# Patient Record
Sex: Female | Born: 1990 | Race: White | Hispanic: No | Marital: Single | Smoking: Current every day smoker
Health system: Southern US, Community
[De-identification: ages and names within clinical notes are randomized; demographics above are authoritative.]

## PROBLEM LIST (undated history)

## (undated) DIAGNOSIS — F32A Depression, unspecified: Secondary | ICD-10-CM

## (undated) DIAGNOSIS — F3281 Premenstrual dysphoric disorder: Secondary | ICD-10-CM

## (undated) DIAGNOSIS — F419 Anxiety disorder, unspecified: Secondary | ICD-10-CM

## (undated) DIAGNOSIS — N390 Urinary tract infection, site not specified: Secondary | ICD-10-CM

## (undated) DIAGNOSIS — F988 Other specified behavioral and emotional disorders with onset usually occurring in childhood and adolescence: Secondary | ICD-10-CM

## (undated) DIAGNOSIS — F319 Bipolar disorder, unspecified: Secondary | ICD-10-CM

## (undated) DIAGNOSIS — F909 Attention-deficit hyperactivity disorder, unspecified type: Secondary | ICD-10-CM

## (undated) DIAGNOSIS — F329 Major depressive disorder, single episode, unspecified: Secondary | ICD-10-CM

## (undated) HISTORY — DX: Major depressive disorder, single episode, unspecified: F32.9

## (undated) HISTORY — DX: Attention-deficit hyperactivity disorder, unspecified type: F90.9

## (undated) HISTORY — DX: Urinary tract infection, site not specified: N39.0

## (undated) HISTORY — DX: Bipolar disorder, unspecified: F31.9

## (undated) HISTORY — DX: Depression, unspecified: F32.A

## (undated) HISTORY — DX: Premenstrual dysphoric disorder: F32.81

## (undated) HISTORY — PX: TONSILLECTOMY: SUR1361

## (undated) HISTORY — DX: Other specified behavioral and emotional disorders with onset usually occurring in childhood and adolescence: F98.8

## (undated) HISTORY — DX: Anxiety disorder, unspecified: F41.9

---

## 2010-04-29 ENCOUNTER — Encounter: Payer: Self-pay | Admitting: Maternal & Fetal Medicine

## 2010-09-22 ENCOUNTER — Observation Stay: Payer: Self-pay

## 2010-10-18 ENCOUNTER — Inpatient Hospital Stay: Payer: Self-pay | Admitting: Obstetrics and Gynecology

## 2011-02-08 ENCOUNTER — Emergency Department: Payer: Self-pay | Admitting: *Deleted

## 2014-05-12 ENCOUNTER — Emergency Department (HOSPITAL_COMMUNITY): Payer: BLUE CROSS/BLUE SHIELD

## 2014-05-12 ENCOUNTER — Observation Stay (HOSPITAL_COMMUNITY): Payer: BLUE CROSS/BLUE SHIELD

## 2014-05-12 ENCOUNTER — Encounter (HOSPITAL_COMMUNITY): Payer: Self-pay | Admitting: Emergency Medicine

## 2014-05-12 ENCOUNTER — Observation Stay (HOSPITAL_COMMUNITY)
Admission: EM | Admit: 2014-05-12 | Discharge: 2014-05-14 | Disposition: A | Discharge status: Home / Self Care | Payer: BLUE CROSS/BLUE SHIELD | Attending: General Surgery | Admitting: General Surgery

## 2014-05-12 DIAGNOSIS — T07XXXA Unspecified multiple injuries, initial encounter: Secondary | ICD-10-CM

## 2014-05-12 DIAGNOSIS — M549 Dorsalgia, unspecified: Secondary | ICD-10-CM | POA: Insufficient documentation

## 2014-05-12 DIAGNOSIS — S62301A Unspecified fracture of second metacarpal bone, left hand, initial encounter for closed fracture: Secondary | ICD-10-CM | POA: Diagnosis not present

## 2014-05-12 DIAGNOSIS — D62 Acute posthemorrhagic anemia: Secondary | ICD-10-CM | POA: Diagnosis not present

## 2014-05-12 DIAGNOSIS — S92101A Unspecified fracture of right talus, initial encounter for closed fracture: Secondary | ICD-10-CM | POA: Diagnosis present

## 2014-05-12 DIAGNOSIS — S0081XA Abrasion of other part of head, initial encounter: Secondary | ICD-10-CM | POA: Diagnosis not present

## 2014-05-12 DIAGNOSIS — S22039A Unspecified fracture of third thoracic vertebra, initial encounter for closed fracture: Secondary | ICD-10-CM

## 2014-05-12 DIAGNOSIS — R52 Pain, unspecified: Secondary | ICD-10-CM

## 2014-05-12 DIAGNOSIS — S2231XA Fracture of one rib, right side, initial encounter for closed fracture: Secondary | ICD-10-CM | POA: Insufficient documentation

## 2014-05-12 DIAGNOSIS — T148XXA Other injury of unspecified body region, initial encounter: Secondary | ICD-10-CM

## 2014-05-12 DIAGNOSIS — S32029A Unspecified fracture of second lumbar vertebra, initial encounter for closed fracture: Principal | ICD-10-CM | POA: Insufficient documentation

## 2014-05-12 DIAGNOSIS — S32009A Unspecified fracture of unspecified lumbar vertebra, initial encounter for closed fracture: Secondary | ICD-10-CM | POA: Diagnosis present

## 2014-05-12 DIAGNOSIS — S92001A Unspecified fracture of right calcaneus, initial encounter for closed fracture: Secondary | ICD-10-CM | POA: Diagnosis not present

## 2014-05-12 DIAGNOSIS — S32039A Unspecified fracture of third lumbar vertebra, initial encounter for closed fracture: Secondary | ICD-10-CM

## 2014-05-12 DIAGNOSIS — S2239XA Fracture of one rib, unspecified side, initial encounter for closed fracture: Secondary | ICD-10-CM

## 2014-05-12 LAB — CBC WITH DIFFERENTIAL/PLATELET
Basophils Absolute: 0 10*3/uL (ref 0.0–0.1)
Basophils Relative: 0 % (ref 0–1)
Eosinophils Absolute: 0.1 10*3/uL (ref 0.0–0.7)
Eosinophils Relative: 1 % (ref 0–5)
HCT: 40.1 % (ref 36.0–46.0)
Hemoglobin: 13.4 g/dL (ref 12.0–15.0)
LYMPHS ABS: 4 10*3/uL (ref 0.7–4.0)
Lymphocytes Relative: 27 % (ref 12–46)
MCH: 24.5 pg — AB (ref 26.0–34.0)
MCHC: 33.4 g/dL (ref 30.0–36.0)
MCV: 73.3 fL — AB (ref 78.0–100.0)
MONO ABS: 0.6 10*3/uL (ref 0.1–1.0)
MONOS PCT: 4 % (ref 3–12)
NEUTROS ABS: 9.9 10*3/uL — AB (ref 1.7–7.7)
Neutrophils Relative %: 68 % (ref 43–77)
Platelets: 159 10*3/uL (ref 150–400)
RBC: 5.47 MIL/uL — ABNORMAL HIGH (ref 3.87–5.11)
RDW: 15.8 % — ABNORMAL HIGH (ref 11.5–15.5)
WBC: 14.6 10*3/uL — AB (ref 4.0–10.5)

## 2014-05-12 LAB — I-STAT BETA HCG BLOOD, ED (MC, WL, AP ONLY): I-stat hCG, quantitative: <5 m[IU]/mL (ref <5)

## 2014-05-12 LAB — COMPREHENSIVE METABOLIC PANEL
ALK PHOS: 77 U/L (ref 39–117)
ALT: 49 U/L — ABNORMAL HIGH (ref 0–35)
ANION GAP: 7 (ref 5–15)
AST: 78 U/L — ABNORMAL HIGH (ref 0–37)
Albumin: 3.8 g/dL (ref 3.5–5.2)
BILIRUBIN TOTAL: 0.5 mg/dL (ref 0.3–1.2)
BUN: 10 mg/dL (ref 6–23)
CHLORIDE: 107 mmol/L (ref 96–112)
CO2: 25 mmol/L (ref 19–32)
Calcium: 8.8 mg/dL (ref 8.4–10.5)
Creatinine, Ser: 0.78 mg/dL (ref 0.50–1.10)
GFR calc Af Amer: >90 mL/min (ref >90)
Glucose, Bld: 130 mg/dL — ABNORMAL HIGH (ref 70–99)
POTASSIUM: 3.5 mmol/L (ref 3.5–5.1)
Sodium: 139 mmol/L (ref 135–145)
Total Protein: 6.6 g/dL (ref 6.0–8.3)

## 2014-05-12 LAB — ETHANOL: Alcohol, Ethyl (B): <5 mg/dL (ref 0–9)

## 2014-05-12 LAB — PROTIME-INR
INR: 0.99 (ref 0.00–1.49)
PROTHROMBIN TIME: 13.2 s (ref 11.6–15.2)

## 2014-05-12 LAB — SAMPLE TO BLOOD BANK

## 2014-05-12 MED ORDER — TETANUS-DIPHTH-ACELL PERTUSSIS 5-2.5-18.5 LF-MCG/0.5 IM SUSP
0.5000 mL | Freq: Once | INTRAMUSCULAR | Status: AC
  Administered 2014-05-12: 0.5 mL via INTRAMUSCULAR

## 2014-05-12 MED ORDER — AMPHETAMINE-DEXTROAMPHET ER 10 MG PO CP24
20.0000 mg | ORAL_CAPSULE | Freq: Every day | ORAL | Status: DC
  Administered 2014-05-13 – 2014-05-14 (×2): 20 mg via ORAL
  Filled 2014-05-12 (×2): qty 2

## 2014-05-12 MED ORDER — IOHEXOL 300 MG/ML  SOLN
80.0000 mL | Freq: Once | INTRAMUSCULAR | Status: AC | PRN
  Administered 2014-05-12: 80 mL via INTRAVENOUS

## 2014-05-12 MED ORDER — MORPHINE SULFATE 4 MG/ML IJ SOLN
4.0000 mg | Freq: Once | INTRAMUSCULAR | Status: AC
  Administered 2014-05-12: 4 mg via INTRAVENOUS
  Filled 2014-05-12: qty 1

## 2014-05-12 MED ORDER — ENOXAPARIN SODIUM 40 MG/0.4ML ~~LOC~~ SOLN
40.0000 mg | SUBCUTANEOUS | Status: DC
  Administered 2014-05-12 – 2014-05-14 (×3): 40 mg via SUBCUTANEOUS
  Filled 2014-05-12 (×4): qty 0.4

## 2014-05-12 MED ORDER — ONDANSETRON HCL 4 MG/2ML IJ SOLN: 4.0000 mg | Freq: Four times a day (QID) | INTRAMUSCULAR | Status: DC | PRN

## 2014-05-12 MED ORDER — ONDANSETRON HCL 4 MG PO TABS: 4.0000 mg | ORAL_TABLET | Freq: Four times a day (QID) | ORAL | Status: DC | PRN

## 2014-05-12 MED ORDER — PANTOPRAZOLE SODIUM 40 MG IV SOLR
40.0000 mg | Freq: Every day | INTRAVENOUS | Status: DC
  Administered 2014-05-12: 40 mg via INTRAVENOUS
  Filled 2014-05-12: qty 40

## 2014-05-12 MED ORDER — KETOROLAC TROMETHAMINE 15 MG/ML IJ SOLN
15.0000 mg | Freq: Four times a day (QID) | INTRAMUSCULAR | Status: AC
  Administered 2014-05-12 – 2014-05-14 (×8): 15 mg via INTRAVENOUS
  Filled 2014-05-12 (×8): qty 1

## 2014-05-12 MED ORDER — ACETAMINOPHEN 325 MG PO TABS: 650.0000 mg | ORAL_TABLET | ORAL | Status: DC | PRN

## 2014-05-12 MED ORDER — KETOROLAC TROMETHAMINE 30 MG/ML IJ SOLN
30.0000 mg | Freq: Once | INTRAMUSCULAR | Status: AC
  Administered 2014-05-12: 30 mg via INTRAVENOUS
  Filled 2014-05-12: qty 1

## 2014-05-12 MED ORDER — BISACODYL 10 MG RE SUPP: 10.0000 mg | Freq: Every day | RECTAL | Status: DC | PRN

## 2014-05-12 MED ORDER — OXYCODONE HCL 5 MG PO TABS
10.0000 mg | ORAL_TABLET | ORAL | Status: DC | PRN
  Administered 2014-05-12 – 2014-05-14 (×10): 10 mg via ORAL
  Filled 2014-05-12 (×10): qty 2

## 2014-05-12 MED ORDER — MORPHINE SULFATE 2 MG/ML IJ SOLN
INTRAMUSCULAR | Status: AC
  Filled 2014-05-12: qty 2

## 2014-05-12 MED ORDER — FENTANYL CITRATE 0.05 MG/ML IJ SOLN
INTRAMUSCULAR | Status: AC
  Filled 2014-05-12: qty 2

## 2014-05-12 MED ORDER — SODIUM CHLORIDE 0.9 % IV SOLN
INTRAVENOUS | Status: DC
  Administered 2014-05-12 – 2014-05-13 (×2): via INTRAVENOUS

## 2014-05-12 MED ORDER — PANTOPRAZOLE SODIUM 40 MG PO TBEC
40.0000 mg | DELAYED_RELEASE_TABLET | Freq: Every day | ORAL | Status: DC
  Administered 2014-05-13 – 2014-05-14 (×2): 40 mg via ORAL
  Filled 2014-05-12 (×2): qty 1

## 2014-05-12 MED ORDER — TETANUS-DIPHTH-ACELL PERTUSSIS 5-2.5-18.5 LF-MCG/0.5 IM SUSP
INTRAMUSCULAR | Status: AC
  Administered 2014-05-12: 0.5 mL via INTRAMUSCULAR
  Filled 2014-05-12: qty 0.5

## 2014-05-12 MED ORDER — FENTANYL CITRATE 0.05 MG/ML IJ SOLN
50.0000 ug | Freq: Once | INTRAMUSCULAR | Status: AC
  Administered 2014-05-12: 50 ug via INTRAVENOUS

## 2014-05-12 MED ORDER — HYDROMORPHONE HCL 1 MG/ML IJ SOLN
1.0000 mg | INTRAMUSCULAR | Status: DC | PRN
  Administered 2014-05-12: 2 mg via INTRAVENOUS
  Filled 2014-05-12: qty 2

## 2014-05-12 MED ORDER — OXYCODONE HCL 5 MG PO TABS: 5.0000 mg | ORAL_TABLET | ORAL | Status: DC | PRN

## 2014-05-12 MED ORDER — DOCUSATE SODIUM 100 MG PO CAPS
100.0000 mg | ORAL_CAPSULE | Freq: Two times a day (BID) | ORAL | Status: DC
  Administered 2014-05-12 – 2014-05-14 (×4): 100 mg via ORAL
  Filled 2014-05-12 (×4): qty 1

## 2014-05-12 NOTE — ED Notes (Signed)
CT attempted to pick up patient, beta HCG still processing.  To call CT when results are in.

## 2014-05-12 NOTE — Progress Notes (Signed)
Talus fracture nonop.  Will order splint.  NWB RLE. Left 2nd metacarpal fx - will attempt nonop treatment.  Splint for now. Platform weight bearing.  F/u in office in 1 week for repeat xrays.  Mayra ReelN. Michael Xu, MD [facility] [phone] 5:56 PM

## 2014-05-12 NOTE — Progress Notes (Signed)
Pt reviewed by Dr Lindie SpruceWyatt at 2000 and a TLSO Brace was ordered, Ortho Tech paged and notified who promised to order in the morning for the pt to be fitted, will also come and splint pt's right ankle and left wrist, pt reassured, will however continue to monitor,call light within pt's reach, family at bedside. Obasogie-Asidi, Micole Delehanty Efe
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
## 2014-05-12 NOTE — ED Notes (Signed)
Portable xray at bedside.

## 2014-05-12 NOTE — Progress Notes (Signed)
Orthopedic Tech Progress Note Patient Details:  [name] GingerCheryl [name] Feb 15, 1991 [id number]  Ortho Devices Type of Ortho Device: Ace wrap, Volar splint, Lenora BoysWatson Jones splint Ortho Device/Splint Location: LUE dorsal volar splint, RLE watson jones Ortho Device/Splint Interventions: Ordered, Application   Jennye MoccasinHughes, Corry Ihnen Craig 05/12/2014, 10:12 PM
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
## 2014-05-12 NOTE — ED Notes (Signed)
Patient transported to X-ray 
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
## 2014-05-13 ENCOUNTER — Encounter (HOSPITAL_COMMUNITY): Payer: Self-pay | Admitting: *Deleted

## 2014-05-13 DIAGNOSIS — S32029A Unspecified fracture of second lumbar vertebra, initial encounter for closed fracture: Secondary | ICD-10-CM | POA: Diagnosis not present

## 2014-05-13 LAB — CBC
HCT: 33.5 % — ABNORMAL LOW (ref 36.0–46.0)
Hemoglobin: 11 g/dL — ABNORMAL LOW (ref 12.0–15.0)
MCH: 24.1 pg — AB (ref 26.0–34.0)
MCHC: 32.8 g/dL (ref 30.0–36.0)
MCV: 73.5 fL — AB (ref 78.0–100.0)
Platelets: 121 10*3/uL — ABNORMAL LOW (ref 150–400)
RBC: 4.56 MIL/uL (ref 3.87–5.11)
RDW: 15.9 % — ABNORMAL HIGH (ref 11.5–15.5)
WBC: 7.1 10*3/uL (ref 4.0–10.5)

## 2014-05-13 LAB — BASIC METABOLIC PANEL
Anion gap: 6 (ref 5–15)
BUN: <5 mg/dL — ABNORMAL LOW (ref 6–23)
CHLORIDE: 106 mmol/L (ref 96–112)
CO2: 24 mmol/L (ref 19–32)
Calcium: 7.7 mg/dL — ABNORMAL LOW (ref 8.4–10.5)
Creatinine, Ser: 0.58 mg/dL (ref 0.50–1.10)
GFR calc Af Amer: >90 mL/min (ref >90)
GFR calc non Af Amer: >90 mL/min (ref >90)
Glucose, Bld: 95 mg/dL (ref 70–99)
Potassium: 3.8 mmol/L (ref 3.5–5.1)
Sodium: 136 mmol/L (ref 135–145)

## 2014-05-13 MED ORDER — METHOCARBAMOL 750 MG PO TABS
750.0000 mg | ORAL_TABLET | Freq: Three times a day (TID) | ORAL | Status: DC | PRN
  Administered 2014-05-13 – 2014-05-14 (×2): 750 mg via ORAL
  Filled 2014-05-13 (×2): qty 1

## 2014-05-13 MED ORDER — INFLUENZA VAC SPLIT QUAD 0.5 ML IM SUSY
0.5000 mL | PREFILLED_SYRINGE | INTRAMUSCULAR | Status: DC
  Filled 2014-05-13: qty 0.5

## 2014-05-13 MED ORDER — HYDROMORPHONE HCL 1 MG/ML IJ SOLN: 1.0000 mg | INTRAMUSCULAR | Status: DC | PRN

## 2014-05-13 MED ORDER — BACITRACIN-NEOMYCIN-POLYMYXIN OINTMENT TUBE
1.0000 "application " | TOPICAL_OINTMENT | Freq: Two times a day (BID) | CUTANEOUS | Status: DC
  Administered 2014-05-13 (×2): 1 via TOPICAL
  Filled 2014-05-13: qty 15
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
## 2014-05-13 NOTE — Progress Notes (Signed)
UR completed 
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
## 2014-05-14 DIAGNOSIS — T07XXXA Unspecified multiple injuries, initial encounter: Secondary | ICD-10-CM

## 2014-05-14 DIAGNOSIS — S0081XA Abrasion of other part of head, initial encounter: Secondary | ICD-10-CM | POA: Diagnosis present

## 2014-05-14 DIAGNOSIS — S32029A Unspecified fracture of second lumbar vertebra, initial encounter for closed fracture: Secondary | ICD-10-CM | POA: Diagnosis not present

## 2014-05-14 DIAGNOSIS — S92101A Unspecified fracture of right talus, initial encounter for closed fracture: Secondary | ICD-10-CM | POA: Diagnosis present

## 2014-05-14 DIAGNOSIS — S92001A Unspecified fracture of right calcaneus, initial encounter for closed fracture: Secondary | ICD-10-CM | POA: Diagnosis present

## 2014-05-14 DIAGNOSIS — S62301A Unspecified fracture of second metacarpal bone, left hand, initial encounter for closed fracture: Secondary | ICD-10-CM | POA: Diagnosis present

## 2014-05-14 LAB — CBC
HEMATOCRIT: 32.8 % — AB (ref 36.0–46.0)
Hemoglobin: 10.8 g/dL — ABNORMAL LOW (ref 12.0–15.0)
MCH: 24.1 pg — AB (ref 26.0–34.0)
MCHC: 32.9 g/dL (ref 30.0–36.0)
MCV: 73.2 fL — AB (ref 78.0–100.0)
Platelets: 111 10*3/uL — ABNORMAL LOW (ref 150–400)
RBC: 4.48 MIL/uL (ref 3.87–5.11)
RDW: 15.7 % — AB (ref 11.5–15.5)
WBC: 6.9 10*3/uL (ref 4.0–10.5)

## 2014-05-14 MED ORDER — DOCUSATE SODIUM 100 MG PO CAPS: 100.0000 mg | ORAL_CAPSULE | Freq: Two times a day (BID) | ORAL | Status: DC | PRN

## 2014-05-14 MED ORDER — ACETAMINOPHEN 325 MG PO TABS: 650.0000 mg | ORAL_TABLET | ORAL | Status: DC | PRN

## 2014-05-14 MED ORDER — OXYCODONE HCL 5 MG PO TABS: 5.0000 mg | ORAL_TABLET | Freq: Four times a day (QID) | ORAL | Status: DC | PRN

## 2014-05-14 MED ORDER — METHOCARBAMOL 750 MG PO TABS: 750.0000 mg | ORAL_TABLET | Freq: Three times a day (TID) | ORAL | Status: DC | PRN
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
## 2014-05-14 NOTE — Progress Notes (Signed)
Patient ready for discharge home today; equipment delivered to room; discharge instructions given and reviewed with patient; Rx's and notes for work and childcare given to patient; patient discharged accompanied by her friend.
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
## 2014-05-14 NOTE — Discharge Instructions (Signed)
Continue left hand and right lower leg splints.   Follow up with Ortho (1week) and Neurosurgery (3 weeks) as recommended. Wear your turtle shell brace, don and doff in bed prior to ambulating Take a stool softener as needed.  Thoracolumbosacral Orthosis Brace A thoracolumbosacral orthosis (TLSO) brace can be worn for different purposes. A TLSO brace can be worn to support the back. Your back may need more support if you had a broken bone in your back (fractured vertebrae), back surgery, or you cannot move (paralysis) your torso muscles. A TLSO brace can also be worn by a child to help prevent curving back problems from getting worse as the child grows. TLSO braces are used to limit bending and twisting. The braces are made from a hard plastic. They are custom fit for each wearer. The TLSO brace covers both the front and back of the torso. On the back, the brace reaches from just above the tailbone to just below the shoulder blades. On the front, the brace covers the ribs and often reaches past the waist and over the hips. The brace can be worn under clothing.  HOME CARE INSTRUCTIONS  Ask your caregiver if you can remove your brace when showering or sleeping.  Follow your caregiver's instructions for putting on your brace.  Follow your caregiver's instructions about how much weight you can lift.  Always wear a T-shirt under the brace.  Make sure the brace is always well-aligned and fits you closely.  Check your skin daily for sores and redness caused by rubbing or pressure.  If you feel unsteady, use a cane or walker for support until you feel more steady.  Sit in high, firm chairs. It may be difficult to stand up from low, soft chairs.  Keep all follow-up appointments as directed by your caregiver. SEEK IMMEDIATE MEDICAL CARE IF:  You have a fever.  You have shortness of breath.  You have chest pain.  You have numbness, tingling, or pain. Developed in conjunction with the  [facility]. Document Released: 01/23/2011 Document Revised: 04/28/2011 Document Reviewed: 01/23/2011 [facility] Patient Information 2015 [place] [place]. This information is not intended to replace advice given to you by your health care provider. Make sure you discuss any questions you have with your health care provider.
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
## 2014-08-08 ENCOUNTER — Ambulatory Visit: Payer: Self-pay | Admitting: Psychiatry

## 2014-09-07 ENCOUNTER — Encounter: Payer: Self-pay | Admitting: Psychiatry

## 2014-09-07 ENCOUNTER — Ambulatory Visit (INDEPENDENT_AMBULATORY_CARE_PROVIDER_SITE_OTHER): Payer: BLUE CROSS/BLUE SHIELD | Admitting: Psychiatry

## 2014-09-07 ENCOUNTER — Ambulatory Visit (INDEPENDENT_AMBULATORY_CARE_PROVIDER_SITE_OTHER): Payer: BLUE CROSS/BLUE SHIELD | Admitting: Licensed Clinical Social Worker

## 2014-09-07 VITALS — BP 110/72 | HR 78 | Temp 97.8°F | Ht 62.0 in | Wt 160.8 lb

## 2014-09-07 DIAGNOSIS — F3181 Bipolar II disorder: Secondary | ICD-10-CM

## 2014-09-07 DIAGNOSIS — F39 Unspecified mood [affective] disorder: Secondary | ICD-10-CM | POA: Insufficient documentation

## 2014-09-07 MED ORDER — ARIPIPRAZOLE 15 MG PO TABS: 15.0000 mg | ORAL_TABLET | Freq: Every day | ORAL | Status: DC
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
## 2014-09-08 DIAGNOSIS — F3181 Bipolar II disorder: Secondary | ICD-10-CM | POA: Insufficient documentation

## 2014-09-14 ENCOUNTER — Ambulatory Visit (INDEPENDENT_AMBULATORY_CARE_PROVIDER_SITE_OTHER): Payer: BLUE CROSS/BLUE SHIELD | Admitting: Licensed Clinical Social Worker

## 2014-09-14 ENCOUNTER — Other Ambulatory Visit
Admission: RE | Admit: 2014-09-14 | Discharge: 2014-09-14 | Disposition: A | Discharge status: Home / Self Care | Payer: BLUE CROSS/BLUE SHIELD | Source: Ambulatory Visit | Attending: Psychiatry | Admitting: Psychiatry

## 2014-09-14 DIAGNOSIS — F319 Bipolar disorder, unspecified: Secondary | ICD-10-CM | POA: Diagnosis not present

## 2014-09-14 DIAGNOSIS — F3181 Bipolar II disorder: Secondary | ICD-10-CM | POA: Diagnosis not present

## 2014-09-14 LAB — LIPID PANEL
CHOL/HDL RATIO: 3 ratio
Cholesterol: 153 mg/dL (ref 0–200)
HDL: 51 mg/dL (ref >40)
LDL CALC: 88 mg/dL (ref 0–99)
TRIGLYCERIDES: 69 mg/dL (ref <150)
VLDL: 14 mg/dL (ref 0–40)

## 2014-09-14 LAB — GLUCOSE, RANDOM: Glucose, Bld: 92 mg/dL (ref 65–99)

## 2014-09-14 NOTE — Progress Notes (Signed)
THERAPIST PROGRESS NOTE  Session Time: 10:55 a.m. -   Participation Level: Active  Behavioral Response: somewhat provactive clothing; skin esposureAlertEuthymic  Type of Therapy: Individual Therapy  Treatment Goals addressed: Coping  Interventions: CBT, Motivational Interviewing, Solution Focused, Strength-based, Supportive and Reframing  Summary: [name] [name] is a 24 y.o. female who presents with diagnosis as per Dr. Jimmye Norman of Bi-Polar II Disorder.  Client reports feeling more tired along with "I am near binge eating" and increased appetite, having back aches and stated "I don't have a lot of energy."  She started Abilify three days ago.  "I feel an evenness with my temperament. I'm finding my-self reaction less to things." "I haven't had any freak outs." Is hoping that she can restart Adderall soon due to difficulty with focusing.  She discussed how she has been an emotional eater in the past.  Denied feeling depressed yet continues to have interrupted sleep and may get 6 hours of sleep at night.  Overall, she was more slowed in her movements and speech somewhat less pressured yet she remains hyper-verbal during session and thoughts are still somewhat tangential.  Denies feeling either depressed or manic.  Reported that it is her reaction to things in her parents' home that have resulted in negative experiences which led her to begin talking to therapist  During session she was more positive about living situation with her parents compared to other sessions.  Client discussed relationships with Thurmond Butts, boyfriend who is the only father figure that son, Dory Larsen, knows since the biological father does not support the son. Malachy Mood reviewed with LCSW those areas of her life that are causing internal stress as it relates to how she is getting her needs met through self described 2 and a half boyfriends.  She reported that she is only sexually active with the one whom is the father figure for  her son and whom she has been in a relationship with for around four years now.  Jena reports that the other two are more less there for her to "complain to" and for emotional support and attention.  She discussed her own motives in having multiple relationships and is motivated to change this pattern of behaviors and Zelena discussed increasing insight into how this is having a negative impact on her life.  "I have three goals: To get an attorney to get sole custody of my son, to rip my teeth out or get a reliable vehicle."  Client asking LCSW for input stating she doesn't know which goal to take care of first.  Maily was receptive to LCSW's support and feedback today.  Suicidal/Homicidal: Negativewithout intent/plan  Therapist Response:   Supportive therapy with insight was provided and validated client's desires to make changes in her life and promote healthier inter-personal relationships.  Reassured her that therapy could focus on identifying toxic characteristics both in herself and in others in her life along with the role of certain negative thinking patterns with emphasis on re-framing what is believed. Assisted client to begin to increase insight and identification into patterns of certain behaviors and the resulting consequences.  Reinforced importance of self-care and urged to have her teeth/dental problems taken care of first to prevent further health problems.  Plan: Return again in two weeks.  Client will complete readings/hand-outs for discussion at next session.  LCSW will assess additional therapy goals/needs and coordinate client's care with Dr. Jimmye Norman.  Diagnosis: Bi-Polar 2 Disorder   Rule Out Borderline Personality Disorder    Otila Kluver  Nani Skillern 09/14/2014

## 2014-09-15 LAB — PROLACTIN: Prolactin: 4.1 ng/mL — ABNORMAL LOW (ref 4.8–23.3)

## 2014-10-12 ENCOUNTER — Ambulatory Visit (INDEPENDENT_AMBULATORY_CARE_PROVIDER_SITE_OTHER): Payer: BLUE CROSS/BLUE SHIELD | Admitting: Licensed Clinical Social Worker

## 2014-10-12 ENCOUNTER — Ambulatory Visit (INDEPENDENT_AMBULATORY_CARE_PROVIDER_SITE_OTHER): Payer: BLUE CROSS/BLUE SHIELD | Admitting: Psychiatry

## 2014-10-12 ENCOUNTER — Ambulatory Visit: Payer: Self-pay | Admitting: Family Medicine

## 2014-10-12 ENCOUNTER — Encounter: Payer: Self-pay | Admitting: Psychiatry

## 2014-10-12 VITALS — BP 122/78 | HR 100 | Temp 98.5°F | Ht 62.0 in | Wt 167.2 lb

## 2014-10-12 DIAGNOSIS — F900 Attention-deficit hyperactivity disorder, predominantly inattentive type: Secondary | ICD-10-CM

## 2014-10-12 DIAGNOSIS — M545 Low back pain, unspecified: Secondary | ICD-10-CM | POA: Insufficient documentation

## 2014-10-12 DIAGNOSIS — IMO0001 Reserved for inherently not codable concepts without codable children: Secondary | ICD-10-CM | POA: Insufficient documentation

## 2014-10-12 DIAGNOSIS — N760 Acute vaginitis: Secondary | ICD-10-CM | POA: Insufficient documentation

## 2014-10-12 DIAGNOSIS — J309 Allergic rhinitis, unspecified: Secondary | ICD-10-CM | POA: Insufficient documentation

## 2014-10-12 DIAGNOSIS — I889 Nonspecific lymphadenitis, unspecified: Secondary | ICD-10-CM | POA: Insufficient documentation

## 2014-10-12 DIAGNOSIS — B354 Tinea corporis: Secondary | ICD-10-CM | POA: Insufficient documentation

## 2014-10-12 DIAGNOSIS — J019 Acute sinusitis, unspecified: Secondary | ICD-10-CM | POA: Insufficient documentation

## 2014-10-12 DIAGNOSIS — J020 Streptococcal pharyngitis: Secondary | ICD-10-CM | POA: Insufficient documentation

## 2014-10-12 DIAGNOSIS — N946 Dysmenorrhea, unspecified: Secondary | ICD-10-CM | POA: Insufficient documentation

## 2014-10-12 DIAGNOSIS — K529 Noninfective gastroenteritis and colitis, unspecified: Secondary | ICD-10-CM | POA: Insufficient documentation

## 2014-10-12 DIAGNOSIS — L739 Follicular disorder, unspecified: Secondary | ICD-10-CM | POA: Insufficient documentation

## 2014-10-12 DIAGNOSIS — F329 Major depressive disorder, single episode, unspecified: Secondary | ICD-10-CM | POA: Insufficient documentation

## 2014-10-12 DIAGNOSIS — F4322 Adjustment disorder with anxiety: Secondary | ICD-10-CM | POA: Insufficient documentation

## 2014-10-12 DIAGNOSIS — J111 Influenza due to unidentified influenza virus with other respiratory manifestations: Secondary | ICD-10-CM | POA: Insufficient documentation

## 2014-10-12 DIAGNOSIS — F3181 Bipolar II disorder: Secondary | ICD-10-CM

## 2014-10-12 DIAGNOSIS — F32A Depression, unspecified: Secondary | ICD-10-CM | POA: Insufficient documentation

## 2014-10-12 DIAGNOSIS — R350 Frequency of micturition: Secondary | ICD-10-CM | POA: Insufficient documentation

## 2014-10-12 DIAGNOSIS — M19171 Post-traumatic osteoarthritis, right ankle and foot: Secondary | ICD-10-CM | POA: Insufficient documentation

## 2014-10-12 DIAGNOSIS — G4700 Insomnia, unspecified: Secondary | ICD-10-CM | POA: Insufficient documentation

## 2014-10-12 DIAGNOSIS — S62609A Fracture of unspecified phalanx of unspecified finger, initial encounter for closed fracture: Secondary | ICD-10-CM | POA: Insufficient documentation

## 2014-10-12 DIAGNOSIS — L709 Acne, unspecified: Secondary | ICD-10-CM | POA: Insufficient documentation

## 2014-10-12 DIAGNOSIS — N390 Urinary tract infection, site not specified: Secondary | ICD-10-CM | POA: Insufficient documentation

## 2014-10-12 DIAGNOSIS — B349 Viral infection, unspecified: Secondary | ICD-10-CM | POA: Insufficient documentation

## 2014-10-12 MED ORDER — AMPHETAMINE-DEXTROAMPHET ER 10 MG PO CP24: 10.0000 mg | ORAL_CAPSULE | ORAL | Status: DC

## 2014-10-12 MED ORDER — ARIPIPRAZOLE 15 MG PO TABS: 15.0000 mg | ORAL_TABLET | Freq: Every day | ORAL | Status: DC

## 2014-10-12 NOTE — Progress Notes (Signed)
BH MD/PA/NP OP Progress Note  10/12/2014 11:03 AM [name] [name]  MRN:  [id number]  Subjective:  Patient returns a follow-up or bipolar 2 and ADHD. She states she feels "even." She states since being on the Abilify at her visit one month ago she feels like her mood is been even she's not up she's not down. She states the only problem she continues to have is not completing things and not being able to remain focus. She states her sleep is good. States her mood is good. She states today her focus will be parent for her son's birthday party which they're having this weekend even though his birthdate is actually on [birth year]. We discussed her metabolic labs which were all within normal limits. She denies any side effects on the medications. She is interested in restarting her stimulant and feels like this has been helpful to her in the past. We discussed taking note to any symptoms that it could be making her more irritable and agitated related to bipolar and she was aware of this. Chief Complaint:  Chief Complaint    Follow-up; Medication Refill     Visit Diagnosis:  No diagnosis found.  Past Medical History:  Past Medical History  Diagnosis Date  . ADHD (attention deficit hyperactivity disorder)   . Depression   . Anxiety   . Bipolar disorder     Past Surgical History  Procedure Laterality Date  . Tonsillectomy     Family History:  Family History  Problem Relation Age of Onset  . Hypertension Mother   . Diabetes Mother   . Anxiety disorder Mother   . Asthma Mother   . Diabetes Father   . Hypertension Father   . Alcohol abuse Father   . Depression Father   . Arthritis Father   . Arthritis Sister   . Anxiety disorder Brother   . Hypertension Brother   . Arthritis Sister   . Arthritis Sister   . Asthma Sister    Social History:  Social History   Social History  . Marital Status: Single    Spouse Name: N/A  . Number of Children: N/A  . Years of Education: N/A   Social  History Main Topics  . Smoking status: Current Every Day Smoker -- 0.50 packs/day    Types: Cigarettes    Start date: 09/06/2009  . Smokeless tobacco: Never Used  . Alcohol Use: 2.4 oz/week    0 Standard drinks or equivalent, 4 Glasses of wine per week  . Drug Use: No  . Sexual Activity: Yes    Birth Control/ Protection: Implant   Other Topics Concern  . None   Social History Narrative   Additional History:   Assessment:   Musculoskeletal: Strength & Muscle Tone: within normal limits Gait & Station: normal Patient leans: N/A  Psychiatric Specialty Exam: HPI  Review of Systems  Psychiatric/Behavioral: Negative for depression, suicidal ideas, hallucinations, memory loss and substance abuse. The patient is not nervous/anxious and does not have insomnia.     Blood pressure 122/78, pulse 100, temperature 98.5 F (36.9 C), temperature source Tympanic, height 5\' 2"  (1.575 m), weight 167 lb 3.2 oz (75.841 kg), SpO2 97 %.Body mass index is 30.57 kg/(m^2).  General Appearance: Neat and Well Groomed  Eye Contact:  Good  Speech:  Normal Rate  Volume:  Normal  Mood:  "Even"  Affect:  bright, euthymic  Thought Process:  Linear and Logical  Orientation:  Full (Time, Place, and Person)  Thought Content:  Negative  Suicidal Thoughts:  No  Homicidal Thoughts:  No  Memory:  Immediate;   Good Recent;   Good Remote;   Good  Judgement:  Good  Insight:  Good  Psychomotor Activity:  Negative  Concentration:  Good  Recall:  Good  Fund of Knowledge: Good  Language: Good  Akathisia:  Negative  Handed:  Right  AIMS (if indicated): Done today, normal  Assets:  Communication Skills Desire for Improvement Physical Health Social Support  ADL's:  Intact  Cognition: WNL  Sleep:  good   Is the patient at risk to self?  No. Has the patient been a risk to self in the past 6 months?  No. Has the patient been a risk to self within the distant past?  No. Is the patient a risk to others?   No. Has the patient been a risk to others in the past 6 months?  No. Has the patient been a risk to others within the distant past?  No.  Current Medications: Current Outpatient Prescriptions  Medication Sig Dispense Refill  . ARIPiprazole (ABILIFY) 15 MG tablet Take 1 tablet (15 mg total) by mouth daily. 30 tablet 2  . amphetamine-dextroamphetamine (ADDERALL XR) 10 MG 24 hr capsule Take 1 capsule (10 mg total) by mouth every morning. 30 capsule 0   No current facility-administered medications for this visit.    Medical Decision Making:  Established Problem, Stable/Improving (1) and Review of Medication Regimen & Side Effects (2)  Treatment Plan Summary:Medication management and Plan Patient reports good mood stability on Abilify 15 mg. We will continue the patient at this dose. Given that we have been able to assess her tolerability and effectiveness on this medication we will restart her stimulant Adderall XR 10 mg in the morning. Patient will follow up in 1 month. She's been encouraged call any questions or concerns prior to her next appointment. She will continue her therapy which will be important for certain personality traits.   Wallace Going 10/12/2014, 11:03 AM

## 2014-10-12 NOTE — Progress Notes (Signed)
THERAPIST PROGRESS NOTE  Session Time: 11:20 a.m. - 12:20 p.m.  Participation Level: Active  Behavioral Response: CasualAlertAnxious and Euthymic  Type of Therapy: Individual Therapy  Treatment Goals addressed: Coping  Interventions: CBT, Solution Focused, Strength-based, Supportive and Reframing  Summary: [name] [name] is a 24 y.o. female who presents with improvement in mood swings and depression per her report.  In terms of medications, client indicated "It's evening me out." Saw Dr. Mayford Knife prior to this visit. "I"m good, I'm starting to figure out where I'm at. I'm focused on one of the goals that I want which is to get a new job." [name] [name] talked about her fear of change and new situations. Feeling confused about what she is qualified to do occupationally. Feels stressed by family and support people stating "Their pushing me isn't helping me."  Good insight that she her pace of doing things is not likely the same pace as others with a comment "I just have to take my time."  Her foot is not healing well due to being on her feet upwards of 7-8 hours a day in her current job.  "That's why I want a new job where I can use my brain."  She reported an improvement in focus and management of Bi-Polar symptoms. Described family and boyfriend as "pushing" her to change jobs and she is feeling bad about herself.  "I like to be organized and prepared. I've learned to work these fears into something positive and productive."  She has Thursdays and Sundays off.  Some increase in sleeping when she is not at work.  "I need that rest for my foot so I can go to work."   Additional issue focused on in session is that she is losing interests in the relationships that she has and believes this is due to her moods stabilizing.  "It's tiring."  She has been in one relationship for nearly four years and this man is committed to being a father to her almost four year old son despite not being his biological  father while the other relationship she is in is described as meeting more of her emotional and physical needs.  "I don't know what I want. I wish I could take the two of them and lump them together to make one person."  [name] [name] remains motivated to participate in OPT and to engage in the change process.  She is receptive to input and written materials from LCSW and today's recommendations including making an appointment with Vocational Rehabilitation.  Goal:  "Start something. To get further in this job hunt."  Address relationship issues since feelings have changed regarding   Suicidal/Homicidal: Negativewithout intent/plan  Therapist Response: Discussed boundary setting to avoid over-extending herself physically and emotionally in relationships. Supportive therapy with insight was provided and validated client's desires to make changes in her life and promote healthier inter-personal relationships.  Reinforced importance of self-care and commended on goal setting.  LCSW suggested client make an appointment to speak with someone both [facility] and Vocational Rehabilitation in effort to do some job assessments to assist client to determine various occupations that she could receive training to do or with skills that are transferable.  In terms of relationships, LCSW suggested that taking a personal inventory of her own values and preferences in a relationships and use of a pros and cons list may provide more insight into which, if either, relationship is worth maintaining.  Also gently pointed out that neither partner is being given a fair  opportunity at this time since there is competition that the other is not aware of.  Plan: Return again in two weeks.  Client will complete readings/hand-outs for discussion at next session.  LCSW will assess additional therapy goals/needs and coordinate client's care with Dr. Mayford Knife.  Diagnosis: Bi-Polar 2 Disorder   ADHD   Rule Out Borderline Personality  Disorder   [name] Jan, LCSW 10/12/2014

## 2014-10-13 DIAGNOSIS — F900 Attention-deficit hyperactivity disorder, predominantly inattentive type: Secondary | ICD-10-CM | POA: Insufficient documentation

## 2014-10-20 ENCOUNTER — Encounter: Payer: Self-pay | Admitting: Family Medicine

## 2014-10-20 ENCOUNTER — Ambulatory Visit (INDEPENDENT_AMBULATORY_CARE_PROVIDER_SITE_OTHER): Payer: BLUE CROSS/BLUE SHIELD | Admitting: Family Medicine

## 2014-10-20 VITALS — BP 106/62 | HR 84 | Temp 97.9°F | Resp 16 | Wt 167.0 lb

## 2014-10-20 DIAGNOSIS — J069 Acute upper respiratory infection, unspecified: Secondary | ICD-10-CM | POA: Diagnosis not present

## 2014-10-20 NOTE — Patient Instructions (Addendum)
Discussed use of Mucinex D and Delsym. May continue Nyquil at bedtime.

## 2014-10-20 NOTE — Progress Notes (Signed)
Subjective:     Patient ID: [name] [name], female   DOB: 1990-10-31, 24 y.o.   MRN: [id number]  HPI  Chief Complaint  Patient presents with  . Cough    Patient comes in office today with concerns of cough and congestion for the past 4-5days. Patient describes cough as produtive of mucous, she has tried taking otc: Ibuprofen, Night Quil, Benadryl and a expectorant  Reports she smokes 1/2 ppd but does not smoke when she is ill.   Review of Systems  Constitutional: Positive for fever (low grade). Negative for chills.       Objective:   Physical Exam  Constitutional: She appears well-developed and well-nourished. No distress.  Ears: T.M's intact without inflammation Sinuses: non-tender Throat: no tonsillar enlargement or exudate Neck: no cervical adenopathy Lungs: clear     Assessment:    1. Upper respiratory infection    Plan:    Discussed use of Mucinex D and Delysm in addition to Nyquil.

## 2014-11-02 ENCOUNTER — Encounter: Payer: Self-pay | Admitting: Psychiatry

## 2014-11-02 ENCOUNTER — Ambulatory Visit: Payer: BLUE CROSS/BLUE SHIELD | Admitting: Licensed Clinical Social Worker

## 2014-11-02 ENCOUNTER — Ambulatory Visit (INDEPENDENT_AMBULATORY_CARE_PROVIDER_SITE_OTHER): Payer: BLUE CROSS/BLUE SHIELD | Admitting: Psychiatry

## 2014-11-02 VITALS — BP 118/60 | HR 96 | Temp 98.3°F | Ht 62.0 in | Wt 167.2 lb

## 2014-11-02 DIAGNOSIS — F3181 Bipolar II disorder: Secondary | ICD-10-CM | POA: Diagnosis not present

## 2014-11-02 DIAGNOSIS — F900 Attention-deficit hyperactivity disorder, predominantly inattentive type: Secondary | ICD-10-CM | POA: Diagnosis not present

## 2014-11-02 MED ORDER — AMPHETAMINE-DEXTROAMPHET ER 10 MG PO CP24: 10.0000 mg | ORAL_CAPSULE | ORAL | Status: DC

## 2014-11-02 NOTE — Progress Notes (Signed)
BH MD/PA/NP OP Progress Note  11/02/2014 3:29 PM [name] [name]  MRN:  [id number]  Subjective:  Patient returns a follow-up or bipolar 2 and ADHD. Patient indicates that her biggest stressor now his physical conditions. She states she had a tooth pulled last week and she's had upper respiratory tract infection for the past 2 weeks. She states she has been given some pain medications for her tooth extraction but that she stopped taking it after 3 days.  In regards to her mood she states it is better with the Abilify. She indicates that she's not waking up angry. She does state that she continues to feel like she is searching for happiness. She states that one of the things that his grounding her is her child. States she does not do crazy things because she does not want have a negative impact on her child and we agreed this is a good thing. She asked about whether I have a "happy pill." I indicated that we do have antidepressants and that perhaps we could discuss this at the next visit as patient was 15 minutes late for this appointment. She did indicate that she is not profoundly depressed but that she is feeling somewhat stressed and unsatisfied from day-to-day life. Review of therapists notes indicate that there may be relationship issues that have been ongoing.  Visit Diagnosis:  No diagnosis found.  Past Medical History:  Past Medical History  Diagnosis Date  . ADHD (attention deficit hyperactivity disorder)   . Depression   . Anxiety   . Bipolar disorder     Past Surgical History  Procedure Laterality Date  . Tonsillectomy     Family History:  Family History  Problem Relation Age of Onset  . Hypertension Mother   . Diabetes Mother   . Anxiety disorder Mother   . Asthma Mother   . Diabetes Father   . Hypertension Father   . Alcohol abuse Father   . Depression Father   . Arthritis Father   . Arthritis Sister   . Anxiety disorder Brother   . Hypertension Brother   . Arthritis  Sister   . Arthritis Sister   . Asthma Sister    Social History:  Social History   Social History  . Marital Status: Single    Spouse Name: N/A  . Number of Children: N/A  . Years of Education: N/A   Social History Main Topics  . Smoking status: Current Every Day Smoker -- 0.50 packs/day    Types: Cigarettes    Start date: 09/06/2009  . Smokeless tobacco: Never Used  . Alcohol Use: 2.4 oz/week    0 Standard drinks or equivalent, 4 Glasses of wine per week  . Drug Use: No  . Sexual Activity: Yes    Birth Control/ Protection: Implant   Other Topics Concern  . Not on file   Social History Narrative   Additional History:   Assessment:   Musculoskeletal: Strength & Muscle Tone: within normal limits Gait & Station: normal Patient leans: N/A  Psychiatric Specialty Exam: HPI  Review of Systems  Psychiatric/Behavioral: Negative for depression, suicidal ideas, hallucinations, memory loss and substance abuse. The patient is not nervous/anxious and does not have insomnia.     There were no vitals taken for this visit.There is no weight on file to calculate BMI.  General Appearance: Neat and Well Groomed  Eye Contact:  Good  Speech:  Normal Rate  Volume:  Normal  Mood:  "Even"  Affect:  bright,  euthymic  Thought Process:  Linear and Logical  Orientation:  Full (Time, Place, and Person)  Thought Content:  Negative  Suicidal Thoughts:  No  Homicidal Thoughts:  No  Memory:  Immediate;   Good Recent;   Good Remote;   Good  Judgement:  Good  Insight:  Good  Psychomotor Activity:  Negative  Concentration:  Good  Recall:  Good  Fund of Knowledge: Good  Language: Good  Akathisia:  Negative  Handed:  Right  AIMS (if indicated): Done today, normal  Assets:  Communication Skills Desire for Improvement Physical Health Social Support  ADL's:  Intact  Cognition: WNL  Sleep:  good   Is the patient at risk to self?  No. Has the patient been a risk to self in the past 6  months?  No. Has the patient been a risk to self within the distant past?  No. Is the patient a risk to others?  No. Has the patient been a risk to others in the past 6 months?  No. Has the patient been a risk to others within the distant past?  No.  Current Medications: Current Outpatient Prescriptions  Medication Sig Dispense Refill  . amphetamine-dextroamphetamine (ADDERALL XR) 10 MG 24 hr capsule Take 1 capsule (10 mg total) by mouth every morning. To be filled on November 08, 2014 30 capsule 0  . ARIPiprazole (ABILIFY) 15 MG tablet Take 1 tablet (15 mg total) by mouth daily. 30 tablet 2  . meloxicam (MOBIC) 15 MG tablet Take 15 mg by mouth daily.     No current facility-administered medications for this visit.    Medical Decision Making:  Established Problem, Stable/Improving (1) and Review of Medication Regimen & Side Effects (2)  Treatment Plan Summary:Medication management and Plan Patient reports good mood stability on Abilify 15 mg daily.  Adderall XR 10 mg in the morning.   Bipolar disorder type II-continue Abilify 15 mg daily.  ADHD-continue Adderall 10 mg in the morning.  Feels stable on his current medication she will follow-up in 2 months.  Wallace Going 11/02/2014, 3:29 PM

## 2014-11-09 ENCOUNTER — Ambulatory Visit: Payer: Self-pay | Admitting: Family Medicine

## 2014-11-14 ENCOUNTER — Telehealth: Payer: Self-pay

## 2014-11-14 MED ORDER — AMPHETAMINE-DEXTROAMPHET ER 10 MG PO CP24: 10.0000 mg | ORAL_CAPSULE | ORAL | Status: DC

## 2014-11-14 NOTE — Telephone Encounter (Signed)
called pt rx will be at front desk for pick up. rx is for adderall xr id# [id number] order # [id number]

## 2014-11-14 NOTE — Telephone Encounter (Signed)
pt called states that she was seen on the 11-02-14 and that she knows dr. Mayford Knife gave her a rx for adderall and it was on the rx not to refill until 11-08-14 but patient states that she has lost rx and can not find it. is it anyway he can reprint rx.  Pt next appt is 01-04-15.  (i did call the pharmacy and they stated that the last time it was filled was on 10-12-14)

## 2014-11-16 ENCOUNTER — Ambulatory Visit: Payer: Self-pay | Admitting: Family Medicine

## 2014-11-16 ENCOUNTER — Ambulatory Visit: Payer: BLUE CROSS/BLUE SHIELD | Admitting: Licensed Clinical Social Worker

## 2014-11-23 ENCOUNTER — Encounter: Payer: Self-pay | Admitting: Family Medicine

## 2014-11-23 ENCOUNTER — Ambulatory Visit (INDEPENDENT_AMBULATORY_CARE_PROVIDER_SITE_OTHER): Payer: BLUE CROSS/BLUE SHIELD | Admitting: Family Medicine

## 2014-11-23 VITALS — BP 98/58 | HR 80 | Temp 98.4°F | Resp 16 | Ht 62.0 in | Wt 174.0 lb

## 2014-11-23 DIAGNOSIS — F3181 Bipolar II disorder: Secondary | ICD-10-CM

## 2014-11-23 DIAGNOSIS — S92001S Unspecified fracture of right calcaneus, sequela: Secondary | ICD-10-CM
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
## 2014-11-23 NOTE — Patient Instructions (Signed)
Generic Ankle Exercises EXERCISES RANGE OF MOTION (ROM) AND STRETCHING EXERCISES These exercises may help you when beginning to rehabilitate your injury. Your symptoms may resolve with or without further involvement from your physician, physical therapist or athletic trainer. While completing these exercises, remember:   Restoring tissue flexibility helps normal motion to return to the joints. This allows healthier, less painful movement and activity.  An effective stretch should be held for at least 30 seconds.  A stretch should never be painful. You should only feel a gentle lengthening or release in the stretched tissue. RANGE OF MOTION - Dorsi/Plantar Flexion  While sitting with your right / left knee straight, draw the top of your foot upwards by flexing your ankle. Then reverse the motion, pointing your toes downward.  Hold each position for __________ seconds.  After completing your first set of exercises, repeat this exercise with your knee bent. Repeat __________ times. Complete this exercise __________ times per day.  RANGE OF MOTION - Ankle Alphabet  Imagine your right / left big toe is a pen.  Keeping your hip and knee still, write out the entire alphabet with your "pen." Make the letters as large as you can without increasing any discomfort. Repeat __________ times. Complete this exercise __________ times per day.  RANGE OF MOTION - Ankle Dorsiflexion, Active Assisted   Remove shoes and sit on a chair that is preferably not on a carpeted surface.  Place right / left foot under knee. Extend your opposite leg for support.  Keeping your heel down, slide your right / left foot back toward the chair until you feel a stretch at your ankle or calf. If you do not feel a stretch, slide your bottom forward to the edge of the chair while still keeping your heel down.  Hold this stretch for __________ seconds. Repeat __________ times. Complete this stretch __________ times per day.    STRENGTHENING EXERCISES  These exercises may help you when beginning to rehabilitate your injury. They may resolve your symptoms with or without further involvement from your physician, physical therapist or athletic trainer. While completing these exercises, remember:   Muscles can gain both the endurance and the strength needed for everyday activities through controlled exercises.  Complete these exercises as instructed by your physician, physical therapist or athletic trainer. Progress the resistance and repetitions only as guided.  You may experience muscle soreness or fatigue, but the pain or discomfort you are trying to eliminate should never worsen during these exercises. If this pain does worsen, stop and make certain you are following the directions exactly. If the pain is still present after adjustments, discontinue the exercise until you can discuss the trouble with your clinician. STRENGTH - Dorsiflexors  Secure a rubber exercise band/tubing to a fixed object (table, pole) and loop the other end around your right / left foot.  Sit on the floor facing the fixed object. The band/tubing should be slightly tense when your foot is relaxed.  Slowly draw your foot back toward you using your ankle and toes.  Hold this position for __________ seconds. Slowly release the tension in the band and return your foot to the starting position. Repeat __________ times. Complete this exercise __________ times per day.  STRENGTH - Plantar-flexors  Sit with your right / left leg extended. Holding onto both ends of a rubber exercise band/tubing, loop it around the ball of your foot. Keep a slight tension in the band.  Slowly push your toes away from you, pointing   them downward.  Hold this position for __________ seconds. Return slowly, controlling the tension in the band/tubing. Repeat __________ times. Complete this exercise __________ times per day.  STRENGTH - Ankle Eversion  Secure one end of  a rubber exercise band/tubing to a fixed object (table, pole). Loop the other end around your foot just before your toes.  Place your fists between your knees. This will focus your strengthening at your ankle.  Drawing the band/tubing across your opposite foot, slowly, pull your little toe out and up. Make sure the band/tubing is positioned to resist the entire motion.  Hold this position for __________ seconds.  Have your muscles resist the band/tubing as it slowly pulls your foot back to the starting position. Repeat __________ times. Complete this exercise __________ times per day.  STRENGTH - Ankle Inversion  Secure one end of a rubber exercise band/tubing to a fixed object (table, pole). Loop the other end around your foot just before your toes.  Place your fists between your knees. This will focus your strengthening at your ankle.  Slowly, pull your big toe up and in, making sure the band/tubing is positioned to resist the entire motion.  Hold this position for __________ seconds.  Have your muscles resist the band/tubing as it slowly pulls your foot back to the starting position. Repeat __________ times. Complete this exercises __________ times per day.  STRENGTH - Towel Curls  Sit in a chair positioned on a non-carpeted surface.  Place your foot on a towel, keeping your heel on the floor.  Pull the towel toward your heel by only curling your toes. Keep your heel on the floor. If instructed by your physician, physical therapist or athletic trainer, add weight to the end of the towel. Repeat __________ times. Complete this exercise __________ times per day. STRENGTH - Plantar-flexors, Standing  Stand with your feet shoulder width apart. Steady yourself with a wall or table using as little support as needed.  Keeping your weight evenly spread over the width of your feet, rise up on your toes.*  Hold this position for __________ seconds. Repeat __________ times. Complete  this exercise __________ times per day.  *If this is too easy, shift your weight toward your right / left leg until you feel challenged. Ultimately, you may be asked to do this exercise with your right / left foot only. BALANCE - Tandem Walking  Place your uninjured foot on a line 2-4 inches wide and at least 10 feet long.  Keeping your balance without using anything for extra support, place your right / left heel directly in front of your other foot.  Slowly raise your back foot up, lifting from the heel to the toes, and place it directly in front of the right / left foot.  Continue to walk along the line slowly. Walk for ____________________ feet. Repeat ____________________ times. Complete ____________________ times per day.   This information is not intended to replace advice given to you by your health care provider. Make sure you discuss any questions you have with your health care provider.   Document Released: 12/18/2004 Document Revised: 02/24/2014 Document Reviewed: 05/18/2008 Elsevier Interactive Patient Education 2016 [employer]  

## 2014-12-14 ENCOUNTER — Other Ambulatory Visit: Payer: Self-pay

## 2014-12-14 MED ORDER — ARIPIPRAZOLE 15 MG PO TABS: 15.0000 mg | ORAL_TABLET | Freq: Every day | ORAL | Status: DC

## 2014-12-14 MED ORDER — AMPHETAMINE-DEXTROAMPHET ER 10 MG PO CP24: 10.0000 mg | ORAL_CAPSULE | ORAL | Status: DC

## 2014-12-14 NOTE — Telephone Encounter (Signed)
mail back full/ could not leave a message . pt rx is ready for pickup.  adderall xr  rx id # [id number] order # [id number]

## 2014-12-14 NOTE — Telephone Encounter (Signed)
pt called states she needs a refill on her medications . pt last seen on  11-02-14  and next appt 01-04-15.  Pt needs refill on abilify and on adderall

## 2014-12-28 ENCOUNTER — Ambulatory Visit (INDEPENDENT_AMBULATORY_CARE_PROVIDER_SITE_OTHER): Payer: BLUE CROSS/BLUE SHIELD | Admitting: Licensed Clinical Social Worker

## 2014-12-28 DIAGNOSIS — F3181 Bipolar II disorder: Secondary | ICD-10-CM

## 2015-01-02 NOTE — Progress Notes (Signed)
   THERAPIST PROGRESS NOTE  Session Time: 60min  Participation Level: Active  Behavioral Response: CasualAlertEuthymic  Type of Therapy: Individual Therapy  Treatment Goals addressed: Coping  Interventions: CBT, Motivational Interviewing, Solution Focused, Supportive and Reframing  Summary: [name] [name] is a 24 y.o. female who presents with symptoms of her diagnosis.  Discussion of transition of therapist.  Reviewed past history of her symptoms and psychiatric treatment.  Discussion of what she has learned in past therapy session and discussion of what she is currently working on.   Suicidal/Homicidal: Nowithout intent/plan  Therapist Response: Writer recommends Outpatient Therapy at least twice monthly to include but not limited to individual, group and or family therapy.  Medication Management is also recommended to assist with her mood.   Plan: Return again in 2 weeks.  Diagnosis: Axis I: Bipolar 2    Axis II: No diagnosis    Marinda Elkicole M Amberlin Utke 01/02/2015

## 2015-01-04 ENCOUNTER — Ambulatory Visit: Payer: BLUE CROSS/BLUE SHIELD | Admitting: Psychiatry

## 2015-01-08 ENCOUNTER — Encounter: Payer: Self-pay | Admitting: Physician Assistant

## 2015-01-08 ENCOUNTER — Ambulatory Visit (INDEPENDENT_AMBULATORY_CARE_PROVIDER_SITE_OTHER): Payer: BLUE CROSS/BLUE SHIELD | Admitting: Physician Assistant

## 2015-01-08 VITALS — BP 92/62 | HR 79 | Temp 98.9°F | Resp 16 | Wt 182.0 lb

## 2015-01-08 DIAGNOSIS — N3001 Acute cystitis with hematuria: Secondary | ICD-10-CM | POA: Diagnosis not present

## 2015-01-08 LAB — POCT URINALYSIS DIPSTICK
BILIRUBIN UA: NEGATIVE
GLUCOSE UA: NEGATIVE
KETONES UA: NEGATIVE
Nitrite, UA: NEGATIVE
PROTEIN UA: 100
Spec Grav, UA: 1.025
Urobilinogen, UA: 0.2
pH, UA: 6

## 2015-01-08 MED ORDER — CIPROFLOXACIN HCL 500 MG PO TABS: 500.0000 mg | ORAL_TABLET | Freq: Two times a day (BID) | ORAL | Status: DC

## 2015-01-08 NOTE — Patient Instructions (Signed)

## 2015-01-09 LAB — URINE CULTURE

## 2015-01-10 NOTE — Progress Notes (Signed)
   Subjective:    Patient ID: [name] [name], female    DOB: 01-26-1991, 24 y.o.   MRN: [id number]  Dysuria  This is a new problem. The current episode started in the past 7 days (2 days ago). The problem occurs every urination. The problem has been gradually worsening. The quality of the pain is described as burning. The pain is at a severity of 3/10. There has been no fever. She is sexually active. There is a history of pyelonephritis. Associated symptoms include frequency and hematuria. Pertinent negatives include no chills, discharge, flank pain, hesitancy, nausea, possible pregnancy, sweats, urgency or vomiting. She has tried nothing for the symptoms. Her past medical history is significant for recurrent UTIs. There is no history of catheterization, kidney stones, a single kidney, urinary stasis or a urological procedure.      Review of Systems  Constitutional: Negative for fever and chills.  Respiratory: Negative.   Cardiovascular: Negative.   Gastrointestinal: Negative for nausea, vomiting and abdominal pain.  Genitourinary: Positive for dysuria, frequency and hematuria. Negative for hesitancy, urgency and flank pain.       Objective:   Physical Exam  Constitutional: She is oriented to person, place, and time. She appears well-developed and well-nourished. No distress.  Cardiovascular: Normal rate, regular rhythm and normal heart sounds.  Exam reveals no gallop and no friction rub.   No murmur heard. Pulmonary/Chest: Effort normal and breath sounds normal. No respiratory distress. She has no wheezes. She has no rales.  Abdominal: Soft. Normal appearance and bowel sounds are normal. She exhibits no distension and no mass. There is no hepatosplenomegaly. There is tenderness in the suprapubic area. There is no rebound, no guarding and no CVA tenderness.  Suprapubic tenderness  Neurological: She is alert and oriented to person, place, and time.  Skin: Skin is warm and dry. She is not  diaphoretic.       Assessment & Plan:  1. Acute cystitis with hematuria UA was positive for leukocytes and blood.  Will send for urine culture.  Will treat empirically with ciprofloxacin as below.  Will adjust antibiotic therapy as needed pending C&S results.  She is to call the office if symptoms fail to improve or worsen. - Urine Culture - POCT urinalysis dipstick - ciprofloxacin (CIPRO) 500 MG tablet; Take 1 tablet (500 mg total) by mouth 2 (two) times daily.  Dispense: 20 tablet; Refill: 0

## 2015-01-18 ENCOUNTER — Ambulatory Visit: Payer: BLUE CROSS/BLUE SHIELD | Admitting: Licensed Clinical Social Worker

## 2015-01-18 ENCOUNTER — Ambulatory Visit: Payer: BLUE CROSS/BLUE SHIELD | Admitting: Psychiatry

## 2015-01-22 ENCOUNTER — Other Ambulatory Visit: Payer: Self-pay

## 2015-01-22 MED ORDER — AMPHETAMINE-DEXTROAMPHET ER 10 MG PO CP24: 10.0000 mg | ORAL_CAPSULE | ORAL | Status: DC

## 2015-01-22 NOTE — Telephone Encounter (Signed)
spoke with pt, rx ready to be picked up

## 2015-01-22 NOTE — Telephone Encounter (Signed)
pt made appt for 02-01-15, pt needed a thursday appt. pt was told that she would only received enought pill to do until her appt. pt was ok with that.

## 2015-01-24 ENCOUNTER — Telehealth: Payer: Self-pay

## 2015-01-24 NOTE — Telephone Encounter (Signed)
left message that rx was still up front since 01-22-15.  left message that if she needed rx to be mailed to please contact our office.

## 2015-01-29 NOTE — Telephone Encounter (Signed)
pt came and picked up rx.

## 2015-01-31 ENCOUNTER — Other Ambulatory Visit: Payer: Self-pay

## 2015-01-31 MED ORDER — ARIPIPRAZOLE 15 MG PO TABS: 15.0000 mg | ORAL_TABLET | Freq: Every day | ORAL | Status: DC

## 2015-01-31 NOTE — Telephone Encounter (Signed)
received a fax today requesting refill on aripiprazole 15mg  pt has a appt for 02-01-15 but the patient has canceled and no showed several appts

## 2015-02-01 ENCOUNTER — Ambulatory Visit: Payer: Self-pay | Admitting: Psychiatry

## 2015-02-01 ENCOUNTER — Ambulatory Visit: Payer: BLUE CROSS/BLUE SHIELD | Admitting: Psychiatry

## 2015-02-01 ENCOUNTER — Other Ambulatory Visit: Payer: Self-pay | Admitting: Family Medicine

## 2015-02-01 MED ORDER — ARIPIPRAZOLE 15 MG PO TABS: 15.0000 mg | ORAL_TABLET | Freq: Every day | ORAL | Status: DC

## 2015-02-01 NOTE — Telephone Encounter (Signed)
Patient notified

## 2015-02-01 NOTE — Telephone Encounter (Signed)
Patient had to reschedule appointment and will run out of medication before she returns for OV next week.

## 2015-02-08 ENCOUNTER — Ambulatory Visit (INDEPENDENT_AMBULATORY_CARE_PROVIDER_SITE_OTHER): Payer: BLUE CROSS/BLUE SHIELD | Admitting: Psychiatry

## 2015-02-08 ENCOUNTER — Encounter: Payer: Self-pay | Admitting: Psychiatry

## 2015-02-08 VITALS — BP 110/68 | HR 92 | Temp 98.8°F | Ht 62.0 in | Wt 187.8 lb

## 2015-02-08 DIAGNOSIS — F900 Attention-deficit hyperactivity disorder, predominantly inattentive type: Secondary | ICD-10-CM

## 2015-02-08 DIAGNOSIS — F3181 Bipolar II disorder: Secondary | ICD-10-CM

## 2015-02-08 MED ORDER — ARIPIPRAZOLE 15 MG PO TABS: 15.0000 mg | ORAL_TABLET | Freq: Every day | ORAL | Status: DC

## 2015-02-08 MED ORDER — AMPHETAMINE-DEXTROAMPHET ER 10 MG PO CP24: 10.0000 mg | ORAL_CAPSULE | ORAL | Status: DC

## 2015-02-08 NOTE — Progress Notes (Signed)
BH MD/PA/NP OP Progress Note  02/08/2015 2:18 PM [name] GingerCheryl [name]  MRN:  [id number]  Subjective:  Patient returns a follow-up or bipolar 2 and ADHD. She feels like overall she's been doing well on the medications. She states that the Adderall is helped her with her attention and focus. She feels like the Abilify is kept her mood stable. She denies any symptoms of mania or major depressive disorder. States her biggest issue right now is difficulty sleeping which has been occurring over the past year. She states that she can fall asleep with a typically within 2 hours she is up. We reviewed sleep hygiene and she did not seem to endorse any problematic behaviors that would impact sleep (i.e. daytime napping, caffeinated drinks). Chief Complaint    Follow-up; Medication Refill     Visit Diagnosis:     ICD-9-CM ICD-10-CM   1. Bipolar 2 disorder (HCC) 296.89 F31.81   2. 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 ADHD (attention deficit hyperactivity disorder)   . Depression   . Anxiety   . Bipolar disorder [facility](HCC)     Past Surgical History  Procedure Laterality Date  . Tonsillectomy     Family History:  Family History  Problem Relation Age of Onset  . Hypertension Mother   . Diabetes Mother   . Anxiety disorder Mother   . Asthma Mother   . Diabetes Father   . Hypertension Father   . Alcohol abuse Father   . Depression Father   . Arthritis Father   . Arthritis Sister   . Anxiety disorder Brother   . Hypertension Brother   . Arthritis Sister   . Arthritis Sister   . Asthma Sister    Social History:  Social History   Social History  . Marital Status: Single    Spouse Name: N/A  . Number of Children: N/A  . Years of Education: N/A   Social History Main Topics  . Smoking status: Current Every Day Smoker -- 0.50 packs/day    Types: Cigarettes    Start date:  09/06/2009  . Smokeless tobacco: Never Used  . Alcohol Use: 2.4 oz/week    0 Standard drinks or equivalent, 4 Glasses of wine per week  . Drug Use: No  . Sexual Activity: Yes    Birth Control/ Protection: Implant   Other Topics Concern  . None   Social History Narrative   Additional History:   Assessment:   Musculoskeletal: Strength & Muscle Tone: within normal limits Gait & Station: normal Patient leans: N/A  Psychiatric Specialty Exam: HPI  Review of Systems  Psychiatric/Behavioral: Negative for depression, suicidal ideas, hallucinations, memory loss and substance abuse. The patient is not nervous/anxious and does not have insomnia.     Blood pressure 110/68, pulse 92, temperature 98.8 F (37.1 C), temperature source Tympanic, height 5\' 2"  (1.575 m), weight 187 lb 12.8 oz (85.186 kg), SpO2 97 %.Body mass index is 34.34 kg/(m^2).  General Appearance: Neat and Well Groomed  Eye Contact:  Good  Speech:  Normal Rate  Volume:  Normal  Mood:  "Even"  Affect:  bright, euthymic  Thought Process:  Linear and Logical  Orientation:  Full (Time, Place, and Person)  Thought Content:  Negative  Suicidal Thoughts:  No  Homicidal Thoughts:  No  Memory:  Immediate;   Good Recent;   Good Remote;   Good  Judgement:  Good  Insight:  Good  Psychomotor Activity:  Negative  Concentration:  Good  Recall:  Good  Fund of Knowledge: Good  Language: Good  Akathisia:  Negative  Handed:  Right  AIMS (if indicated): Done on 10/12/14  Assets:  Communication Skills Desire for Improvement Physical Health Social Support  ADL's:  Intact  Cognition: WNL  Sleep:  good   Is the patient at risk to self?  No. Has the patient been a risk to self in the past 6 months?  No. Has the patient been a risk to self within the distant past?  No. Is the patient a risk to others?  No. Has the patient been a risk to others in the past 6 months?  No. Has the patient been a risk to others within the  distant past?  No.  Current Medications: Current Outpatient Prescriptions  Medication Sig Dispense Refill  . amphetamine-dextroamphetamine (ADDERALL XR) 10 MG 24 hr capsule Take 1 capsule (10 mg total) by mouth every morning. 30 capsule 0  . ARIPiprazole (ABILIFY) 15 MG tablet Take 1 tablet (15 mg total) by mouth daily. 30 tablet 4  . diclofenac (VOLTAREN) 75 MG EC tablet Take 75 mg by mouth 2 (two) times daily with a meal.  2   No current facility-administered medications for this visit.    Medical Decision Making:  Established Problem, Stable/Improving (1) and Review of Medication Regimen & Side Effects (2)  Treatment Plan Summary:Medication management and Plan Patient reports good mood stability on Abilify 15 mg daily.  Adderall XR 10 mg in the morning.   Bipolar disorder type II-continue Abilify 15 mg daily.  ADHD-continue Adderall 10 mg in the morning.  Patient will follow-up in 6 weeks. I've made her aware of my departure from the clinic in February 2016. I made her aware that she will have the opportunity to follow with another provider within the [facility].  Wallace Going 02/08/2015, 2:18 PM

## 2015-02-14 NOTE — Telephone Encounter (Signed)
Refill- reorder 

## 2015-03-01 ENCOUNTER — Other Ambulatory Visit: Payer: Self-pay

## 2015-03-13 ENCOUNTER — Other Ambulatory Visit: Payer: Self-pay

## 2015-03-13 MED ORDER — AMPHETAMINE-DEXTROAMPHET ER 10 MG PO CP24: 10.0000 mg | ORAL_CAPSULE | ORAL | Status: DC

## 2015-03-13 NOTE — Telephone Encounter (Signed)
pt called left message that she needs a refill on her adderal xr. pt last seen on 02-08-15 next appt  03-22-15

## 2015-03-13 NOTE — Telephone Encounter (Signed)
left message rx ready for pick up  rx for adderall xr ready for pick up ID# [id number] order # [id number]

## 2015-03-22 ENCOUNTER — Encounter: Payer: Self-pay | Admitting: Psychiatry

## 2015-03-22 ENCOUNTER — Ambulatory Visit (INDEPENDENT_AMBULATORY_CARE_PROVIDER_SITE_OTHER): Payer: BLUE CROSS/BLUE SHIELD | Admitting: Psychiatry

## 2015-03-22 VITALS — BP 108/60 | HR 98 | Temp 98.5°F | Ht 62.0 in | Wt 192.8 lb

## 2015-03-22 DIAGNOSIS — F3181 Bipolar II disorder: Secondary | ICD-10-CM | POA: Diagnosis not present

## 2015-03-22 DIAGNOSIS — F900 Attention-deficit hyperactivity disorder, predominantly inattentive type: Secondary | ICD-10-CM

## 2015-03-22 MED ORDER — AMPHETAMINE-DEXTROAMPHET ER 10 MG PO CP24: 10.0000 mg | ORAL_CAPSULE | ORAL | Status: DC

## 2015-03-22 NOTE — Progress Notes (Signed)
BH MD/PA/NP OP Progress Note  03/22/2015 2:16 PM [name] [name]  MRN:  [id number]  Subjective:  Patient returns a follow-up or bipolar 2 and ADHD. She feels like overall she's been doing well on the medications. She states she continues to go to work and she is started on line school. She states she is also raising her 26-year-old. She states that she just feels like she's busy and has a lot on her plate. She denies any issues with mood instability or reckless behavior. However she states that there was one episode where she drank a lot and she states that she was somewhat out of control with her boyfriend but denies that this was continuous thing and she knows she does not need to drink.  She states she has not noticed any side effects from her medication. Chief Complaint    Follow-up; Medication Refill     Visit Diagnosis:   No diagnosis found.  Past Medical History:  Past Medical History  Diagnosis Date  . ADHD (attention deficit hyperactivity disorder)   . Depression   . Anxiety   . Bipolar disorder [facility])     Past Surgical History  Procedure Laterality Date  . Tonsillectomy     Family History:  Family History  Problem Relation Age of Onset  . Hypertension Mother   . Diabetes Mother   . Anxiety disorder Mother   . Asthma Mother   . Diabetes Father   . Hypertension Father   . Alcohol abuse Father   . Depression Father   . Arthritis Father   . Arthritis Sister   . Anxiety disorder Brother   . Hypertension Brother   . Arthritis Sister   . Arthritis Sister   . Asthma Sister    Social History:  Social History   Social History  . Marital Status: Single    Spouse Name: N/A  . Number of Children: N/A  . Years of Education: N/A   Social History Main Topics  . Smoking status: Current Every Day Smoker -- 0.50 packs/day    Types: Cigarettes    Start date: 09/06/2009  . Smokeless tobacco: Never Used  . Alcohol Use: 2.4 oz/week    0 Standard drinks or equivalent, 4  Glasses of wine per week  . Drug Use: No  . Sexual Activity: Yes    Birth Control/ Protection: Implant   Other Topics Concern  . None   Social History Narrative   Additional History:   Assessment:    Musculoskeletal: Strength & Muscle Tone: within normal limits Gait & Station: normal Patient leans: N/A  Psychiatric Specialty Exam: HPI  Review of Systems  Psychiatric/Behavioral: Negative for depression, suicidal ideas, hallucinations, memory loss and substance abuse. The patient is not nervous/anxious and does not have insomnia.   All other systems reviewed and are negative.   Blood pressure 108/60, pulse 98, temperature 98.5 F (36.9 C), temperature source Tympanic, height  (1.575 m), weight 192 lb 12.8 oz (87.454 kg), SpO2 95 %.Body mass index is 35.25 kg/(m^2).  General Appearance: Neat and Well Groomed  Eye Contact:  Good  Speech:  Normal Rate  Volume:  Normal  Mood:  "Even"  Affect:  bright, euthymic  Thought Process:  Linear and Logical  Orientation:  Full (Time, Place, and Person)  Thought Content:  Negative  Suicidal Thoughts:  No  Homicidal Thoughts:  No  Memory:  Immediate;   Good Recent;   Good Remote;   Good  Judgement:  Good  Insight:  Good  Psychomotor Activity:  Negative  Concentration:  Good  Recall:  Good  Fund of Knowledge: Good  Language: Good  Akathisia:  Negative  Handed:  Right  AIMS (if indicated): Done on 10/12/14  Assets:  Communication Skills Desire for Improvement Physical Health Social Support  ADL's:  Intact  Cognition: WNL  Sleep:  good   Is the patient at risk to self?  No. Has the patient been a risk to self in the past 6 months?  No. Has the patient been a risk to self within the distant past?  No. Is the patient a risk to others?  No. Has the patient been a risk to others in the past 6 months?  No. Has the patient been a risk to others within the distant past?  No.  Current Medications: Current Outpatient  Prescriptions  Medication Sig Dispense Refill  . amphetamine-dextroamphetamine (ADDERALL XR) 10 MG 24 hr capsule Take 1 capsule (10 mg total) by mouth every morning. 30 capsule 0  . ARIPiprazole (ABILIFY) 15 MG tablet Take 1 tablet (15 mg total) by mouth daily. 30 tablet 4  . diclofenac (VOLTAREN) 75 MG EC tablet Take 75 mg by mouth 2 (two) times daily with a meal.  2   No current facility-administered medications for this visit.    Medical Decision Making:  Established Problem, Stable/Improving (1) and Review of Medication Regimen & Side Effects (2)  Treatment Plan Summary:Medication management and Plan Patient reports good mood stability on Abilify 15 mg daily.  Adderall XR 10 mg in the morning.   Bipolar disorder type II-continue Abilify 15 mg daily.  ADHD-continue Adderall 10 mg in the morning. I have given her prescription to fill this month as well as a another prescription to fill in March. She is aware she'll need to call for additional prescriptions.  Patient will follow-up in 2 months. Aware of my departure and that she will follow-up with a new psychiatrist within the practice.   Wallace Going 03/22/2015, 2:16 PM

## 2015-05-17 ENCOUNTER — Ambulatory Visit: Payer: BLUE CROSS/BLUE SHIELD | Admitting: Psychiatry

## 2015-05-24 ENCOUNTER — Encounter: Payer: Self-pay | Admitting: Psychiatry

## 2015-05-24 ENCOUNTER — Ambulatory Visit (INDEPENDENT_AMBULATORY_CARE_PROVIDER_SITE_OTHER): Payer: BLUE CROSS/BLUE SHIELD | Admitting: Psychiatry

## 2015-05-24 VITALS — BP 138/78 | HR 86 | Temp 98.4°F | Ht 62.0 in | Wt 196.2 lb

## 2015-05-24 DIAGNOSIS — F3181 Bipolar II disorder: Secondary | ICD-10-CM | POA: Diagnosis not present

## 2015-05-24 DIAGNOSIS — F900 Attention-deficit hyperactivity disorder, predominantly inattentive type: Secondary | ICD-10-CM | POA: Diagnosis not present

## 2015-05-24 MED ORDER — LAMOTRIGINE 25 MG PO TABS: 25.0000 mg | ORAL_TABLET | Freq: Every day | ORAL | Status: DC

## 2015-05-24 MED ORDER — ARIPIPRAZOLE 15 MG PO TABS: 15.0000 mg | ORAL_TABLET | Freq: Every day | ORAL | Status: DC

## 2015-05-24 MED ORDER — AMPHETAMINE-DEXTROAMPHET ER 10 MG PO CP24: 10.0000 mg | ORAL_CAPSULE | ORAL | Status: DC

## 2015-05-24 NOTE — Progress Notes (Signed)
BH MD/PA/NP OP Progress Note  05/24/2015 12:28 PM [name] GingerCheryl Domino  MRN:  [id number]  Subjective:  Patient is a 25 year old female with history of bipolar 2 and ADHD resented for follow-up. She was previously following Dr. Mayford KnifeWilliams. She reported that she has been diagnosed with bipolar by Dr. Mayford KnifeWilliams and she is currently taking Abilify. She reported that she was also prescribed Adderall by her primary care physician. She reported that she is taking online classes and wants the dose of her Adderall to be increased. She spends most of the time with her 26-year-old at home and has been having difficulty with her mood swings depressive symptoms. She reported that she feels depressed at least 3-4 times per day. She feels hopeless helpless and has worsening of her depressive symptoms. She is also having difficulty completing her schoolwork. She appeared anxious during the appointment. She reported that she has also gained a lot of weight on the Abilify. She take both the medication in the morning as it is for her to remember. She currently denied having any suicidal homicidal ideations or plans.     She is receptive to medication changes at this time.   Chief Complaint    Follow-up; Medication Refill     Visit Diagnosis:     ICD-9-CM ICD-10-CM   1. Bipolar 2 disorder (HCC) 296.89 F31.81   2. 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 ADHD (attention deficit hyperactivity disorder)   . Depression   . Anxiety   . Bipolar disorder [facility](HCC)     Past Surgical History  Procedure Laterality Date  . Tonsillectomy     Family History:  Family History  Problem Relation Age of Onset  . Hypertension Mother   . Diabetes Mother   . Anxiety disorder Mother   . Asthma Mother   . Diabetes Father   . Hypertension Father   . Alcohol abuse Father   . Depression Father   . Arthritis Father    . Arthritis Sister   . Anxiety disorder Brother   . Hypertension Brother   . Arthritis Sister   . Arthritis Sister   . Asthma Sister    Social History:  Social History   Social History  . Marital Status: Single    Spouse Name: N/A  . Number of Children: N/A  . Years of Education: N/A   Social History Main Topics  . Smoking status: Current Every Day Smoker -- 0.50 packs/day    Types: Cigarettes    Start date: 09/06/2009  . Smokeless tobacco: Never Used  . Alcohol Use: 2.4 oz/week    0 Standard drinks or equivalent, 4 Glasses of wine per week  . Drug Use: No  . Sexual Activity: Yes    Birth Control/ Protection: Implant   Other Topics Concern  . None   Social History Narrative   Additional History:  Currently lives with her 10053-year-old son. Taking online classes Assessment:    Musculoskeletal: Strength & Muscle Tone: within normal limits Gait & Station: normal Patient leans: N/A  Psychiatric Specialty Exam: HPI  Review of Systems  Psychiatric/Behavioral: Negative for depression, suicidal ideas, hallucinations, memory loss and substance abuse. The patient is not nervous/anxious and does not have insomnia.   All other systems reviewed and are negative.   Blood pressure 138/78, pulse 86, temperature 98.4 F (36.9 C), temperature source Tympanic, height 5\' 2"  (1.575 m), weight 196  lb 3.2 oz (88.996 kg), SpO2 96 %.Body mass index is 35.88 kg/(m^2).  General Appearance: Neat and Well Groomed  Eye Contact:  Good  Speech:  Normal Rate  Volume:  Normal  Mood:  Anxious and Depressed  Affect:  Congruent  Thought Process:  Linear and Logical  Orientation:  Full (Time, Place, and Person)  Thought Content:  Negative  Suicidal Thoughts:  No  Homicidal Thoughts:  No  Memory:  Immediate;   Good Recent;   Good Remote;   Good  Judgement:  Good  Insight:  Good  Psychomotor Activity:  Negative  Concentration:  Good  Recall:  Good  Fund of Knowledge: Good  Language: Good   Akathisia:  Negative  Handed:  Right  AIMS (if indicated): Done on 10/12/14  Assets:  Communication Skills Desire for Improvement Physical Health Social Support  ADL's:  Intact  Cognition: WNL  Sleep:  good   Is the patient at risk to self?  No. Has the patient been a risk to self in the past 6 months?  No. Has the patient been a risk to self within the distant past?  No. Is the patient a risk to others?  No. Has the patient been a risk to others in the past 6 months?  No. Has the patient been a risk to others within the distant past?  No.  Current Medications: Current Outpatient Prescriptions  Medication Sig Dispense Refill  . amphetamine-dextroamphetamine (ADDERALL XR) 10 MG 24 hr capsule Take 1 capsule (10 mg total) by mouth every morning. 30 capsule 0  . ARIPiprazole (ABILIFY) 15 MG tablet Take 1 tablet (15 mg total) by mouth daily. 30 tablet 4  . diclofenac (VOLTAREN) 75 MG EC tablet Take 75 mg by mouth 2 (two) times daily with a meal. Reported on 05/24/2015  2   No current facility-administered medications for this visit.    Medical Decision Making:  Established Problem, Stable/Improving (1) and Review of Medication Regimen & Side Effects (2)  Treatment Plan Summary:Medication management and Plan Patient reports good mood stability on Abilify 15 mg daily. Discussed with her about starting her on lamotrigine 25 mg daily and she demonstrated understanding. She will continue on  Adderall XR 10 mg in the morning. Prescription given for the next month.  Advised her about the side effects of the lamotrigine including the risk of  rash and she demonstrated understanding  Follow-up in 4 weeks    More than 50% of the time spent in psychoeducation, counseling and coordination of care.    This note was generated in part or whole with voice recognition software. Voice regonition is usually quite accurate but there are transcription errors that can and very often do occur. I  apologize for any typographical errors that were not detected and corrected.     More than 50% of the time spent in psychoeducation, counseling and coordination of care.    Brandy Hale, MD   05/24/2015, 12:28 PM

## 2015-06-21 ENCOUNTER — Ambulatory Visit (INDEPENDENT_AMBULATORY_CARE_PROVIDER_SITE_OTHER): Payer: Self-pay | Admitting: Psychiatry

## 2015-06-28 ENCOUNTER — Ambulatory Visit: Payer: BLUE CROSS/BLUE SHIELD | Admitting: Psychiatry

## 2015-07-05 ENCOUNTER — Encounter: Payer: Self-pay | Admitting: Psychiatry

## 2015-07-05 ENCOUNTER — Ambulatory Visit (INDEPENDENT_AMBULATORY_CARE_PROVIDER_SITE_OTHER): Payer: BLUE CROSS/BLUE SHIELD | Admitting: Psychiatry

## 2015-07-05 VITALS — BP 102/72 | HR 95 | Temp 97.9°F | Ht 62.0 in | Wt 200.6 lb

## 2015-07-05 DIAGNOSIS — F3181 Bipolar II disorder: Secondary | ICD-10-CM | POA: Diagnosis not present

## 2015-07-05 DIAGNOSIS — F900 Attention-deficit hyperactivity disorder, predominantly inattentive type: Secondary | ICD-10-CM

## 2015-07-05 MED ORDER — AMPHETAMINE-DEXTROAMPHET ER 10 MG PO CP24: 10.0000 mg | ORAL_CAPSULE | ORAL | Status: DC

## 2015-07-05 MED ORDER — LAMOTRIGINE 25 MG PO TABS: 50.0000 mg | ORAL_TABLET | Freq: Every day | ORAL | Status: DC
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
## 2015-07-27 ENCOUNTER — Other Ambulatory Visit (HOSPITAL_COMMUNITY): Payer: Self-pay | Admitting: Orthopaedic Surgery

## 2015-07-27 DIAGNOSIS — M25571 Pain in right ankle and joints of right foot: Secondary | ICD-10-CM

## 2015-08-02 ENCOUNTER — Encounter: Payer: Self-pay | Admitting: Psychiatry

## 2015-08-02 ENCOUNTER — Ambulatory Visit (INDEPENDENT_AMBULATORY_CARE_PROVIDER_SITE_OTHER): Payer: BLUE CROSS/BLUE SHIELD | Admitting: Psychiatry

## 2015-08-02 VITALS — BP 122/84 | HR 84 | Temp 98.4°F | Ht 62.0 in | Wt 202.6 lb

## 2015-08-02 DIAGNOSIS — F3181 Bipolar II disorder: Secondary | ICD-10-CM

## 2015-08-02 DIAGNOSIS — F900 Attention-deficit hyperactivity disorder, predominantly inattentive type: Secondary | ICD-10-CM

## 2015-08-02 MED ORDER — LAMOTRIGINE 100 MG PO TABS: 100.0000 mg | ORAL_TABLET | Freq: Every day | ORAL | Status: DC

## 2015-08-02 MED ORDER — AMPHETAMINE-DEXTROAMPHET ER 10 MG PO CP24: 10.0000 mg | ORAL_CAPSULE | ORAL | Status: DC

## 2015-08-02 MED ORDER — ESCITALOPRAM OXALATE 10 MG PO TABS: 10.0000 mg | ORAL_TABLET | Freq: Every day | ORAL | Status: DC

## 2015-08-02 MED ORDER — LAMOTRIGINE 25 MG PO TABS: 50.0000 mg | ORAL_TABLET | Freq: Every day | ORAL | Status: DC
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
## 2015-08-09 ENCOUNTER — Ambulatory Visit (HOSPITAL_COMMUNITY)
Admission: RE | Admit: 2015-08-09 | Discharge: 2015-08-09 | Disposition: A | Discharge status: Home / Self Care | Payer: BLUE CROSS/BLUE SHIELD | Source: Ambulatory Visit | Attending: Orthopaedic Surgery | Admitting: Orthopaedic Surgery

## 2015-08-09 DIAGNOSIS — M25571 Pain in right ankle and joints of right foot: Secondary | ICD-10-CM | POA: Diagnosis present

## 2015-08-09 DIAGNOSIS — S93491A Sprain of other ligament of right ankle, initial encounter: Secondary | ICD-10-CM | POA: Insufficient documentation

## 2015-08-09 DIAGNOSIS — X58XXXA Exposure to other specified factors, initial encounter: Secondary | ICD-10-CM | POA: Insufficient documentation

## 2015-08-09 DIAGNOSIS — M25471 Effusion, right ankle: Secondary | ICD-10-CM | POA: Diagnosis not present

## 2015-08-09 DIAGNOSIS — S92191D Other fracture of right talus, subsequent encounter for fracture with routine healing: Secondary | ICD-10-CM | POA: Diagnosis not present

## 2015-09-19 ENCOUNTER — Ambulatory Visit (INDEPENDENT_AMBULATORY_CARE_PROVIDER_SITE_OTHER): Payer: BLUE CROSS/BLUE SHIELD | Admitting: Sports Medicine

## 2015-09-19 ENCOUNTER — Encounter: Payer: Self-pay | Admitting: Sports Medicine

## 2015-09-19 DIAGNOSIS — M19171 Post-traumatic osteoarthritis, right ankle and foot: Secondary | ICD-10-CM

## 2015-09-19 DIAGNOSIS — M12571 Traumatic arthropathy, right ankle and foot: Secondary | ICD-10-CM | POA: Diagnosis not present

## 2015-09-19 MED ORDER — MELOXICAM 15 MG PO TABS: 15.0000 mg | ORAL_TABLET | Freq: Every day | ORAL | 2 refills | Status: DC
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
## 2015-09-20 ENCOUNTER — Ambulatory Visit: Payer: BLUE CROSS/BLUE SHIELD | Admitting: Sports Medicine

## 2015-09-20 NOTE — Assessment & Plan Note (Signed)
This arthritis primarily involves subtalar joint Surgical approach would be fusion I suggested we try to treat conservatively with Orthotic support Ankle compression Oral arthritis meds  Check agin in 4 to 6 weeks for orthotics

## 2015-09-27 ENCOUNTER — Ambulatory Visit (INDEPENDENT_AMBULATORY_CARE_PROVIDER_SITE_OTHER): Payer: BLUE CROSS/BLUE SHIELD | Admitting: Psychiatry

## 2015-09-27 ENCOUNTER — Encounter: Payer: Self-pay | Admitting: Psychiatry

## 2015-09-27 VITALS — BP 125/78 | HR 62 | Temp 99.0°F | Ht 62.0 in | Wt 190.8 lb

## 2015-09-27 DIAGNOSIS — F3181 Bipolar II disorder: Secondary | ICD-10-CM

## 2015-09-27 DIAGNOSIS — F900 Attention-deficit hyperactivity disorder, predominantly inattentive type: Secondary | ICD-10-CM

## 2015-09-27 MED ORDER — AMPHETAMINE-DEXTROAMPHET ER 10 MG PO CP24
10.0000 mg | ORAL_CAPSULE | ORAL | 0 refills | Status: DC
Start: 2015-09-27 — End: 2015-12-27

## 2015-09-27 MED ORDER — AMPHETAMINE-DEXTROAMPHET ER 10 MG PO CP24
10.0000 mg | ORAL_CAPSULE | ORAL | 0 refills | Status: DC
Start: 2015-09-27 — End: 2015-09-27

## 2015-09-27 MED ORDER — ESCITALOPRAM OXALATE 10 MG PO TABS: 10.0000 mg | ORAL_TABLET | Freq: Every day | ORAL | 1 refills | Status: DC

## 2015-09-27 MED ORDER — LAMOTRIGINE 25 MG PO TABS: 25.0000 mg | ORAL_TABLET | Freq: Every day | ORAL | 1 refills | Status: DC
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
## 2015-10-12 ENCOUNTER — Other Ambulatory Visit: Payer: Self-pay | Admitting: Psychiatry

## 2015-11-01 ENCOUNTER — Ambulatory Visit (INDEPENDENT_AMBULATORY_CARE_PROVIDER_SITE_OTHER): Payer: BLUE CROSS/BLUE SHIELD | Admitting: Sports Medicine

## 2015-11-01 ENCOUNTER — Encounter: Payer: Self-pay | Admitting: Sports Medicine

## 2015-11-01 VITALS — BP 120/60 | Ht 62.0 in | Wt 190.0 lb

## 2015-11-01 DIAGNOSIS — R269 Unspecified abnormalities of gait and mobility: Secondary | ICD-10-CM | POA: Diagnosis not present

## 2015-11-01 DIAGNOSIS — M12571 Traumatic arthropathy, right ankle and foot: Secondary | ICD-10-CM

## 2015-11-01 DIAGNOSIS — R2689 Other abnormalities of gait and mobility: Secondary | ICD-10-CM

## 2015-11-01 DIAGNOSIS — M19171 Post-traumatic osteoarthritis, right ankle and foot: Secondary | ICD-10-CM
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
## 2015-11-02 DIAGNOSIS — R2689 Other abnormalities of gait and mobility: Secondary | ICD-10-CM | POA: Insufficient documentation

## 2015-11-07 ENCOUNTER — Ambulatory Visit (INDEPENDENT_AMBULATORY_CARE_PROVIDER_SITE_OTHER): Payer: BLUE CROSS/BLUE SHIELD | Admitting: Physician Assistant

## 2015-11-07 ENCOUNTER — Encounter: Payer: Self-pay | Admitting: Physician Assistant

## 2015-11-07 ENCOUNTER — Ambulatory Visit
Admission: RE | Admit: 2015-11-07 | Discharge: 2015-11-07 | Disposition: A | Discharge status: Home / Self Care | Payer: BLUE CROSS/BLUE SHIELD | Source: Ambulatory Visit | Attending: Physician Assistant | Admitting: Physician Assistant

## 2015-11-07 DIAGNOSIS — N912 Amenorrhea, unspecified: Secondary | ICD-10-CM

## 2015-11-07 DIAGNOSIS — M542 Cervicalgia: Secondary | ICD-10-CM

## 2015-11-07 DIAGNOSIS — R300 Dysuria: Secondary | ICD-10-CM | POA: Diagnosis not present

## 2015-11-07 DIAGNOSIS — M549 Dorsalgia, unspecified: Secondary | ICD-10-CM

## 2015-11-07 DIAGNOSIS — N3000 Acute cystitis without hematuria: Secondary | ICD-10-CM | POA: Diagnosis not present

## 2015-11-07 DIAGNOSIS — N9489 Other specified conditions associated with female genital organs and menstrual cycle: Secondary | ICD-10-CM | POA: Diagnosis not present

## 2015-11-07 LAB — POCT URINALYSIS DIPSTICK
Bilirubin, UA: NEGATIVE
Blood, UA: NEGATIVE
Glucose, UA: NEGATIVE
Ketones, UA: NEGATIVE
Nitrite, UA: NEGATIVE
PH UA: 6
Spec Grav, UA: 1.02
UROBILINOGEN UA: 0.2

## 2015-11-07 LAB — POCT URINE PREGNANCY: PREG TEST UR: NEGATIVE

## 2015-11-07 MED ORDER — MELOXICAM 15 MG PO TABS: 15.0000 mg | ORAL_TABLET | Freq: Every day | ORAL | 2 refills | Status: DC

## 2015-11-07 MED ORDER — CIPROFLOXACIN HCL 500 MG PO TABS: 500.0000 mg | ORAL_TABLET | Freq: Two times a day (BID) | ORAL | 0 refills | Status: DC

## 2015-11-07 NOTE — Patient Instructions (Signed)
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
## 2015-11-08 LAB — URINE CULTURE

## 2015-12-20 ENCOUNTER — Telehealth: Payer: Self-pay

## 2015-12-20 ENCOUNTER — Ambulatory Visit
Admission: RE | Admit: 2015-12-20 | Discharge: 2015-12-20 | Disposition: A | Discharge status: Home / Self Care | Payer: BLUE CROSS/BLUE SHIELD | Source: Ambulatory Visit | Attending: Physician Assistant | Admitting: Physician Assistant

## 2015-12-20 ENCOUNTER — Ambulatory Visit (INDEPENDENT_AMBULATORY_CARE_PROVIDER_SITE_OTHER): Payer: BLUE CROSS/BLUE SHIELD | Admitting: Physician Assistant

## 2015-12-20 ENCOUNTER — Encounter: Payer: Self-pay | Admitting: Physician Assistant

## 2015-12-20 VITALS — BP 100/60 | HR 68 | Temp 98.4°F | Resp 16 | Ht 62.0 in | Wt 180.0 lb

## 2015-12-20 DIAGNOSIS — M79641 Pain in right hand: Secondary | ICD-10-CM | POA: Insufficient documentation

## 2015-12-20 DIAGNOSIS — Z3041 Encounter for surveillance of contraceptive pills: Secondary | ICD-10-CM

## 2015-12-20 DIAGNOSIS — K219 Gastro-esophageal reflux disease without esophagitis: Secondary | ICD-10-CM | POA: Diagnosis not present

## 2015-12-20 DIAGNOSIS — M654 Radial styloid tenosynovitis [de Quervain]: Secondary | ICD-10-CM | POA: Diagnosis not present

## 2015-12-20 MED ORDER — DESOGESTREL-ETHINYL ESTRADIOL 0.15-30 MG-MCG PO TABS: 1.0000 | ORAL_TABLET | Freq: Every day | ORAL | 4 refills | Status: DC

## 2015-12-20 MED ORDER — OMEPRAZOLE 40 MG PO CPDR: 40.0000 mg | DELAYED_RELEASE_CAPSULE | Freq: Every day | ORAL | 3 refills | Status: DC

## 2015-12-20 NOTE — Telephone Encounter (Signed)
-----   Message from Margaretann LovelessJennifer M Burnette, [place]PA-C sent at 12/20/2015  1:58 PM EDT ----- Hand xray normal

## 2015-12-20 NOTE — Telephone Encounter (Signed)
[id number] 

## 2015-12-20 NOTE — Patient Instructions (Addendum)
De Quervain Tenosynovitis °Tendons attach muscles to bones. They also help with joint movements. When tendons become irritated or swollen, it is called tendinitis. °The extensor pollicis brevis (EPB) tendon connects the EPB muscle to a bone that is near the base of the thumb. The EPB muscle helps to straighten and extend the thumb. De Quervain tenosynovitis is a condition in which the EPB tendon lining (sheath) becomes irritated, thickened, and swollen. This condition is sometimes called stenosing tenosynovitis. This condition causes pain on the thumb side of the back of the wrist. °CAUSES °Causes of this condition include: °· Activities that repeatedly cause your thumb and wrist to extend. °· A sudden increase in activity or change in activity that affects your wrist. °RISK FACTORS: °This condition is more likely to develop in: °· Females. °· People who have diabetes. °· Women who have recently given birth. °· People who are over 40 years of age. °· People who do activities that involve repeated hand and wrist motions, such as tennis, racquetball, volleyball, gardening, and taking care of children. °· People who do heavy labor. °· People who have poor wrist strength and flexibility. °· People who do not warm up properly before activities. °SYMPTOMS °Symptoms of this condition include: °· Pain or tenderness over the thumb side of the back of the wrist when your thumb and wrist are not moving. °· Pain that gets worse when you straighten your thumb or extend your thumb or wrist. °· Pain when the injured area is touched. °· Locking or catching of the thumb joint while you bend and straighten your thumb. °· Decreased thumb motion due to pain. °· Swelling over the affected area. °DIAGNOSIS °This condition is diagnosed with a medical history and physical exam. Your health care provider will ask for details about your injury and ask about your symptoms. °TREATMENT °Treatment may include the use of icing and medicines to  reduce pain and swelling. You may also be advised to wear a splint or brace to limit your thumb and wrist motion. In less severe cases, treatment may also include working with a physical therapist to strengthen your wrist and calm the irritation around your EPB tendon sheath. In severe cases, surgery may be needed. °HOME CARE INSTRUCTIONS °If You Have a Splint or Brace: °· Wear it as told by your health care provider. Remove it only as told by your health care provider. °· Loosen the splint or brace if your fingers become numb and tingle, or if they turn cold and blue. °· Keep the splint or brace clean and dry. °Managing Pain, Stiffness, and Swelling  °· If directed, apply ice to the injured area. °¨ Put ice in a plastic bag. °¨ Place a towel between your skin and the bag. °¨ Leave the ice on for 20 minutes, 2-3 times per day. °· Move your fingers often to avoid stiffness and to lessen swelling. °· Raise (elevate) the injured area above the level of your heart while you are sitting or lying down. °General Instructions °· Return to your normal activities as told by your health care provider. Ask your health care provider what activities are safe for you. °· Take over-the-counter and prescription medicines only as told by your health care provider. °· Keep all follow-up visits as told by your health care provider. This is important. °· Do not drive or operate heavy machinery while taking prescription pain medicine. °SEEK MEDICAL CARE IF: °· Your pain, tenderness, or swelling gets worse, even if you have had   treatment.  You have numbness or tingling in your wrist, hand, or fingers on the injured side.   This information is not intended to replace advice given to you by your health care provider. Make sure you discuss any questions you have with your health care provider.   Document Released: 02/03/2005 Document Revised: 10/25/2014 Document Reviewed: 04/11/2014 Elsevier Interactive Patient Education 2016  Elsevier Inc[employer]   Gastroesophageal Reflux Disease, Adult Normally, food travels down the esophagus and stays in the stomach to be digested. However, when a person has gastroesophageal reflux disease (GERD), food and stomach acid move back up into the esophagus. When this happens, the esophagus becomes sore and inflamed. Over time, GERD can create small holes (ulcers) in the lining of the esophagus.  CAUSES This condition is caused by a problem with the muscle between the esophagus and the stomach (lower esophageal sphincter, or LES). Normally, the LES muscle closes after food passes through the esophagus to the stomach. When the LES is weakened or abnormal, it does not close properly, and that allows food and stomach acid to go back up into the esophagus. The LES can be weakened by certain dietary substances, medicines, and medical conditions, including:  Tobacco use.  Pregnancy.  Having a hiatal hernia.  Heavy alcohol use.  Certain foods and beverages, such as coffee, chocolate, onions, and peppermint. RISK FACTORS This condition is more likely to develop in:  People who have an increased body weight.  People who have connective tissue disorders.  People who use NSAID medicines. SYMPTOMS Symptoms of this condition include:  Heartburn.  Difficult or painful swallowing.  The feeling of having a lump in the throat.  Abitter taste in the mouth.  Bad breath.  Having a large amount of saliva.  Having an upset or bloated stomach.  Belching.  Chest pain.  Shortness of breath or wheezing.  Ongoing (chronic) cough or a night-time cough.  Wearing away of tooth enamel.  Weight loss. Different conditions can cause chest pain. Make sure to see your health care provider if you experience chest pain. DIAGNOSIS Your health care provider will take a medical history and perform a physical exam. To determine if you have mild or severe GERD, your health care provider may also  monitor how you respond to treatment. You may also have other tests, including:  An endoscopy toexamine your stomach and esophagus with a small camera.  A test thatmeasures the acidity level in your esophagus.  A test thatmeasures how much pressure is on your esophagus.  A barium swallow or modified barium swallow to show the shape, size, and functioning of your esophagus. TREATMENT The goal of treatment is to help relieve your symptoms and to prevent complications. Treatment for this condition may vary depending on how severe your symptoms are. Your health care provider may recommend:  Changes to your diet.  Medicine.  Surgery. HOME CARE INSTRUCTIONS Diet  Follow a diet as recommended by your health care provider. This may involve avoiding foods and drinks such as:  Coffee and tea (with or without caffeine).  Drinks that containalcohol.  Energy drinks and sports drinks.  Carbonated drinks or sodas.  Chocolate and cocoa.  Peppermint and mint flavorings.  Garlic and onions.  Horseradish.  Spicy and acidic foods, including peppers, chili powder, curry powder, vinegar, hot sauces, and barbecue sauce.  Citrus fruit juices and citrus fruits, such as oranges, lemons, and limes.  Tomato-based foods, such as red sauce, chili, salsa, and pizza with red sauce.  Fried and fatty foods, such as donuts, french fries, potato chips, and high-fat dressings.  High-fat meats, such as hot dogs and fatty cuts of red and white meats, such as rib eye steak, sausage, ham, and bacon.  High-fat dairy items, such as whole milk, butter, and cream cheese.  Eat small, frequent meals instead of large meals.  Avoid drinking large amounts of liquid with your meals.  Avoid eating meals during the 2-3 hours before bedtime.  Avoid lying down right after you eat.  Do not exercise right after you eat. General Instructions  Pay attention to any changes in your symptoms.  Take  over-the-counter and prescription medicines only as told by your health care provider. Do not take aspirin, ibuprofen, or other NSAIDs unless your health care provider told you to do so.  Do not use any tobacco products, including cigarettes, chewing tobacco, and e-cigarettes. If you need help quitting, ask your health care provider.  Wear loose-fitting clothing. Do not wear anything tight around your waist that causes pressure on your abdomen.  Raise (elevate) the head of your bed 6 inches (15cm).  Try to reduce your stress, such as with yoga or meditation. If you need help reducing stress, ask your health care provider.  If you are overweight, reduce your weight to an amount that is healthy for you. Ask your health care provider for guidance about a safe weight loss goal.  Keep all follow-up visits as told by your health care provider. This is important. SEEK MEDICAL CARE IF:  You have new symptoms.  You have unexplained weight loss.  You have difficulty swallowing, or it hurts to swallow.  You have wheezing or a persistent cough.  Your symptoms do not improve with treatment.  You have a hoarse voice. SEEK IMMEDIATE MEDICAL CARE IF:  You have pain in your arms, neck, jaw, teeth, or back.  You feel sweaty, dizzy, or light-headed.  You have chest pain or shortness of breath.  You vomit and your vomit looks like blood or coffee grounds.  You faint.  Your stool is bloody or black.  You cannot swallow, drink, or eat.   This information is not intended to replace advice given to you by your health care provider. Make sure you discuss any questions you have with your health care provider.   Document Released: 11/13/2004 Document Revised: 10/25/2014 Document Reviewed: 05/31/2014 Elsevier Interactive Patient Education [employer]2016 Elsevier Inc.
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
## 2015-12-21 NOTE — Telephone Encounter (Signed)
Patient advised as below.  

## 2015-12-27 ENCOUNTER — Ambulatory Visit (INDEPENDENT_AMBULATORY_CARE_PROVIDER_SITE_OTHER): Payer: BLUE CROSS/BLUE SHIELD | Admitting: Psychiatry

## 2015-12-27 DIAGNOSIS — F3181 Bipolar II disorder: Secondary | ICD-10-CM | POA: Diagnosis not present

## 2015-12-27 DIAGNOSIS — F900 Attention-deficit hyperactivity disorder, predominantly inattentive type: Secondary | ICD-10-CM | POA: Diagnosis not present

## 2015-12-27 MED ORDER — ESCITALOPRAM OXALATE 10 MG PO TABS: 10.0000 mg | ORAL_TABLET | Freq: Every day | ORAL | 1 refills | Status: DC

## 2015-12-27 MED ORDER — LAMOTRIGINE 25 MG PO TABS: ORAL_TABLET | ORAL | 1 refills | Status: DC

## 2015-12-27 MED ORDER — AMPHETAMINE-DEXTROAMPHET ER 10 MG PO CP24: 10.0000 mg | ORAL_CAPSULE | ORAL | 0 refills | Status: DC

## 2015-12-27 NOTE — Progress Notes (Signed)
BH MD/PA/NP OP Progress Note  12/27/2015 1:29 PM [name] GingerCheryl Hentges  MRN:  [id number]  Subjective:  Patient is a 25 year old female with history of bipolar 2 and ADHD presented  for follow-up. She reported that she has started taking birth control pills as she was not doing well on the implant. She has been having bleeding for the past 5-1/2 weeks. Patient reported that she was diagnosed with PMDD by her primary care physician. She continues to have more swings at least 3 weeks in a month. She reported that she has been taking lamotrigine 50 mg and has not noticed improvement as 100 mg was too sedating. She stated that she also takes Adderall. She continues to be very talkative during the interview. Patient reported that she wants to have her medications adjusted at this time. We discussed about titrating the dose of lamotrigine to 75 mg and she appears receptive at this time. We also discussed about adding the Tegretol at next appointment.  Patient reported that she has not been taking any replacement iron  as she has been having bleeding for the past 5 weeks. Patient appeared calm and alert during the interview. She denied having any perceptual disturbances. She denied having any suicidal homicidal ideations or plans.  She is receptive to her medication changes.        Visit Diagnosis:     ICD-9-CM ICD-10-CM   1. Bipolar 2 disorder (HCC) 296.89 F31.81   2. 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 ADHD (attention deficit hyperactivity disorder)   . Anxiety   . Bipolar disorder (HCC)   . Depression     Past Surgical History:  Procedure Laterality Date  . TONSILLECTOMY     Family History:  Family History  Problem Relation Age of Onset  . Hypertension Mother   . Diabetes Mother   . Anxiety disorder Mother   . Asthma Mother   . Diabetes Father   . Hypertension  Father   . Alcohol abuse Father   . Depression Father   . Arthritis Father   . Arthritis Sister   . Anxiety disorder Brother   . Hypertension Brother   . Arthritis Sister   . Arthritis Sister   . Asthma Sister    Social History:  Social History   Social History  . Marital status: Single    Spouse name: N/A  . Number of children: N/A  . Years of education: N/A   Social History Main Topics  . Smoking status: Current Every Day Smoker    Packs/day: 0.50    Types: Cigarettes    Start date: 09/06/2009  . Smokeless tobacco: Never Used  . Alcohol use 2.4 oz/week    4 Glasses of wine per week  . Drug use: No  . Sexual activity: Yes    Birth control/ protection: Implant   Other Topics Concern  . Not on file   Social History Narrative  . No narrative on file   Additional History:  Currently lives with her 26-year-old son. Taking online classes Assessment:    Musculoskeletal: Strength & Muscle Tone: within normal limits Gait & Station: normal Patient leans: N/A  Psychiatric Specialty Exam: Medication Refill     Review of Systems  Musculoskeletal: Positive for joint pain.  Psychiatric/Behavioral: Positive for depression. Negative for hallucinations, memory loss, substance abuse and suicidal ideas. The patient is nervous/anxious and has insomnia.  All other systems reviewed and are negative.   Last menstrual period 11/29/2015.There is no height or weight on file to calculate BMI.  General Appearance: Neat and Well Groomed  Eye Contact:  Good  Speech:  Normal Rate  Volume:  Normal  Mood:  Anxious  Affect:  Congruent  Thought Process:  Linear and Logical  Orientation:  Full (Time, Place, and Person)  Thought Content:  Negative  Suicidal Thoughts:  No  Homicidal Thoughts:  No  Memory:  Immediate;   Good Recent;   Good Remote;   Good  Judgement:  Good  Insight:  Good  Psychomotor Activity:  Negative  Concentration:  Good  Recall:  Good  Fund of Knowledge: Good   Language: Good  Akathisia:  Negative  Handed:  Right  AIMS (if indicated): Done on 10/12/14  Assets:  Communication Skills Desire for Improvement Physical Health Social Support  ADL's:  Intact  Cognition: WNL  Sleep:  good   Is the patient at risk to self?  No. Has the patient been a risk to self in the past 6 months?  No. Has the patient been a risk to self within the distant past?  No. Is the patient a risk to others?  No. Has the patient been a risk to others in the past 6 months?  No. Has the patient been a risk to others within the distant past?  No.  Current Medications: Current Outpatient Prescriptions  Medication Sig Dispense Refill  . amphetamine-dextroamphetamine (ADDERALL XR) 10 MG 24 hr capsule Take 1 capsule (10 mg total) by mouth every morning. To be filled 11/21/15 30 capsule 0  . desogestrel-ethinyl estradiol (ENSKYCE) 0.15-30 MG-MCG tablet Take 1 tablet by mouth daily. 3 Package 4  . escitalopram (LEXAPRO) 10 MG tablet Take 1 tablet (10 mg total) by mouth daily. 90 tablet 1  . lamoTRIgine (LAMICTAL) 25 MG tablet 2 pills in am and 1 pill in pm 90 tablet 1  . meloxicam (MOBIC) 15 MG tablet Take 1 tablet (15 mg total) by mouth daily. 30 tablet 2  . omeprazole (PRILOSEC) 40 MG capsule Take 1 capsule (40 mg total) by mouth daily. 30 capsule 3   No current facility-administered medications for this visit.     Medical Decision Making:  Established Problem, Stable/Improving (1) and Review of Medication Regimen & Side Effects (2)  Treatment Plan Summary:Medication management and Plan  Patient will be continued on lamotrigine 50 mg in the morning and 25 mg at bedtime. Continue  Lexapro 10 mg daily . 90 day supply was given to the patient She was given 1 month supply of Adderall XR 10 mg daily Follow-up in 1 month or earlier depending on her symptoms     More than 50% of the time spent in psychoeducation, counseling and coordination of care.    This note was  generated in part or whole with voice recognition software. Voice regonition is usually quite accurate but there are transcription errors that can and very often do occur. I apologize for any typographical errors that were not detected and corrected.      Brandy HaleUzma Irianna Gilday, MD   12/27/2015, 1:29 PM

## 2016-01-07 ENCOUNTER — Ambulatory Visit: Payer: BLUE CROSS/BLUE SHIELD | Admitting: Sports Medicine

## 2016-01-24 ENCOUNTER — Ambulatory Visit: Payer: BLUE CROSS/BLUE SHIELD | Admitting: Psychiatry

## 2016-01-30 ENCOUNTER — Encounter: Payer: Self-pay | Admitting: Physician Assistant

## 2016-01-30 ENCOUNTER — Encounter: Payer: Self-pay | Admitting: Psychiatry

## 2016-01-30 ENCOUNTER — Ambulatory Visit (INDEPENDENT_AMBULATORY_CARE_PROVIDER_SITE_OTHER): Payer: BLUE CROSS/BLUE SHIELD | Admitting: Psychiatry

## 2016-01-30 ENCOUNTER — Ambulatory Visit (INDEPENDENT_AMBULATORY_CARE_PROVIDER_SITE_OTHER): Payer: BLUE CROSS/BLUE SHIELD | Admitting: Physician Assistant

## 2016-01-30 VITALS — BP 102/64 | HR 62 | Temp 98.4°F | Wt 176.4 lb

## 2016-01-30 VITALS — BP 100/60 | HR 82 | Temp 98.2°F | Resp 16 | Ht 62.0 in | Wt 176.6 lb

## 2016-01-30 DIAGNOSIS — N946 Dysmenorrhea, unspecified: Secondary | ICD-10-CM | POA: Diagnosis not present

## 2016-01-30 DIAGNOSIS — F900 Attention-deficit hyperactivity disorder, predominantly inattentive type: Secondary | ICD-10-CM

## 2016-01-30 DIAGNOSIS — Z0000 Encounter for general adult medical examination without abnormal findings: Secondary | ICD-10-CM

## 2016-01-30 DIAGNOSIS — Z136 Encounter for screening for cardiovascular disorders: Secondary | ICD-10-CM

## 2016-01-30 DIAGNOSIS — Z13220 Encounter for screening for lipoid disorders: Secondary | ICD-10-CM

## 2016-01-30 DIAGNOSIS — F3181 Bipolar II disorder: Secondary | ICD-10-CM

## 2016-01-30 DIAGNOSIS — Z124 Encounter for screening for malignant neoplasm of cervix: Secondary | ICD-10-CM | POA: Diagnosis not present

## 2016-01-30 DIAGNOSIS — Z833 Family history of diabetes mellitus: Secondary | ICD-10-CM | POA: Diagnosis not present

## 2016-01-30 LAB — POCT URINE PREGNANCY: Preg Test, Ur: NEGATIVE

## 2016-01-30 MED ORDER — ESCITALOPRAM OXALATE 10 MG PO TABS: 10.0000 mg | ORAL_TABLET | Freq: Every day | ORAL | 1 refills | Status: DC

## 2016-01-30 MED ORDER — LAMOTRIGINE 25 MG PO TABS: ORAL_TABLET | ORAL | 1 refills | Status: DC

## 2016-01-30 MED ORDER — AMPHETAMINE-DEXTROAMPHETAMINE 10 MG PO TABS: 10.0000 mg | ORAL_TABLET | Freq: Every day | ORAL | 0 refills | Status: DC

## 2016-01-30 NOTE — Patient Instructions (Signed)

## 2016-01-30 NOTE — Progress Notes (Signed)
Patient: [name] [name], Female    DOB: [birth year], 25 y.o.   MRN: [id number] Visit Date: 01/30/2016  Today's Provider: Margaretann Loveless, PA-C   Chief Complaint  Patient presents with  . Annual Exam   Subjective:    Annual physical exam [name] [name] is a 25 y.o. female who presents today for health maintenance and complete physical. She feels fairly well. She reports exercising none-job is physical. She reports she is sleeping fairly well.She reports that she did not get a menstrual cycle for the month of November. Her implant was removed in September and was put on oral contraception. -----------------------------------------------------------------   Review of Systems  Constitutional: Negative.   HENT: Positive for congestion and ear pain.   Eyes: Negative.   Respiratory: Negative.   Cardiovascular: Negative.   Gastrointestinal: Negative.   Endocrine: Negative.   Genitourinary: Negative.   Musculoskeletal: Negative.   Skin: Negative.   Allergic/Immunologic: Negative.   Neurological: Negative.   Hematological: Negative.   Psychiatric/Behavioral: Negative.     Social History      She  reports that she has been smoking Cigarettes.  She started smoking about 6 years ago. She has been smoking about 0.50 packs per day. She has never used smokeless tobacco. She reports that she drinks about 2.4 oz of alcohol per week . She reports that she does not use drugs.       Social History   Social History  . Marital status: Single    Spouse name: N/A  . Number of children: N/A  . Years of education: N/A   Social History Main Topics  . Smoking status: Current Every Day Smoker    Packs/day: 0.50    Types: Cigarettes    Start date: 09/06/2009  . Smokeless tobacco: Never Used  . Alcohol use 2.4 oz/week    4 Glasses of wine per week  . Drug use: No  . Sexual activity: Yes    Birth control/ protection: Implant   Other Topics Concern  . None   Social History  Narrative  . None    Past Medical History:  Diagnosis Date  . ADHD (attention deficit hyperactivity disorder)   . Anxiety   . Bipolar disorder (HCC)   . Depression      Patient Active Problem List   Diagnosis Date Noted  . Antalgic gait 11/02/2015  . Acne 10/12/2014  . ADD (attention deficit hyperactivity disorder, inattentive type) 10/12/2014  . Allergic rhinitis 10/12/2014  . 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 Dysmenorrhea 10/12/2014  . FOM (frequency of micturition) 10/12/2014  . LBP (low back pain) 10/12/2014  . Cannot sleep 10/12/2014  . Bipolar 2 disorder (HCC) 09/08/2014  . Affective disorder (HCC) 09/07/2014    Past Surgical History:  Procedure Laterality Date  . TONSILLECTOMY      Family History        Family Status  Relation Status  . Mother Alive  . Father Alive  . Sister Alive  . Brother Alive  . Sister Alive  . Sister Deceased        Her family history includes Alcohol abuse in her father; Anxiety disorder in her brother and mother; Arthritis in her father, sister, sister, and sister; Asthma in her mother and sister; Depression in her father; Diabetes in her father and mother; Hypertension in her brother, father, and mother.     No Known Allergies   Current Outpatient Prescriptions:  .  amphetamine-dextroamphetamine (ADDERALL XR)  10 MG 24 hr capsule, Take 1 capsule (10 mg total) by mouth every morning. To be filled 11/21/15, Disp: 30 capsule, Rfl: 0 .  desogestrel-ethinyl estradiol (ENSKYCE) 0.15-30 MG-MCG tablet, Take 1 tablet by mouth daily., Disp: 3 Package, Rfl: 4 .  escitalopram (LEXAPRO) 10 MG tablet, Take 1 tablet (10 mg total) by mouth daily., Disp: 90 tablet, Rfl: 1 .  lamoTRIgine (LAMICTAL) 25 MG tablet, 2 pills in am and 1 pill in pm, Disp: 90 tablet, Rfl: 1 .  meloxicam (MOBIC) 15 MG tablet, Take 1 tablet (15 mg total) by mouth daily., Disp: 30 tablet, Rfl: 2 .  omeprazole (PRILOSEC) 40 MG capsule, Take 1 capsule (40 mg  total) by mouth daily., Disp: 30 capsule, Rfl: 3   Patient Care Team: Margaretann LovelessJennifer M Burnette, PA-C as PCP - General (Family Medicine)      Objective:   Vitals: BP 100/60 (BP Location: Right Arm, Patient Position: Sitting, Cuff Size: Normal)   Pulse 82   Temp 98.2 F (36.8 C) (Oral)   Resp 16   Ht 5\' 2"  (1.575 m)   Wt 176 lb 9.6 oz (80.1 kg)   LMP 12/16/2015   BMI 32.30 kg/m    Physical Exam  Constitutional: She is oriented to person, place, and time. She appears well-developed and well-nourished. No distress.  HENT:  Head: Normocephalic and atraumatic.  Right Ear: Hearing, tympanic membrane, external ear and ear canal normal.  Left Ear: Hearing, tympanic membrane, external ear and ear canal normal.  Nose: Nose normal.  Mouth/Throat: Uvula is midline, oropharynx is clear and moist and mucous membranes are normal. Abnormal dentition. Dental caries present. No oropharyngeal exudate.  Eyes: Conjunctivae and EOM are normal. Pupils are equal, round, and reactive to light. Right eye exhibits no discharge. Left eye exhibits no discharge. No scleral icterus.  Neck: Normal range of motion. Neck supple. No JVD present. Carotid bruit is not present. No tracheal deviation present. No thyromegaly present.  Cardiovascular: Normal rate, regular rhythm, normal heart sounds and intact distal pulses.  Exam reveals no gallop and no friction rub.   No murmur heard. Pulmonary/Chest: Effort normal and breath sounds normal. No respiratory distress. She has no wheezes. She has no rales. She exhibits no tenderness. Right breast exhibits no inverted nipple, no mass, no nipple discharge, no skin change and no tenderness. Left breast exhibits no inverted nipple, no mass, no nipple discharge, no skin change and no tenderness. Breasts are symmetrical.  Abdominal: Soft. Bowel sounds are normal. She exhibits no distension and no mass. There is no tenderness. There is no rebound and no guarding. Hernia confirmed negative  in the right inguinal area and confirmed negative in the left inguinal area.  Genitourinary: Rectum normal, vagina normal and uterus normal. No breast swelling, tenderness, discharge or bleeding. Pelvic exam was performed with patient supine. There is no rash, tenderness, lesion or injury on the right labia. There is no rash, tenderness, lesion or injury on the left labia. Cervix exhibits no motion tenderness, no discharge and no friability. Right adnexum displays no mass, no tenderness and no fullness. Left adnexum displays no mass, no tenderness and no fullness. No erythema, tenderness or bleeding in the vagina. No signs of injury around the vagina. No vaginal discharge found.  Musculoskeletal: Normal range of motion. She exhibits no edema or tenderness.  Lymphadenopathy:    She has no cervical adenopathy.       Right: No inguinal adenopathy present.       Left:  No inguinal adenopathy present.  Neurological: She is alert and oriented to person, place, and time. She has normal reflexes. No cranial nerve deficit. Coordination normal.  Skin: Skin is warm and dry. No rash noted. She is not diaphoretic.  Psychiatric: She has a normal mood and affect. Her behavior is normal. Judgment and thought content normal.  Vitals reviewed.    Depression Screen PHQ 2/9 Scores 11/01/2015 11/01/2015 09/19/2015  PHQ - 2 Score 0 0 0  Exception Documentation Other- indicate reason in comment box Other- indicate reason in comment box Other- indicate reason in comment box      Assessment & Plan:     Routine Health Maintenance and Physical Exam  Exercise Activities and Dietary recommendations Goals    None      Immunization History  Administered Date(s) Administered  . Tdap 05/12/2014    Health Maintenance  Topic Date Due  . HIV Screening  11/07/2004  . PAP SMEAR  11/08/2010  . INFLUENZA VACCINE  02/29/2016 (Originally 09/18/2015)  . TETANUS/TDAP  05/11/2024     Discussed health benefits of physical  activity, and encouraged her to engage in regular exercise appropriate for her age and condition.    1. Annual physical exam Normal physical exam today. Will check labs as below and f/u pending lab results. If labs are stable and WNL she will not need to have these rechecked for one year at her next annual physical exam. She is to call the office in the meantime if she has any acute issue, questions or concerns. - CBC with Differential/Platelet - Comprehensive metabolic panel - TSH  2. Cervical cancer screening Pap collected today. Will send as below and f/u pending results. - Pap IG, CT/NG w/ reflex HPV when ASC-U  3. Dysmenorrhea Urine pregnancy was negative today in the office. - POCT urine pregnancy  4. Family history of diabetes mellitus Will check labs as below and f/u pending results. - Hemoglobin A1c  5. Encounter for lipid screening for cardiovascular disease Will check labs as below and f/u pending results. - Lipid panel  --------------------------------------------------------------------    Margaretann LovelessJennifer M Burnette, PA-C  [facility]

## 2016-01-30 NOTE — Progress Notes (Signed)
BH MD/PA/NP OP Progress Note  01/30/2016 1:53 PM [name] GingerCheryl Bodner  MRN:  [id number]  Subjective:  Patient is a 25 year old female with history of bipolar 2 and ADHD presented  for follow-up. She reported that she has started seeing improvement in her symptoms. She reported that the lamotrigine is helping her. She takes Adderall in the morning. She is trying to be consistent at the time in sofa medications. She enjoys her Thanksgiving. Patient reported that she is unable to sleep well at night due to the Adderall dose. We discussed about the medications in detail. Patient is willing to have the Adderall changed to the regular session release. 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 She reported that her more symptoms are improving at this time. Patient appears more calm and alert during the interview and was not as talkative as usual.   She denied having any perceptual disturbances. She denied having any suicidal homicidal ideations or plans.  She is receptive to her medication changes.       Chief Complaint    Follow-up; Medication Refill     Visit Diagnosis:     ICD-9-CM ICD-10-CM   1. Bipolar 2 disorder (HCC) 296.89 F31.81   2. 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 ADHD (attention deficit hyperactivity disorder)   . Anxiety   . Bipolar disorder (HCC)   . Depression     Past Surgical History:  Procedure Laterality Date  . TONSILLECTOMY     Family History:  Family History  Problem Relation Age of Onset  . Hypertension Mother   . Diabetes Mother   . Anxiety disorder Mother   . Asthma Mother   . Diabetes Father   . Hypertension Father   . Alcohol abuse Father   . Depression Father   . Arthritis Father   . Arthritis Sister   . Anxiety disorder Brother   . Hypertension Brother   . Arthritis Sister   . Arthritis Sister   . Asthma  Sister    Social History:  Social History   Social History  . Marital status: Single    Spouse name: N/A  . Number of children: N/A  . Years of education: N/A   Social History Main Topics  . Smoking status: Current Every Day Smoker    Packs/day: 0.50    Types: Cigarettes    Start date: 09/06/2009  . Smokeless tobacco: Never Used  . Alcohol use 2.4 oz/week    4 Glasses of wine per week  . Drug use: No  . Sexual activity: Yes    Birth control/ protection: Pill   Other Topics Concern  . None   Social History Narrative  . None   Additional History:  Currently lives with her 26-year-old son. Taking online classes Assessment:    Musculoskeletal: Strength & Muscle Tone: within normal limits Gait & Station: normal Patient leans: N/A  Psychiatric Specialty Exam: Medication Refill     Review of Systems  Musculoskeletal: Positive for joint pain.  Psychiatric/Behavioral: Positive for depression. Negative for hallucinations, memory loss, substance abuse and suicidal ideas. The patient is nervous/anxious and has insomnia.   All other systems reviewed and are negative.   Blood pressure 102/64, pulse 62, temperature 98.4 F (36.9 C), temperature source Oral, weight 176 lb 6.4 oz (80 kg), last menstrual period 12/16/2015.Body mass index is 32.26 kg/m.  General Appearance:  Neat and Well Groomed  Eye Contact:  Good  Speech:  Normal Rate  Volume:  Normal  Mood:  Anxious  Affect:  Congruent  Thought Process:  Linear and Logical  Orientation:  Full (Time, Place, and Person)  Thought Content:  Negative  Suicidal Thoughts:  No  Homicidal Thoughts:  No  Memory:  Immediate;   Good Recent;   Good Remote;   Good  Judgement:  Good  Insight:  Good  Psychomotor Activity:  Negative  Concentration:  Good  Recall:  Good  Fund of Knowledge: Good  Language: Good  Akathisia:  Negative  Handed:  Right  AIMS (if indicated): Done on 10/12/14  Assets:  Communication Skills Desire for  Improvement Physical Health Social Support  ADL's:  Intact  Cognition: WNL  Sleep:  good   Is the patient at risk to self?  No. Has the patient been a risk to self in the past 6 months?  No. Has the patient been a risk to self within the distant past?  No. Is the patient a risk to others?  No. Has the patient been a risk to others in the past 6 months?  No. Has the patient been a risk to others within the distant past?  No.  Current Medications: Current Outpatient Prescriptions  Medication Sig Dispense Refill  . desogestrel-ethinyl estradiol (ENSKYCE) 0.15-30 MG-MCG tablet Take 1 tablet by mouth daily. 3 Package 4  . escitalopram (LEXAPRO) 10 MG tablet Take 1 tablet (10 mg total) by mouth daily. 90 tablet 1  . lamoTRIgine (LAMICTAL) 25 MG tablet 2 pills in am and 1 pill in pm 90 tablet 1  . meloxicam (MOBIC) 15 MG tablet Take 1 tablet (15 mg total) by mouth daily. 30 tablet 2  . omeprazole (PRILOSEC) 40 MG capsule Take 1 capsule (40 mg total) by mouth daily. 30 capsule 3  . amphetamine-dextroamphetamine (ADDERALL) 10 MG tablet Take 1 tablet (10 mg total) by mouth daily with breakfast. 30 tablet 0   No current facility-administered medications for this visit.     Medical Decision Making:  Established Problem, Stable/Improving (1) and Review of Medication Regimen & Side Effects (2)  Treatment Plan Summary:Medication management and Plan  Patient will be continued on lamotrigine 50 mg in the morning and 25 mg at bedtime. Continue  Lexapro 10 mg daily . 90 day supply was given to the patient She was given 1 month supply of Adderall 10 mg daily Follow-up in 1 month or earlier depending on her symptoms     More than 50% of the time spent in psychoeducation, counseling and coordination of care.    This note was generated in part or whole with voice recognition software. Voice regonition is usually quite accurate but there are transcription errors that can and very often do occur. I  apologize for any typographical errors that were not detected and corrected.      Brandy HaleUzma Gearldean Lomanto, MD   01/30/2016, 1:53 PM

## 2016-01-31 ENCOUNTER — Telehealth: Payer: Self-pay

## 2016-01-31 LAB — CBC WITH DIFFERENTIAL/PLATELET
BASOS: 0 %
Basophils Absolute: 0 10*3/uL (ref 0.0–0.2)
EOS (ABSOLUTE): 0.2 10*3/uL (ref 0.0–0.4)
EOS: 3 %
HEMATOCRIT: 39.3 % (ref 34.0–46.6)
HEMOGLOBIN: 12.5 g/dL (ref 11.1–15.9)
IMMATURE GRANS (ABS): 0 10*3/uL (ref 0.0–0.1)
IMMATURE GRANULOCYTES: 0 %
LYMPHS: 34 %
Lymphocytes Absolute: 3.1 10*3/uL (ref 0.7–3.1)
MCH: 23.3 pg — AB (ref 26.6–33.0)
MCHC: 31.8 g/dL (ref 31.5–35.7)
MCV: 73 fL — AB (ref 79–97)
MONOCYTES: 6 %
Monocytes Absolute: 0.5 10*3/uL (ref 0.1–0.9)
NEUTROS ABS: 5.1 10*3/uL (ref 1.4–7.0)
NEUTROS PCT: 57 %
PLATELETS: 212 10*3/uL (ref 150–379)
RBC: 5.36 x10E6/uL — ABNORMAL HIGH (ref 3.77–5.28)
RDW: 16.5 % — ABNORMAL HIGH (ref 12.3–15.4)
WBC: 9 10*3/uL (ref 3.4–10.8)

## 2016-01-31 LAB — COMPREHENSIVE METABOLIC PANEL
A/G RATIO: 2 (ref 1.2–2.2)
ALBUMIN: 4.2 g/dL (ref 3.5–5.5)
ALT: 11 IU/L (ref 0–32)
AST: 12 IU/L (ref 0–40)
Alkaline Phosphatase: 97 IU/L (ref 39–117)
BUN / CREAT RATIO: 25 — AB (ref 9–23)
BUN: 15 mg/dL (ref 6–20)
CALCIUM: 9.5 mg/dL (ref 8.7–10.2)
CHLORIDE: 103 mmol/L (ref 96–106)
CO2: 25 mmol/L (ref 18–29)
Creatinine, Ser: 0.61 mg/dL (ref 0.57–1.00)
GFR, EST AFRICAN AMERICAN: 145 mL/min/{1.73_m2} (ref >59)
GFR, EST NON AFRICAN AMERICAN: 126 mL/min/{1.73_m2} (ref >59)
GLOBULIN, TOTAL: 2.1 g/dL (ref 1.5–4.5)
Glucose: 88 mg/dL (ref 65–99)
POTASSIUM: 4.7 mmol/L (ref 3.5–5.2)
Sodium: 140 mmol/L (ref 134–144)
TOTAL PROTEIN: 6.3 g/dL (ref 6.0–8.5)

## 2016-01-31 LAB — TSH: TSH: 0.56 u[IU]/mL (ref 0.450–4.500)

## 2016-01-31 LAB — HEMOGLOBIN A1C
Est. average glucose Bld gHb Est-mCnc: 97 mg/dL
Hgb A1c MFr Bld: 5 % (ref 4.8–5.6)

## 2016-01-31 LAB — LIPID PANEL
CHOL/HDL RATIO: 3.9 ratio (ref 0.0–4.4)
CHOLESTEROL TOTAL: 164 mg/dL (ref 100–199)
HDL: 42 mg/dL (ref >39)
LDL CALC: 98 mg/dL (ref 0–99)
TRIGLYCERIDES: 119 mg/dL (ref 0–149)
VLDL CHOLESTEROL CAL: 24 mg/dL (ref 5–40)

## 2016-01-31 NOTE — Telephone Encounter (Signed)
Patient has been advised. KW 

## 2016-01-31 NOTE — Telephone Encounter (Signed)
-----   Message from Margaretann LovelessJennifer M Burnette, PA-C sent at 01/31/2016  8:25 AM EST ----- All labs are within normal limits and stable.  Thanks! -JB

## 2016-02-02 LAB — PAP IG, CT-NG, RFX HPV ASCU
CHLAMYDIA, NUC. ACID AMP: NEGATIVE
Gonococcus by Nucleic Acid Amp: NEGATIVE
PAP Smear Comment: 0

## 2016-02-04 ENCOUNTER — Telehealth: Payer: Self-pay

## 2016-02-04 NOTE — Telephone Encounter (Signed)
LMTCB

## 2016-02-04 NOTE — Telephone Encounter (Signed)
-----   Message from Margaretann LovelessJennifer M Burnette, PA-C sent at 02/04/2016  9:12 AM EST ----- Pap was normal and GC/Chlamydia neg

## 2016-02-04 NOTE — Telephone Encounter (Signed)
Patient advised as below.  

## 2016-02-26 ENCOUNTER — Other Ambulatory Visit: Payer: Self-pay | Admitting: *Deleted

## 2016-02-26 DIAGNOSIS — M549 Dorsalgia, unspecified: Secondary | ICD-10-CM

## 2016-02-26 DIAGNOSIS — M542 Cervicalgia: Secondary | ICD-10-CM

## 2016-02-26 MED ORDER — MELOXICAM 15 MG PO TABS: 15.0000 mg | ORAL_TABLET | Freq: Every day | ORAL | 0 refills | Status: DC

## 2016-02-27 ENCOUNTER — Encounter: Payer: Self-pay | Admitting: Psychiatry

## 2016-02-27 ENCOUNTER — Ambulatory Visit (INDEPENDENT_AMBULATORY_CARE_PROVIDER_SITE_OTHER): Payer: BLUE CROSS/BLUE SHIELD | Admitting: Psychiatry

## 2016-02-27 ENCOUNTER — Ambulatory Visit: Payer: BLUE CROSS/BLUE SHIELD | Admitting: Psychiatry

## 2016-02-27 VITALS — BP 119/74 | HR 57 | Temp 97.9°F | Wt 172.2 lb

## 2016-02-27 DIAGNOSIS — F3181 Bipolar II disorder: Secondary | ICD-10-CM

## 2016-02-27 DIAGNOSIS — F900 Attention-deficit hyperactivity disorder, predominantly inattentive type: Secondary | ICD-10-CM

## 2016-02-27 MED ORDER — AMPHETAMINE-DEXTROAMPHETAMINE 10 MG PO TABS: 10.0000 mg | ORAL_TABLET | Freq: Every day | ORAL | 0 refills | Status: DC

## 2016-02-27 MED ORDER — LAMOTRIGINE 25 MG PO TABS: ORAL_TABLET | ORAL | 1 refills | Status: DC

## 2016-02-27 NOTE — Progress Notes (Signed)
BH MD/PA/NP OP Progress Note  02/27/2016 1:25 PM [name] GingerCheryl [name]  MRN:  [id number]  Subjective:  Patient is a 26 year old female with history of bipolar 2 and ADHD presented  for follow-up. She reported that she spent the holidays with her family. Patient reported that she usually feels depressed around the holidays as her sister passed away 2 years ago. She reported that she wants to enjoy the holidays with her family members but she feels depressed as she still remembers her sister. She reported that she will make more effort to feel better around that time of the year. She reported that her son is 27 years old and he wants to get happy. She reported that she has been compliant with her medications. She has been taking Adderall on a regular basis. She has noticed improvement in her symptoms. She feels that the lamotrigine is helping her and she is getting better with her more symptoms. However she continues to have pressured speech and she does not want to go higher on the dose of the medication. She reported that she wants to feel normal. She denied having any suicidal homicidal ideations or plans.   Patient continues to be talkative as usual.      Chief Complaint    Follow-up; Medication Refill     Visit Diagnosis:     ICD-9-CM ICD-10-CM   1. Bipolar 2 disorder (HCC) 296.89 F31.81   2. 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 ADHD (attention deficit hyperactivity disorder)   . Anxiety   . Bipolar disorder (HCC)   . Depression     Past Surgical History:  Procedure Laterality Date  . TONSILLECTOMY     Family History:  Family History  Problem Relation Age of Onset  . Hypertension Mother   . Diabetes Mother   . Anxiety disorder Mother   . Asthma Mother   . Diabetes Father   . Hypertension Father   . Alcohol abuse Father   . Depression Father   . Arthritis  Father   . Arthritis Sister   . Anxiety disorder Brother   . Hypertension Brother   . Arthritis Sister   . Arthritis Sister   . Asthma Sister    Social History:  Social History   Social History  . Marital status: Single    Spouse name: N/A  . Number of children: N/A  . Years of education: N/A   Social History Main Topics  . Smoking status: Current Every Day Smoker    Packs/day: 0.50    Types: Cigarettes    Start date: 09/06/2009  . Smokeless tobacco: Never Used  . Alcohol use 2.4 oz/week    4 Glasses of wine per week  . Drug use: No  . Sexual activity: Yes    Birth control/ protection: Pill   Other Topics Concern  . None   Social History Narrative  . None   Additional History:  Currently lives with her 27-year-old son. Taking online classes Assessment:    Musculoskeletal: Strength & Muscle Tone: within normal limits Gait & Station: normal Patient leans: N/A  Psychiatric Specialty Exam: Medication Refill     Review of Systems  Musculoskeletal: Positive for joint pain.  Psychiatric/Behavioral: Positive for depression. Negative for hallucinations, memory loss, substance abuse and suicidal ideas. The patient is nervous/anxious and has insomnia.   All other systems reviewed and are negative.  Blood pressure 119/74, pulse (!) 57, temperature 97.9 F (36.6 C), temperature source Oral, weight 172 lb 3.2 oz (78.1 kg), last menstrual period 12/16/2015.Body mass index is 31.5 kg/m.  General Appearance: Neat and Well Groomed  Eye Contact:  Good  Speech:  Normal Rate  Volume:  Normal  Mood:  Anxious  Affect:  Congruent  Thought Process:  Linear and Logical  Orientation:  Full (Time, Place, and Person)  Thought Content:  Negative  Suicidal Thoughts:  No  Homicidal Thoughts:  No  Memory:  Immediate;   Good Recent;   Good Remote;   Good  Judgement:  Good  Insight:  Good  Psychomotor Activity:  Negative  Concentration:  Good  Recall:  Good  Fund of Knowledge:  Good  Language: Good  Akathisia:  Negative  Handed:  Right  AIMS (if indicated): Done on 10/12/14  Assets:  Communication Skills Desire for Improvement Physical Health Social Support  ADL's:  Intact  Cognition: WNL  Sleep:  good   Is the patient at risk to self?  No. Has the patient been a risk to self in the past 6 months?  No. Has the patient been a risk to self within the distant past?  No. Is the patient a risk to others?  No. Has the patient been a risk to others in the past 6 months?  No. Has the patient been a risk to others within the distant past?  No.  Current Medications: Current Outpatient Prescriptions  Medication Sig Dispense Refill  . amphetamine-dextroamphetamine (ADDERALL) 10 MG tablet Take 1 tablet (10 mg total) by mouth daily with breakfast. 30 tablet 0  . amphetamine-dextroamphetamine (ADDERALL) 10 MG tablet Take 1 tablet (10 mg total) by mouth daily with breakfast. To be filled 03/30/16 30 tablet 0  . desogestrel-ethinyl estradiol (ENSKYCE) 0.15-30 MG-MCG tablet Take 1 tablet by mouth daily. 3 Package 4  . escitalopram (LEXAPRO) 10 MG tablet Take 1 tablet (10 mg total) by mouth daily. 90 tablet 1  . lamoTRIgine (LAMICTAL) 25 MG tablet 2 pills in am and 1 pill in pm 90 tablet 1  . meloxicam (MOBIC) 15 MG tablet Take 1 tablet (15 mg total) by mouth daily. 30 tablet 0  . omeprazole (PRILOSEC) 40 MG capsule Take 1 capsule (40 mg total) by mouth daily. 30 capsule 3   No current facility-administered medications for this visit.     Medical Decision Making:  Established Problem, Stable/Improving (1) and Review of Medication Regimen & Side Effects (2)  Treatment Plan Summary:Medication management and Plan  Patient will be continued on lamotrigine 50 mg in the morning and 25 mg at bedtime. Continue  Lexapro 10 mg daily . 90 day supply was given to the patient She was given  2 month supply of Adderall 10 mg daily Follow-up in  2 month or earlier depending on her  symptoms    More than 50% of the time spent in psychoeducation, counseling and coordination of care.    This note was generated in part or whole with voice recognition software. Voice regonition is usually quite accurate but there are transcription errors that can and very often do occur. I apologize for any typographical errors that were not detected and corrected.      Brandy Hale, MD   02/27/2016, 1:25 PM

## 2016-02-28 ENCOUNTER — Encounter: Payer: Self-pay | Admitting: Sports Medicine

## 2016-02-28 ENCOUNTER — Ambulatory Visit (INDEPENDENT_AMBULATORY_CARE_PROVIDER_SITE_OTHER): Payer: BLUE CROSS/BLUE SHIELD | Admitting: Sports Medicine

## 2016-02-28 VITALS — BP 110/70 | Ht 62.0 in | Wt 172.0 lb

## 2016-02-28 DIAGNOSIS — M654 Radial styloid tenosynovitis [de Quervain]: Secondary | ICD-10-CM | POA: Diagnosis not present

## 2016-02-28 MED ORDER — METHYLPREDNISOLONE ACETATE 40 MG/ML IJ SUSP
40.0000 mg | Freq: Once | INTRAMUSCULAR | Status: AC
  Administered 2016-02-28: 40 mg via INTRA_ARTICULAR

## 2016-02-28 NOTE — Assessment & Plan Note (Signed)
Injected right wrist and gave thumb spica splint to help with pain during work and at night.  Follow up 1 month to reassess and see if can wean out of splint at work.
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
## 2016-04-02 ENCOUNTER — Ambulatory Visit: Payer: BLUE CROSS/BLUE SHIELD | Admitting: Psychiatry

## 2016-04-03 ENCOUNTER — Ambulatory Visit: Payer: BLUE CROSS/BLUE SHIELD | Admitting: Sports Medicine

## 2016-04-09 ENCOUNTER — Other Ambulatory Visit: Payer: Self-pay | Admitting: Sports Medicine

## 2016-04-09 DIAGNOSIS — M542 Cervicalgia: Secondary | ICD-10-CM

## 2016-04-09 DIAGNOSIS — M549 Dorsalgia, unspecified: Secondary | ICD-10-CM

## 2016-04-23 ENCOUNTER — Ambulatory Visit: Payer: BLUE CROSS/BLUE SHIELD | Admitting: Psychiatry

## 2016-07-02 ENCOUNTER — Ambulatory Visit (INDEPENDENT_AMBULATORY_CARE_PROVIDER_SITE_OTHER): Payer: BLUE CROSS/BLUE SHIELD | Admitting: Psychiatry

## 2016-07-02 ENCOUNTER — Encounter: Payer: Self-pay | Admitting: Psychiatry

## 2016-07-02 VITALS — BP 102/67 | HR 65 | Temp 98.5°F | Wt 178.8 lb

## 2016-07-02 DIAGNOSIS — F3181 Bipolar II disorder: Secondary | ICD-10-CM

## 2016-07-02 MED ORDER — ESCITALOPRAM OXALATE 10 MG PO TABS: 10.0000 mg | ORAL_TABLET | Freq: Every day | ORAL | 1 refills | Status: DC

## 2016-07-02 MED ORDER — LAMOTRIGINE 25 MG PO TABS: 50.0000 mg | ORAL_TABLET | Freq: Two times a day (BID) | ORAL | 1 refills | Status: DC

## 2016-07-02 MED ORDER — CARIPRAZINE HCL 1.5 MG PO CAPS: 1.5000 mg | ORAL_CAPSULE | ORAL | 0 refills | Status: DC
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
## 2016-07-16 ENCOUNTER — Ambulatory Visit (INDEPENDENT_AMBULATORY_CARE_PROVIDER_SITE_OTHER): Payer: BLUE CROSS/BLUE SHIELD | Admitting: Psychiatry

## 2016-07-16 ENCOUNTER — Encounter: Payer: Self-pay | Admitting: Psychiatry

## 2016-07-16 VITALS — BP 115/79 | HR 92 | Temp 97.9°F | Wt 178.8 lb

## 2016-07-16 DIAGNOSIS — F3181 Bipolar II disorder: Secondary | ICD-10-CM

## 2016-07-16 MED ORDER — LAMOTRIGINE 25 MG PO TABS: 50.0000 mg | ORAL_TABLET | Freq: Two times a day (BID) | ORAL | 1 refills | Status: DC

## 2016-07-16 MED ORDER — ESCITALOPRAM OXALATE 10 MG PO TABS: 5.0000 mg | ORAL_TABLET | Freq: Every day | ORAL | 1 refills | Status: DC

## 2016-07-16 MED ORDER — CARIPRAZINE HCL 1.5 MG PO CAPS: 1.5000 mg | ORAL_CAPSULE | ORAL | 1 refills | Status: DC

## 2016-07-16 NOTE — Progress Notes (Signed)
BH MD/PA/NP OP Progress Note  07/16/2016 9:46 AM [name] [name]  MRN:  [id number]  Subjective:  Patient is a 26 year old female with history of bipolar 2  presented  for follow-up. She reported that she has not his improvement in her symptoms since she was started on Vraylar. She feels that the medication has been helping her and her mood is stabilizing. She is taking only 1.5 mg in the morning. She reported that she has switched to Lexapro at bedtime but she has some acid reflux  and she wants to decrease the dose and stop the medications. Patient reported that she has noticed improvement in her depressive symptoms. She currently denied having any suicidal homicidal ideations or plans. She appeared calm and alert. She reported that the Adderall was making her more hyper and agitated and she feels that it was not helping her. She appeared more stable and her mood is becoming more is stable. She is also taking lamotrigine 50 mg twice a day. We discussed about her medications in detail. She denied having any side effects at this time. She denied having any suicidal homicidal ideations or plans. She sleeps well at night. She is willing to discontinue the Lexapro at this time.     Patient currently denied using any drugs or alcohol. She denied having any perceptual disturbances.   Chief Complaint    Follow-up; Medication Refill     Visit Diagnosis:     ICD-9-CM ICD-10-CM   1. Bipolar 2 disorder (HCC) 296.89 F31.81     Past Medical History:  Past Medical History:  Diagnosis Date  . ADHD (attention deficit hyperactivity disorder)   . Anxiety   . Bipolar disorder (HCC)   . Depression     Past Surgical History:  Procedure Laterality Date  . TONSILLECTOMY     Family History:  Family History  Problem Relation Age of Onset  . Hypertension Mother   . Diabetes Mother   . Anxiety disorder Mother   . Asthma Mother   . Diabetes Father   . Hypertension Father   . Alcohol abuse Father   .  Depression Father   . Arthritis Father   . Arthritis Sister   . Anxiety disorder Brother   . Hypertension Brother   . Arthritis Sister   . Arthritis Sister   . Asthma Sister    Social History:  Social History   Social History  . Marital status: Single    Spouse name: N/A  . Number of children: N/A  . Years of education: N/A   Social History Main Topics  . Smoking status: Current Every Day Smoker    Packs/day: 0.50    Types: Cigarettes    Start date: 09/06/2009  . Smokeless tobacco: Never Used  . Alcohol use 2.4 oz/week    4 Glasses of wine per week  . Drug use: No  . Sexual activity: Yes    Birth control/ protection: Pill   Other Topics Concern  . None   Social History Narrative  . None   Additional History:  Currently lives with her 27-year-old son. Taking online classes Assessment:    Musculoskeletal: Strength & Muscle Tone: within normal limits Gait & Station: normal Patient leans: N/A  Psychiatric Specialty Exam: Medication Refill        Family History  Problem Relation Age of Onset  . Hypertension Mother   . Diabetes Mother   . Anxiety disorder Mother   . Asthma Mother   . Diabetes Father   .  Hypertension Father   . Alcohol abuse Father   . Depression Father   . Arthritis Father   . Arthritis Sister   . Anxiety disorder Brother   . Hypertension Brother   . Arthritis Sister   . Arthritis Sister   . Asthma Sister    Social History:  Social History   Social History  . Marital status: Single    Spouse name: N/A  . Number of children: N/A  . Years of education: N/A   Social History Main Topics  . Smoking status: Current Every Day Smoker    Packs/day: 0.50    Types: Cigarettes    Start date: 09/06/2009  . Smokeless tobacco: Never Used  . Alcohol use 2.4 oz/week    4 Glasses of wine per week  . Drug use: No  . Sexual activity: Yes    Birth control/ protection: Pill   Other Topics Concern  . None   Social History Narrative  .  None        Psychiatric Specialty Exam:     Review of Systems  Musculoskeletal: Positive for joint pain.  Psychiatric/Behavioral: Positive for depression. Negative for hallucinations, memory loss, substance abuse and suicidal ideas. The patient is nervous/anxious and has insomnia.   All other systems reviewed and are negative.   Blood pressure 115/79, pulse 92, temperature 97.9 F (36.6 C), temperature source Oral, weight 178 lb 12.8 oz (81.1 kg), last menstrual period 07/14/2016.Body mass index is 32.7 kg/m.  General Appearance: Neat and Well Groomed  Eye Contact:  Good  Speech:  Normal Rate  Volume:  Normal  Mood:  Anxious  Affect:  Congruent  Thought Process:  Linear and Logical  Orientation:  Full (Time, Place, and Person)  Thought Content:  Negative  Suicidal Thoughts:  No  Homicidal Thoughts:  No  Memory:  Immediate;   Good Recent;   Good Remote;   Good  Judgement:  Good  Insight:  Good  Psychomotor Activity:  Negative  Concentration:  Good  Recall:  Good  Fund of Knowledge: Good  Language: Good  Akathisia:  Negative  Handed:  Right  AIMS (if indicated): Done on 10/12/14  Assets:  Communication Skills Desire for Improvement Physical Health Social Support  ADL's:  Intact  Cognition: WNL  Sleep:  good    Current Medications: Current Outpatient Prescriptions  Medication Sig Dispense Refill  . Cariprazine HCl (VRAYLAR) 1.5 MG CAPS Take 1 capsule (1.5 mg total) by mouth as directed. 30 capsule 1  . desogestrel-ethinyl estradiol (ENSKYCE) 0.15-30 MG-MCG tablet Take 1 tablet by mouth daily. 3 Package 4  . escitalopram (LEXAPRO) 10 MG tablet Take 0.5 tablets (5 mg total) by mouth daily. Pt has supply- stop in 1 week 90 tablet 1  . lamoTRIgine (LAMICTAL) 25 MG tablet Take 2 tablets (50 mg total) by mouth 2 (two) times daily. 120 tablet 1  . meloxicam (MOBIC) 15 MG tablet TAKE ONE TABLET BY MOUTH ONCE DAILY 90 tablet 1   No current facility-administered  medications for this visit.              Review of Systems  Musculoskeletal: Positive for joint pain.  Psychiatric/Behavioral: Positive for depression. Negative for hallucinations, memory loss, substance abuse and suicidal ideas. The patient is nervous/anxious and has insomnia.   All other systems reviewed and are negative.   Blood pressure 115/79, pulse 92, temperature 97.9 F (36.6 C), temperature source Oral, weight 178 lb 12.8 oz (81.1 kg), last menstrual period 07/14/2016.Body  mass index is 32.7 kg/m.  General Appearance: Neat and Well Groomed  Eye Contact:  Good  Speech:  Normal Rate  Volume:  Normal  Mood:  Anxious  Affect:  Congruent  Thought Process:  Linear and Logical  Orientation:  Full (Time, Place, and Person)  Thought Content:  Negative  Suicidal Thoughts:  No  Homicidal Thoughts:  No  Memory:  Immediate;   Good Recent;   Good Remote;   Good  Judgement:  Good  Insight:  Good  Psychomotor Activity:  Negative  Concentration:  Good  Recall:  Good  Fund of Knowledge: Good  Language: Good  Akathisia:  Negative  Handed:  Right  AIMS (if indicated): Done on 10/12/14  Assets:  Communication Skills Desire for Improvement Physical Health Social Support  ADL's:  Intact  Cognition: WNL  Sleep:  good   Is the patient at risk to self?  No. Has the patient been a risk to self in the past 6 months?  No. Has the patient been a risk to self within the distant past?  No. Is the patient a risk to others?  No. Has the patient been a risk to others in the past 6 months?  No. Has the patient been a risk to others within the distant past?  No.  Current Medications: Current Outpatient Prescriptions  Medication Sig Dispense Refill  . Cariprazine HCl (VRAYLAR) 1.5 MG CAPS Take 1 capsule (1.5 mg total) by mouth as directed. 30 capsule 1  . desogestrel-ethinyl estradiol (ENSKYCE) 0.15-30 MG-MCG tablet Take 1 tablet by mouth daily. 3 Package 4  . escitalopram (LEXAPRO)  10 MG tablet Take 0.5 tablets (5 mg total) by mouth daily. Pt has supply- stop in 1 week 90 tablet 1  . lamoTRIgine (LAMICTAL) 25 MG tablet Take 2 tablets (50 mg total) by mouth 2 (two) times daily. 120 tablet 1  . meloxicam (MOBIC) 15 MG tablet TAKE ONE TABLET BY MOUTH ONCE DAILY 90 tablet 1   No current facility-administered medications for this visit.       Treatment Plan Summary:Medication management and Plan  Patient will be continued on lamotrigine 50 mg Twice a day.  Change Lexapro 5 mg daily. She will  stop the medication in one week. Continue  Vraylar 1.5 mg qam  and patient was given prescription with a coupon.  Follow-up in 4  weeks or earlier depending on her symptoms   More than 50% of the time spent in psychoeducation, counseling and coordination of care.    This note was generated in part or whole with voice recognition software. Voice regonition is usually quite accurate but there are transcription errors that can and very often do occur. I apologize for any typographical errors that were not detected and corrected.       Brandy Hale, MD   07/16/2016, 9:46 AM

## 2016-07-28 ENCOUNTER — Telehealth: Payer: Self-pay

## 2016-07-28 NOTE — Telephone Encounter (Signed)
a prior authroization was done online and approved fro vraylar.

## 2016-07-28 NOTE — Telephone Encounter (Signed)
prior auth was faxed and confirmed to pharmacy.

## 2016-07-28 NOTE — Telephone Encounter (Signed)
received notice that a prior auth is needed for vraylar

## 2016-08-13 ENCOUNTER — Ambulatory Visit: Payer: BLUE CROSS/BLUE SHIELD | Admitting: Psychiatry

## 2016-09-15 ENCOUNTER — Ambulatory Visit (INDEPENDENT_AMBULATORY_CARE_PROVIDER_SITE_OTHER): Payer: BLUE CROSS/BLUE SHIELD | Admitting: Psychiatry

## 2016-09-15 ENCOUNTER — Encounter: Payer: Self-pay | Admitting: Psychiatry

## 2016-09-15 VITALS — BP 124/74 | HR 89 | Temp 98.7°F | Wt 189.0 lb

## 2016-09-15 DIAGNOSIS — F3181 Bipolar II disorder: Secondary | ICD-10-CM | POA: Diagnosis not present

## 2016-09-15 MED ORDER — ESCITALOPRAM OXALATE 5 MG PO TABS: 5.0000 mg | ORAL_TABLET | Freq: Every day | ORAL | 1 refills | Status: DC

## 2016-09-15 MED ORDER — LAMOTRIGINE 100 MG PO TABS: 100.0000 mg | ORAL_TABLET | Freq: Every day | ORAL | 1 refills | Status: DC

## 2016-09-15 NOTE — Progress Notes (Signed)
BH MD/PA/NP OP Progress Note  09/15/2016 9:38 AM [name] [name]  MRN:  [id number]  Subjective:  Patient is a 26 year old female with history of bipolar 2  presented  for follow-up. She reported that she has  not been feeling  well on since she was started on Vraylar. Shewants to stop the medication as she reported that she is currently experiencing side effects including nausea vomiting and she is not eating well. She reported that it is causing her agitation and she is having mood changes. Patient reported that she wants to continue her medications including Lexapro and lamotrigine. She reported that she has lost her temper couple of times at her work. She does not want to lose her job. She continues to have some reflux as well. We discussed about her medications in detail. Patient reported that she and her boyfriend has noticed that her mood is getting worse.       Chief Complaint    Follow-up; Medication Refill     Visit Diagnosis:     ICD-10-CM   1. Bipolar 2 disorder (HCC) F31.81     Past Medical History:  Past Medical History:  Diagnosis Date  . ADHD (attention deficit hyperactivity disorder)   . Anxiety   . Bipolar disorder (HCC)   . Depression     Past Surgical History:  Procedure Laterality Date  . TONSILLECTOMY     Family History:  Family History  Problem Relation Age of Onset  . Hypertension Mother   . Diabetes Mother   . Anxiety disorder Mother   . Asthma Mother   . Diabetes Father   . Hypertension Father   . Alcohol abuse Father   . Depression Father   . Arthritis Father   . Arthritis Sister   . Anxiety disorder Brother   . Hypertension Brother   . Arthritis Sister   . Arthritis Sister   . Asthma Sister    Social History:  Social History   Social History  . Marital status: Single    Spouse name: N/A  . Number of children: N/A  . Years of education: N/A   Social History Main Topics  . Smoking status: Current Every Day Smoker    Packs/day: 0.50     Types: Cigarettes    Start date: 09/06/2009  . Smokeless tobacco: Never Used  . Alcohol use 2.4 oz/week    4 Glasses of wine per week  . Drug use: No  . Sexual activity: Yes    Birth control/ protection: Pill   Other Topics Concern  . None   Social History Narrative  . None   Additional History:  Currently lives with her 27-year-old son. Taking online classes Assessment:    Musculoskeletal: Strength & Muscle Tone: within normal limits Gait & Station: normal Patient leans: N/A  Psychiatric Specialty Exam: Medication Refill        Family History  Problem Relation Age of Onset  . Hypertension Mother   . Diabetes Mother   . Anxiety disorder Mother   . Asthma Mother   . Diabetes Father   . Hypertension Father   . Alcohol abuse Father   . Depression Father   . Arthritis Father   . Arthritis Sister   . Anxiety disorder Brother   . Hypertension Brother   . Arthritis Sister   . Arthritis Sister   . Asthma Sister    Social History:  Social History   Social History  . Marital status: Single    Spouse  name: N/A  . Number of children: N/A  . Years of education: N/A   Social History Main Topics  . Smoking status: Current Every Day Smoker    Packs/day: 0.50    Types: Cigarettes    Start date: 09/06/2009  . Smokeless tobacco: Never Used  . Alcohol use 2.4 oz/week    4 Glasses of wine per week  . Drug use: No  . Sexual activity: Yes    Birth control/ protection: Pill   Other Topics Concern  . None   Social History Narrative  . None        Psychiatric Specialty Exam:     Review of Systems  Musculoskeletal: Positive for joint pain.  Psychiatric/Behavioral: Positive for depression. Negative for hallucinations, memory loss, substance abuse and suicidal ideas. The patient is nervous/anxious and has insomnia.   All other systems reviewed and are negative.   Blood pressure 124/74, pulse 89, temperature 98.7 F (37.1 C), temperature source Oral,  weight 189 lb (85.7 kg), last menstrual period 09/15/2016.Body mass index is 34.57 kg/m.  General Appearance: Neat and Well Groomed  Eye Contact:  Good  Speech:  Normal Rate  Volume:  Normal  Mood:  Anxious  Affect:  Congruent  Thought Process:  Linear and Logical  Orientation:  Full (Time, Place, and Person)  Thought Content:  Negative  Suicidal Thoughts:  No  Homicidal Thoughts:  No  Memory:  Immediate;   Good Recent;   Good Remote;   Good  Judgement:  Good  Insight:  Good  Psychomotor Activity:  Negative  Concentration:  Good  Recall:  Good  Fund of Knowledge: Good  Language: Good  Akathisia:  Negative  Handed:  Right  AIMS (if indicated): Done on 10/12/14  Assets:  Communication Skills Desire for Improvement Physical Health Social Support  ADL's:  Intact  Cognition: WNL  Sleep:  good    Current Medications: Current Outpatient Prescriptions  Medication Sig Dispense Refill  . lamoTRIgine (LAMICTAL) 100 MG tablet Take 1 tablet (100 mg total) by mouth daily. 30 tablet 1  . meloxicam (MOBIC) 15 MG tablet TAKE ONE TABLET BY MOUTH ONCE DAILY 90 tablet 1  . desogestrel-ethinyl estradiol (ENSKYCE) 0.15-30 MG-MCG tablet Take 1 tablet by mouth daily. (Patient not taking: Reported on 09/15/2016) 3 Package 4  . escitalopram (LEXAPRO) 5 MG tablet Take 1 tablet (5 mg total) by mouth daily. 30 tablet 1   No current facility-administered medications for this visit.              Review of Systems  Musculoskeletal: Positive for joint pain.  Psychiatric/Behavioral: Positive for depression. Negative for hallucinations, memory loss, substance abuse and suicidal ideas. The patient is nervous/anxious and has insomnia.   All other systems reviewed and are negative.   Blood pressure 124/74, pulse 89, temperature 98.7 F (37.1 C), temperature source Oral, weight 189 lb (85.7 kg), last menstrual period 09/15/2016.Body mass index is 34.57 kg/m.  General Appearance: Neat and Well  Groomed  Eye Contact:  Good  Speech:  Normal Rate  Volume:  Normal  Mood:  Anxious  Affect:  Congruent  Thought Process:  Linear and Logical  Orientation:  Full (Time, Place, and Person)  Thought Content:  Negative  Suicidal Thoughts:  No  Homicidal Thoughts:  No  Memory:  Immediate;   Good Recent;   Good Remote;   Good  Judgement:  Good  Insight:  Good  Psychomotor Activity:  Negative  Concentration:  Good  Recall:  Good  Fund of Knowledge: Good  Language: Good  Akathisia:  Negative  Handed:  Right  AIMS (if indicated): Done on 10/12/14  Assets:  Communication Skills Desire for Improvement Physical Health Social Support  ADL's:  Intact  Cognition: WNL  Sleep:  good   Is the patient at risk to self?  No. Has the patient been a risk to self in the past 6 months?  No. Has the patient been a risk to self within the distant past?  No. Is the patient a risk to others?  No. Has the patient been a risk to others in the past 6 months?  No. Has the patient been a risk to others within the distant past?  No.  Current Medications: Current Outpatient Prescriptions  Medication Sig Dispense Refill  . lamoTRIgine (LAMICTAL) 100 MG tablet Take 1 tablet (100 mg total) by mouth daily. 30 tablet 1  . meloxicam (MOBIC) 15 MG tablet TAKE ONE TABLET BY MOUTH ONCE DAILY 90 tablet 1  . desogestrel-ethinyl estradiol (ENSKYCE) 0.15-30 MG-MCG tablet Take 1 tablet by mouth daily. (Patient not taking: Reported on 09/15/2016) 3 Package 4  . escitalopram (LEXAPRO) 5 MG tablet Take 1 tablet (5 mg total) by mouth daily. 30 tablet 1   No current facility-administered medications for this visit.       Treatment Plan Summary:Medication management and Plan  Patient will be continued on lamotrigine 100 mg daily.  Change Lexapro 5 mg daily. She was given 1 month supply of the medication. D/c Vraylar  Follow-up in 4  weeks or earlier depending on her symptoms   More than 50% of the time spent in  psychoeducation, counseling and coordination of care.    This note was generated in part or whole with voice recognition software. Voice regonition is usually quite accurate but there are transcription errors that can and very often do occur. I apologize for any typographical errors that were not detected and corrected.       Brandy HaleUzma Shevaun Lovan, MD   09/15/2016, 9:38 AM

## 2016-10-01 ENCOUNTER — Ambulatory Visit: Payer: Self-pay | Admitting: Family Medicine

## 2016-10-01 ENCOUNTER — Ambulatory Visit: Payer: Self-pay | Admitting: Physician Assistant

## 2016-10-13 ENCOUNTER — Ambulatory Visit: Payer: BLUE CROSS/BLUE SHIELD | Admitting: Psychiatry

## 2016-11-21 ENCOUNTER — Ambulatory Visit (INDEPENDENT_AMBULATORY_CARE_PROVIDER_SITE_OTHER): Payer: BLUE CROSS/BLUE SHIELD | Admitting: Maternal Newborn

## 2016-11-21 ENCOUNTER — Encounter: Payer: Self-pay | Admitting: Maternal Newborn

## 2016-11-21 VITALS — BP 110/66 | HR 74 | Ht 62.0 in | Wt 185.0 lb

## 2016-11-21 DIAGNOSIS — Z124 Encounter for screening for malignant neoplasm of cervix: Secondary | ICD-10-CM | POA: Diagnosis not present

## 2016-11-21 DIAGNOSIS — N926 Irregular menstruation, unspecified: Secondary | ICD-10-CM | POA: Diagnosis not present

## 2016-11-21 DIAGNOSIS — Z3201 Encounter for pregnancy test, result positive: Secondary | ICD-10-CM

## 2016-11-21 DIAGNOSIS — Z716 Tobacco abuse counseling: Secondary | ICD-10-CM | POA: Diagnosis not present

## 2016-11-21 DIAGNOSIS — Z01419 Encounter for gynecological examination (general) (routine) without abnormal findings: Secondary | ICD-10-CM | POA: Diagnosis not present

## 2016-11-21 DIAGNOSIS — F17209 Nicotine dependence, unspecified, with unspecified nicotine-induced disorders: Secondary | ICD-10-CM | POA: Diagnosis not present

## 2016-11-21 NOTE — Progress Notes (Signed)
Gynecology Annual Exam  PCP: Margaretann Loveless, PA-C  Chief Complaint:  Chief Complaint  Patient presents with  . Gynecologic Exam    abnormal uterine bleeding    History of Present Illness: Patient is a 26 y.o. G1P1001 presents for annual exam. The patient has had irregular bleeding, lasting for the past four weeks, ranging from heavy with clots to light brown discharge. She has taken some OCP that were previously prescribed for the past two weeks in an attempt to regulate her cycle.  LMP: Patient's last menstrual period was 10/13/2016 (approximate). Previous to this abnormal bleeding, patient had regular cycles for 6 months. Heavy Menses: no Clots: no Intermenstrual Bleeding: yes Postcoital Bleeding: yes Dysmenorrhea: yes  The patient is sexually active. She currently uses none for contraception. She denies dyspareunia.  The patient occasionally  performs self breast exams.  There is notable family history of breast or ovarian cancer in her family. Declines genetic testing due to cost concerns.  The patient wears seatbelts: yes.   The patient has regular exercise: yes.    The patient denies current symptoms of depression.    Review of Systems  Constitutional: Negative for chills, fever and malaise/fatigue.  HENT: Negative.   Eyes: Negative.   Respiratory: Negative for cough, shortness of breath and wheezing.   Cardiovascular: Negative for chest pain and palpitations.  Gastrointestinal: Positive for diarrhea.  Genitourinary: Negative for dysuria, flank pain, frequency and urgency.       Irregular uterine bleeding  Musculoskeletal: Negative.   Skin: Negative.   Neurological: Negative.   Endo/Heme/Allergies: Negative.   Psychiatric/Behavioral: The patient is nervous/anxious and has insomnia.   Breasts: endorses bilateral breast tenderness. All other systems reviewed and are negative.   Past Medical History:  Past Medical History:  Diagnosis Date  . ADHD  (attention deficit hyperactivity disorder)   . Anxiety   . Bipolar disorder (HCC)   . Depression   . PMDD (premenstrual dysphoric disorder)     Past Surgical History:  Past Surgical History:  Procedure Laterality Date  . TONSILLECTOMY      Gynecologic History:  Patient's last menstrual period was 10/13/2016 (approximate). Contraception: none Last Pap: 01/2016 Results were: NIL Obstetric History: G1P1001  Family History:  Family History  Problem Relation Age of Onset  . Hypertension Mother   . Diabetes Mother   . Anxiety disorder Mother   . Asthma Mother   . Uterine cancer Mother 27       uterine or cervical  . Diabetes Father   . Hypertension Father   . Alcohol abuse Father   . Depression Father   . Arthritis Father   . Arthritis Sister   . Endometriosis Sister   . Anxiety disorder Brother   . Hypertension Brother   . Arthritis Sister   . Arthritis Sister   . Asthma Sister     Social History:  Social History   Social History  . Marital status: Single    Spouse name: N/A  . Number of children: N/A  . Years of education: N/A   Occupational History  . Not on file.   Social History Main Topics  . Smoking status: Current Every Day Smoker    Packs/day: 0.50    Types: Cigarettes    Start date: 09/06/2009  . Smokeless tobacco: Never Used  . Alcohol use 2.4 oz/week    4 Glasses of wine per week  . Drug use: No  . Sexual activity: Yes  Birth control/ protection: Pill   Other Topics Concern  . Not on file   Social History Narrative  . No narrative on file    Allergies:  No Known Allergies  Medications: Prior to Admission medications   Medication Sig Start Date End Date Taking? Authorizing Provider  desogestrel-ethinyl estradiol (ENSKYCE) 0.15-30 MG-MCG tablet Take 1 tablet by mouth daily. 12/20/15  Yes Margaretann Loveless, PA-C  escitalopram (LEXAPRO) 5 MG tablet Take 1 tablet (5 mg total) by mouth daily. 09/15/16  Yes Brandy Hale, MD    lamoTRIgine (LAMICTAL) 100 MG tablet Take 1 tablet (100 mg total) by mouth daily. 09/15/16  Yes Brandy Hale, MD  meloxicam (MOBIC) 15 MG tablet TAKE ONE TABLET BY MOUTH ONCE DAILY 04/09/16  Yes Enid Baas, MD    Physical Exam Vitals: Blood pressure 110/66, pulse 74, height  (1.575 m), weight 185 lb (83.9 kg), last menstrual period 10/13/2016.  General: NAD HEENT: normocephalic, anicteric Thyroid: no enlargement, no palpable nodules Pulmonary: No increased work of breathing, CTAB Cardiovascular: RRR, distal pulses 2+ Breast: Breasts symmetrical, no tenderness, no palpable nodules or masses, no skin or nipple retraction present, no nipple discharge.  No axillary or supraclavicular lymphadenopathy. Abdomen: NABS, soft, non-tender, non-distended.  Umbilicus without lesions.  No hepatomegaly, splenomegaly or masses palpable. No evidence of hernia  Genitourinary:  External: Normal external female genitalia.  Normal urethral meatus, normal  Bartholin's and Skene's glands.    Vagina: Normal vaginal mucosa, no evidence of prolapse.    Cervix: Grossly normal in appearance, no bleeding  Uterus: Non-enlarged, mobile, normal contour.  No CMT  Adnexa: ovaries non-enlarged, no adnexal masses  Rectal: deferred  Lymphatic: no evidence of inguinal lymphadenopathy Extremities: no edema, erythema, or tenderness Neurologic: Grossly intact Psychiatric: mood appropriate, affect full  Assessment: 26 y.o. G1P1001 routine annual exam, with irregular bleeding.  Plan: Problem List Items Addressed This Visit    None    Visit Diagnoses    Encounter for annual routine gynecological examination    -  Primary   Irregular menses       Relevant Orders   Beta HCG, Quant   Papanicolaou smear for cervical cancer screening       Relevant Orders   IGP,CtNgTv,rfx Aptima HPV ASCU   Pregnancy test positive          1) STI screening was offered and accepted.  2) ASCCP guidelines and rationale discussed.   Patient opts for yearly screening interval.  3) Contraception - Patient has been trying to conceive, but has used OCPs the past 2 weeks on own accord to try to regulate her bleeding. Urine pregnancy test was positive today. Beta hCG ordered to determine if continued bleeding is related to threatened miscarriage. Counseled patient to stop smoking.  4) Routine healthcare maintenance including cholesterol, diabetes screening discussed managed by PCP.  5) Follow up based on beta hCG results.  Marcelyn Bruins, CNM 11/21/2016  11:07 AM

## 2016-11-22 LAB — BETA HCG QUANT (REF LAB): HCG QUANT: 539 m[IU]/mL

## 2016-11-24 ENCOUNTER — Telehealth: Payer: Self-pay | Admitting: Maternal Newborn

## 2016-11-24 DIAGNOSIS — O200 Threatened abortion: Secondary | ICD-10-CM

## 2016-11-24 LAB — IGP,CTNGTV,RFX APTIMA HPV ASCU
Chlamydia, Nuc. Acid Amp: NEGATIVE
Gonococcus, Nuc. Acid Amp: NEGATIVE
PAP SMEAR COMMENT: 0
Trich vag by NAA: NEGATIVE

## 2016-11-24 NOTE — Telephone Encounter (Signed)
Spoke with Ms. Forney by phone and informed her of beta hcG result and need for follow-up to determine whether she has a viable pregnancy. She continues to have intermittent bleeding, no pain. She can return to the clinic on Wednesday for a lab visit and I will call her with the results and to determine further management.  Marcelyn Bruins, CNM 11/24/2016  2:17 PM

## 2016-11-26 ENCOUNTER — Other Ambulatory Visit: Payer: BLUE CROSS/BLUE SHIELD

## 2016-11-26 DIAGNOSIS — O200 Threatened abortion: Secondary | ICD-10-CM

## 2016-11-27 LAB — BETA HCG QUANT (REF LAB): HCG QUANT: 196 m[IU]/mL

## 2016-11-28 ENCOUNTER — Ambulatory Visit (INDEPENDENT_AMBULATORY_CARE_PROVIDER_SITE_OTHER): Payer: BLUE CROSS/BLUE SHIELD

## 2016-11-28 ENCOUNTER — Encounter: Payer: Self-pay | Admitting: Maternal Newborn

## 2016-11-28 ENCOUNTER — Other Ambulatory Visit: Payer: Self-pay | Admitting: Maternal Newborn

## 2016-11-28 ENCOUNTER — Ambulatory Visit (INDEPENDENT_AMBULATORY_CARE_PROVIDER_SITE_OTHER): Payer: BLUE CROSS/BLUE SHIELD | Admitting: Maternal Newborn

## 2016-11-28 DIAGNOSIS — O034 Incomplete spontaneous abortion without complication: Secondary | ICD-10-CM | POA: Diagnosis not present

## 2016-11-28 DIAGNOSIS — O200 Threatened abortion: Secondary | ICD-10-CM

## 2016-11-28 DIAGNOSIS — O021 Missed abortion: Secondary | ICD-10-CM

## 2016-11-28 NOTE — Progress Notes (Signed)
Obstetrics & Gynecology Office Visit   Chief Complaint:  Chief Complaint  Patient presents with  . Miscarriage    History of Present Illness: Patient has been bleeding for the past 5+ weeks (she stated four weeks at last visit on 10/5 and eight weeks today) and has cramping pains in her uterus which are intermittent and of medium intensity. Her beta hCG was on 11/21/2016 was 539 and 196 on 11/26/16. Beta hCG drawn at this visit. Ultrasound this visit shows endometrial stripe measurement of 3.42 mm and no gestational sac. Patient is anxious about her continuing symptoms and expresses desire for bleeding to stop.   Review of Systems: 10 point review of systems negative unless otherwise noted in HPI  Past Medical History:  Past Medical History:  Diagnosis Date  . ADHD (attention deficit hyperactivity disorder)   . Anxiety   . Bipolar disorder (HCC)   . Depression   . PMDD (premenstrual dysphoric disorder)     Past Surgical History:  Past Surgical History:  Procedure Laterality Date  . TONSILLECTOMY      Gynecologic History: No LMP recorded.  Obstetric History: G1P1001  Family History:  Family History  Problem Relation Age of Onset  . Hypertension Mother   . Diabetes Mother   . Anxiety disorder Mother   . Asthma Mother   . Uterine cancer Mother 87       uterine or cervical  . Diabetes Father   . Hypertension Father   . Alcohol abuse Father   . Depression Father   . Arthritis Father   . Arthritis Sister   . Endometriosis Sister   . Anxiety disorder Brother   . Hypertension Brother   . Arthritis Sister   . Arthritis Sister   . Asthma Sister     Social History:  Social History   Social History  . Marital status: Single    Spouse name: N/A  . Number of children: N/A  . Years of education: N/A   Occupational History  . Not on file.   Social History Main Topics  . Smoking status: Current Every Day Smoker    Packs/day: 0.50    Types: Cigarettes   Start date: 09/06/2009  . Smokeless tobacco: Never Used  . Alcohol use 2.4 oz/week    4 Glasses of wine per week  . Drug use: No  . Sexual activity: Yes    Birth control/ protection: Pill   Other Topics Concern  . Not on file   Social History Narrative  . No narrative on file    Allergies:  No Known Allergies  Medications: Prior to Admission medications   Medication Sig Start Date End Date Taking? Authorizing Provider  desogestrel-ethinyl estradiol (ENSKYCE) 0.15-30 MG-MCG tablet Take 1 tablet by mouth daily. 12/20/15   Margaretann Loveless, PA-C  escitalopram (LEXAPRO) 5 MG tablet Take 1 tablet (5 mg total) by mouth daily. 09/15/16   Brandy Hale, MD  lamoTRIgine (LAMICTAL) 100 MG tablet Take 1 tablet (100 mg total) by mouth daily. 09/15/16   Brandy Hale, MD  meloxicam (MOBIC) 15 MG tablet TAKE ONE TABLET BY MOUTH ONCE DAILY 04/09/16   Enid Baas, MD    Physical Exam Vitals: BP: 100/70; Weight: 187 lbs. No LMP recorded.  General: NAD HEENT: normocephalic, anicteric Pulmonary: No increased work of breathing Neurologic: Grossly intact Psychiatric: mood appropriate, affect full  Assessment: 26 y.o. G1P1001 with continuing vaginal bleeding and uterine cramping.  Plan: Problem List Items Addressed This Visit  Visit Diagnoses    Threatened miscarriage in early pregnancy    -  Primary   Relevant Orders   Beta HCG, Quant     Condolences were offered to the patient and her partner.  We briefly discussed management options including expectant management, medical management, and surgical management. Patient strongly desires surgical management. I ordered another hCG today. I also spoke with MD about scheduling a surgical procedure; he advised continuing expectant management at least until we have another hCG value to confirm that downward trend is continuing. Patient has no indications of ectopic pregnancy.  Marcelyn Bruins, CNM 11/28/2016  12:29 PM

## 2016-11-30 LAB — BETA HCG QUANT (REF LAB): hCG Quant: 125 m[IU]/mL

## 2016-12-01 ENCOUNTER — Telehealth: Payer: Self-pay | Admitting: Physician Assistant

## 2016-12-01 ENCOUNTER — Other Ambulatory Visit: Payer: Self-pay | Admitting: Maternal Newborn

## 2016-12-01 DIAGNOSIS — O039 Complete or unspecified spontaneous abortion without complication: Secondary | ICD-10-CM | POA: Insufficient documentation

## 2016-12-01 NOTE — Progress Notes (Signed)
Discussed with patient that surgical management is not indicated at this time per discussion with Dr. Jean Rosenthal. She will come in for another beta HCG later this week as her schedule permits and I will follow up with her once the results are back.  Marcelyn Bruins, CNM 12/01/2016  8:13 AM

## 2016-12-01 NOTE — Telephone Encounter (Signed)
She needs a refill on her Meloxicam please.  To [facility] in [place], [place].

## 2016-12-03 ENCOUNTER — Other Ambulatory Visit: Payer: Self-pay

## 2016-12-03 DIAGNOSIS — M549 Dorsalgia, unspecified: Secondary | ICD-10-CM

## 2016-12-03 DIAGNOSIS — M542 Cervicalgia: Secondary | ICD-10-CM

## 2016-12-03 MED ORDER — MELOXICAM 15 MG PO TABS
15.0000 mg | ORAL_TABLET | Freq: Every day | ORAL | 0 refills | Status: DC
Start: 2016-12-03 — End: 2017-08-19

## 2017-01-02 ENCOUNTER — Other Ambulatory Visit: Payer: Self-pay | Admitting: Psychiatry

## 2017-01-11 ENCOUNTER — Other Ambulatory Visit: Payer: Self-pay | Admitting: Psychiatry

## 2017-01-14 ENCOUNTER — Encounter: Payer: Self-pay | Admitting: Family Medicine

## 2017-01-14 ENCOUNTER — Ambulatory Visit: Payer: BLUE CROSS/BLUE SHIELD | Admitting: Family Medicine

## 2017-01-14 VITALS — BP 114/86 | Ht 62.0 in | Wt 175.0 lb

## 2017-01-14 DIAGNOSIS — M654 Radial styloid tenosynovitis [de Quervain]: Secondary | ICD-10-CM | POA: Diagnosis not present

## 2017-01-14 MED ORDER — METHYLPREDNISOLONE ACETATE 40 MG/ML IJ SUSP
40.0000 mg | Freq: Once | INTRAMUSCULAR | Status: AC
  Administered 2017-01-14: 40 mg via INTRA_ARTICULAR

## 2017-01-14 NOTE — Progress Notes (Signed)
Chief complaint: Chronic right wrist pain, acute on chronic 1 month  History of present illness: [name] MaxwellCheryl 26 year old female who presents to sports medicine office today with chief complaint of right wrist pain. She was last seen here in the office about 10.5 months ago back on 02/28/16. She does have known diagnosis of take remains tenosynovitis of the right wrist. She reports having similar symptoms again today. When she was last here back in January she did have cortisone injection into the dorsal right first compartment of the wrist. She reports that she did have complete resolution of symptoms up until 1 month ago. She has not been able to use the thumb spica splint. She does work at [employer] and often has to to do repetitive movements of her hand and wrist. She reports that she does not like to wear the splint at work because of the grease everything that would get on the splint. She does report of pain today on the radial aspect of her right wrist on dorsal aspect. She reports any type of movement of her thumb and ulnar deviation of her wrist causes pain. She has not used any medications for pain. She is not report of any numbness, tingling, or burning paresthesias. She does not report of any elbow pain. She is not report of any weakness in grip strength.  Review of systems:  As stated above  Interval past medical history, surgical history, family history, and social history obtained and unchanged.  Physical exam: Vital signs are reviewed and are documented in the chart Gen.: Alert, oriented, appears stated age, in no apparent distress HEENT: Moist oral mucosa Respiratory: Normal respirations, able to speak in full sentences Cardiac: Regular rate, distal pulses 2+ Integumentary: No rashes on visible skin:  Neurologic: Strength 5/5, sensation 2+ in bilateral upper extremities Psych: Normal affect, mood is described as good Musculoskeletal: Inspection of right wrist reveals no obvious  deformity or muscle atrophy, no warmth, erythema, ecchymosis, or effusion, she is tender to palpation over the extensor compartment of right wrist, radial aspect, specifically tender over the abductor pollicis longus and extensor pollicis brevis, Grind tests negative, Finkelstein positive, she does have full movement of the wrist and fingers without any limitations, grip strength intact, Tinel sign and carpal tunnel and cubital tunnel negative, Tinel sign and [place] negative, no tenderness to palpation along the dorsal or volar forearm  Procedure:  Injection of right 1st dorsal compartment of wrist Consent obtained and verified. Time-out conducted. Noted no overlying erythema, induration, or other signs of local infection. Skin prepped in a sterile fashion. Topical analgesic spray: Ethyl chloride. Completed without difficulty. Meds: 1 cc of 40 mg depomedrol, 1cc 1% lidocaine Pain immediately improved suggesting accurate placement of the medication. Advised to call if fevers/chills, erythema, induration, drainage, or persistent bleeding.  Assessment and plan: 1. Right wrist pain, secondary to De Quervain's tenosynovitis  Plan: Unfortunately her job requirements and obligations is not helpful with the chronic wrist pain that she is having. Ideally, I discussed she needs to lay low for the next few weeks and let the pain and tendon sheath inflammation calm down, but as she does work at [employer] on daily basis this is not feasible for her. There are no other job tasks for her to do that would not require repetitive wrist movement. She is essentially doing aggravating activities for this. She is not able to use a thumb spica wrist splint secondary to the work restrictions. I discussed to try to  wear the thumb spica wrist splint at all other times. I discussed that this would be last time she would be able to get cortisone injection, as if she has any subsequent pain she would need to have  surgical consultation with a hand specialist. Discussed cryotherapy and Aleve use. Will have patient follow up on as needed basis.   Haynes K[place], M.D. Primary Care Sports Medicine Fellow [facility]

## 2017-01-21 ENCOUNTER — Ambulatory Visit: Payer: Self-pay | Admitting: Physician Assistant

## 2017-01-28 ENCOUNTER — Ambulatory Visit: Payer: Self-pay | Admitting: Physician Assistant

## 2017-01-29 ENCOUNTER — Encounter: Payer: Self-pay | Admitting: Physician Assistant

## 2017-01-29 ENCOUNTER — Ambulatory Visit (INDEPENDENT_AMBULATORY_CARE_PROVIDER_SITE_OTHER): Payer: BLUE CROSS/BLUE SHIELD | Admitting: Physician Assistant

## 2017-01-29 VITALS — BP 100/60 | HR 102 | Temp 98.7°F | Resp 16 | Wt 202.8 lb

## 2017-01-29 DIAGNOSIS — F3181 Bipolar II disorder: Secondary | ICD-10-CM

## 2017-01-29 DIAGNOSIS — Z23 Encounter for immunization: Secondary | ICD-10-CM | POA: Diagnosis not present

## 2017-01-29 MED ORDER — LAMOTRIGINE 100 MG PO TABS: 100.0000 mg | ORAL_TABLET | Freq: Every day | ORAL | 5 refills | Status: DC

## 2017-01-29 MED ORDER — ESCITALOPRAM OXALATE 5 MG PO TABS: 5.0000 mg | ORAL_TABLET | Freq: Every day | ORAL | 5 refills | Status: DC

## 2017-01-29 NOTE — Patient Instructions (Signed)
Bipolar 2 Disorder Bipolar 2 disorder is a mental health disorder in which a person has episodes of emotional highs (mania) and lows (depression). Bipolar 2 is different from other bipolar disorders because the manic episodes are not as high and do not last as long. This is called hypomania. People with bipolar 2 disorder usually go back and forth between hypomanic and depressive episodes. What are the causes? The cause of this condition is not known. What increases the risk? The following factors may make you more likely to develop this condition:  Having a family member with the disorder.  An imbalance of certain chemicals in the brain (neurotransmitters).  Stress, such as a death, illness, or financial problems.  Certain conditions that affect the brain or spinal cord (neurologic conditions).  Brain injury (trauma).  Having another mental health disorder, such as: ? Obsessive compulsive disorder. ? Schizophrenia.  What are the signs or symptoms? Symptoms of hypomania include:  Very high self-esteem or self-confidence.  Decreased need for sleep.  Unusual talkativeness or feeling a need to keep talking. Speech may be very fast. It may seem like you cannot stop talking.  Racing thoughts or constant talking, with quick shifts between topics that may or may not be related (flight of ideas).  Decreased ability to focus or concentrate.  Increased purposeful activity, such as work, studies, or social activity.  Increased nonproductive activity. This could be pacing, squirming and fidgeting, or finger and toe tapping.  Impulsive behavior and poor judgment. This may result in high-risk activities, such as having unprotected sex or spending a lot of money.  Symptoms of depression include:  Feeling sad, hopeless, or helpless.  Frequent or uncontrollable crying.  Lack of feeling or caring about anything.  Sleeping too much.  Moving more slowly than usual.  Not being able to  enjoy things you used to enjoy.  Desire to be alone all the time.  Feeling guilty or worthless.  Lack of energy or motivation.  Trouble concentrating or remembering.  Trouble making decisions.  Increased appetite.  Thoughts of death or desire to harm yourself.  How is this diagnosed? To diagnose bipolar 2 disorder, your health care provider may ask about your:  Emotional episodes.  Medical history.  Alcohol and drug use. This includes prescription medicines. Certain medical conditions and substances can cause symptoms that seem like bipolar disorder (secondary bipolar disorder).  How is this treated? Bipolar 2 disorder is a long-term (chronic) illness. It is best controlled with ongoing (continuous) treatment rather than being treated only when symptoms occur. Treatment may include:  Psychotherapy. Some forms of talk therapy, such as cognitive-behavioral therapy (CBT), can provide support, education, and guidance.  Coping strategies, such as journaling or relaxation exercises. Relaxation exercises include: ? Yoga. ? Meditation. ? Deep breathing.  Lifestyle changes, such as: ? Limiting alcohol and drug use. ? Exercising regularly. ? Getting plenty of sleep. ? Making healthy eating choices.  Medicine. Medicine can be prescribed by a health care provider who specializes in treating mental disorders (psychiatrist). ? Medicines called mood stabilizers are usually prescribed. ? If symptoms occur even while taking a mood stabilizer, other medicines may be added.  A combination of medicine, talk therapy, and coping methods is the best way to treat this condition. Follow these instructions at home: Activity  Return to your normal activities as told by your health care provider.  Find activities that you enjoy, and make time to do them.  Exercise regularly as told by your health   care provider. Lifestyle  Limit alcohol intake to no more than 1 drink a day for nonpregnant  women and 2 drinks a day for men. One drink equals 12 oz of beer, 5 oz of wine, or 1 oz of hard liquor.  Follow a set schedule for eating and sleeping.  Eat a balanced diet that includes fresh fruits and vegetables, whole grains, low-fat dairy, and lean meats.  Get at least 7-8 hours of sleep each night. General instructions  Take over-the-counter and prescription medicines only as told by your health care provider.  Think about joining a support group. Your health care provider may be able to recommend a support group.  Talk with your family and loved ones about your treatment goals and how they can help.  Keep all follow-up visits as told by your health care provider. This is important. Where to find more information: For more information about bipolar 2 disorder, visit the following websites:  National Alliance on Mental Illness: www.nami.org  U.S. National Institute of Mental Health: www.nimh.nih.gov  Contact a health care provider if:  Your symptoms get worse.  You have side effects from your medicine, and they get worse.  You have trouble sleeping.  You have trouble doing daily activities.  You feel unsafe in your surroundings.  You are dealing with substance abuse. Get help right away if:  You have new symptoms.  You have thoughts about harming yourself or others.  You harm yourself. Summary  Bipolar 2 disorder is a mental health disorder in which a person has episodes of hypomania and depression.  Bipolar 2 is best treated through a combination of medicines, talk therapy, and coping strategies.  Talk with your family and loved ones about your treatment goals and how they can help. This information is not intended to replace advice given to you by your health care provider. Make sure you discuss any questions you have with your health care provider. Document Released: 03/11/2016 Document Revised: 03/11/2016 Document Reviewed: 03/11/2016 Elsevier Interactive  Patient Education  2018 Elsevier Inc.  
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
## 2017-02-02 ENCOUNTER — Ambulatory Visit: Payer: BLUE CROSS/BLUE SHIELD | Admitting: Psychiatry

## 2017-02-17 NOTE — L&D Delivery Note (Signed)
OB/GYN Faculty Practice Delivery Note  [name] [name] is a 27 y.o. G3P1011 s/p SVD at [redacted]w[redacted]d. She was admitted for spontaneous onset of labor.   ROM: 3h 67m with clear fluid GBS Status: negative Maximum Maternal Temperature: Temp (24hrs), Avg:98.5 F (36.9 C), Min:97.8 F (36.6 C), Max:99.5 F (37.5 C)  Labor Progress: . Admitted with SOL . SROM on L&D clear fluid . Epidural placed  Delivery Date/Time: 12/18/17 at 11:29 Delivery: Called to room and patient was complete and pushing. Head delivered ROA. No nuchal cord present. Shoulder and body delivered in usual fashion. Infant with spontaneous cry, placed on mother's abdomen, dried and stimulated. Cord clamped x 2 after 1-minute delay, and cut by father of baby. Cord blood drawn. Placenta delivered spontaneously with gentle cord traction. Fundus firm with massage and Pitocin. Labia, perineum, vagina, and cervix inspected inspected with no lacerations.   Placenta: spontaneous, intact, 3-vessel cord Complications: none Lacerations: none EBL: 51cc Analgesia: epidural   Postpartum Planning [x]  message to sent to schedule follow-up  [x]  vaccines UTD -- Rh(-) - will follow-up cord blood to determine need for Rhogam   Infant: Vigorous female  APGARs 8,9  weight pending  [name] S. Earlene Plater, DO OB/GYN Fellow, Faculty Practice

## 2017-02-26 ENCOUNTER — Encounter: Payer: Self-pay | Admitting: Physician Assistant

## 2017-02-26 ENCOUNTER — Ambulatory Visit (INDEPENDENT_AMBULATORY_CARE_PROVIDER_SITE_OTHER): Payer: BLUE CROSS/BLUE SHIELD | Admitting: Physician Assistant

## 2017-02-26 VITALS — BP 110/70 | HR 80 | Temp 98.1°F | Resp 16 | Ht 62.0 in | Wt 195.0 lb

## 2017-02-26 DIAGNOSIS — F902 Attention-deficit hyperactivity disorder, combined type: Secondary | ICD-10-CM | POA: Diagnosis not present

## 2017-02-26 DIAGNOSIS — F3181 Bipolar II disorder: Secondary | ICD-10-CM | POA: Diagnosis not present

## 2017-02-26 DIAGNOSIS — Z0000 Encounter for general adult medical examination without abnormal findings: Secondary | ICD-10-CM | POA: Diagnosis not present

## 2017-02-26 MED ORDER — AMPHETAMINE-DEXTROAMPHETAMINE 10 MG PO TABS: 10.0000 mg | ORAL_TABLET | Freq: Every day | ORAL | 0 refills | Status: DC

## 2017-02-26 NOTE — Progress Notes (Signed)
Patient: [name] [name], Female    DOB: [birth year], 27 y.o.   MRN: [id number] Visit Date: 02/26/2017  Today's Provider: Margaretann Loveless, PA-C   Chief Complaint  Patient presents with  . Annual Exam  . Depression   Subjective:    Annual physical exam [name] [name] is a 27 y.o. female who presents today for health maintenance and complete physical. She feels fairly well. She reports exercising walking. She reports she is sleeping fairly well.  01/30/16 CPE 11/21/16 Pap-neg (GYN) -----------------------------------------------------------------   Depression/Bipolar Follow-up  Patient reports that she had a misscarage in October.  Patient reports that she has been suffering of depression since she was 27 yrs old. Patient reports that she has tried several times to come off medications. Patient reports that she knows that its not a good idea but still does it. Patient reports that her goal now is to think positive and try to at least be at her "normal" with her medications. Patient reports that most days she does not want to get up. Patient reports that she does get up and get her son ready for school and walks the dog, pt reports that after that she does nothing.  She  was last seen for this 1 months ago. Changes made at last visit include no changes.   She reports good compliance with treatment. She is not having side effects.   She reports excellent tolerance of treatment. Patient reports that she had a misscarage in October.  Current symptoms include: depressed mood, difficulty concentrating, fatigue and insomnia She feels she is Improved since last visit.  ------------------------------------------------------------------------  Review of Systems  Constitutional: Positive for activity change and appetite change.  HENT: Positive for sore throat.   Eyes: Negative.   Respiratory: Negative.   Cardiovascular: Negative.   Gastrointestinal: Negative.   Endocrine:  Negative.   Genitourinary: Negative.   Musculoskeletal: Negative.   Skin: Negative.   Allergic/Immunologic: Negative.   Neurological: Negative.   Hematological: Negative.   Psychiatric/Behavioral: Positive for agitation and decreased concentration.    Social History      She  reports that she has been smoking cigarettes.  She started smoking about 7 years ago. She has been smoking about 0.50 packs per day. she has never used smokeless tobacco. She reports that she drinks about 2.4 oz of alcohol per week. She reports that she does not use drugs.       Social History   Socioeconomic History  . Marital status: Single    Spouse name: None  . Number of children: None  . Years of education: None  . Highest education level: None  Social Needs  . Financial resource strain: None  . Food insecurity - worry: None  . Food insecurity - inability: None  . Transportation needs - medical: None  . Transportation needs - non-medical: None  Occupational History  . None  Tobacco Use  . Smoking status: Current Every Day Smoker    Packs/day: 0.50    Types: Cigarettes    Start date: 09/06/2009  . Smokeless tobacco: Never Used  Substance and Sexual Activity  . Alcohol use: Yes    Alcohol/week: 2.4 oz    Types: 4 Glasses of wine per week  . Drug use: No  . Sexual activity: Yes    Birth control/protection: Pill  Other Topics Concern  . None  Social History Narrative  . None    Past Medical History:  Diagnosis Date  . ADHD (  attention deficit hyperactivity disorder)   . Anxiety   . Bipolar disorder (HCC)   . Depression   . PMDD (premenstrual dysphoric disorder)      Patient Active Problem List   Diagnosis Date Noted  . Miscarriage 12/01/2016  . De Quervain's tenosynovitis, right 02/28/2016  . Antalgic gait 11/02/2015  . Acne 10/12/2014  . ADD (attention deficit hyperactivity disorder, inattentive type) 10/12/2014  . Allergic rhinitis 10/12/2014  . Post-traumatic arthritis of  right ankle 10/12/2014  . Dysmenorrhea 10/12/2014  . FOM (frequency of micturition) 10/12/2014  . LBP (low back pain) 10/12/2014  . Cannot sleep 10/12/2014  . Bipolar 2 disorder (HCC) 09/08/2014  . Affective disorder (HCC) 09/07/2014    Past Surgical History:  Procedure Laterality Date  . TONSILLECTOMY      Family History        Family Status  Relation Name Status  . Mother  Alive  . Father  Alive  . Sister  Alive  . Brother  Alive  . Sister  Alive  . Sister  Deceased        Her family history includes Alcohol abuse in her father; Anxiety disorder in her brother and mother; Arthritis in her father, sister, sister, and sister; Asthma in her mother and sister; Depression in her father; Diabetes in her father and mother; Endometriosis in her sister; Hypertension in her brother, father, and mother; Uterine cancer (age of onset: 6230) in her mother.     No Known Allergies   Current Outpatient Medications:  .  desogestrel-ethinyl estradiol (ENSKYCE) 0.15-30 MG-MCG tablet, Take 1 tablet by mouth daily., Disp: 3 Package, Rfl: 4 .  escitalopram (LEXAPRO) 5 MG tablet, Take 1 tablet (5 mg total) by mouth daily., Disp: 30 tablet, Rfl: 5 .  lamoTRIgine (LAMICTAL) 100 MG tablet, Take 1 tablet (100 mg total) by mouth daily., Disp: 30 tablet, Rfl: 5 .  meloxicam (MOBIC) 15 MG tablet, Take 1 tablet (15 mg total) by mouth daily. (Patient not taking: Reported on 02/26/2017), Disp: 90 tablet, Rfl: 0   Patient Care Team: Margaretann LovelessBurnette, Jennifer M, PA-C as PCP - General (Family Medicine)      Objective:   Vitals: BP 110/70 (BP Location: Left Arm, Patient Position: Sitting, Cuff Size: Large)   Pulse 80   Temp 98.1 F (36.7 C) (Oral)   Resp 16   Ht 5\' 2"  (1.575 m)   Wt 195 lb (88.5 kg)   BMI 35.67 kg/m    Vitals:   02/26/17 1443  BP: 110/70  Pulse: 80  Resp: 16  Temp: 98.1 F (36.7 C)  TempSrc: Oral  Weight: 195 lb (88.5 kg)  Height: 5\' 2"  (1.575 m)     Physical Exam  Constitutional:  She is oriented to person, place, and time. She appears well-developed and well-nourished. No distress.  HENT:  Head: Normocephalic and atraumatic.  Right Ear: Hearing, tympanic membrane, external ear and ear canal normal.  Left Ear: Hearing, tympanic membrane, external ear and ear canal normal.  Nose: Nose normal.  Mouth/Throat: Uvula is midline, oropharynx is clear and moist and mucous membranes are normal. Abnormal dentition. Dental caries present. No oropharyngeal exudate.  Eyes: Conjunctivae and EOM are normal. Pupils are equal, round, and reactive to light. Right eye exhibits no discharge. Left eye exhibits no discharge. No scleral icterus.  Neck: Normal range of motion. Neck supple. No JVD present. No tracheal deviation present. No thyromegaly present.  Cardiovascular: Normal rate, regular rhythm, normal heart sounds and intact distal pulses.  Exam reveals no gallop and no friction rub.  No murmur heard. Pulmonary/Chest: Effort normal and breath sounds normal. No respiratory distress. She has no wheezes. She has no rales. She exhibits no tenderness. Right breast exhibits no inverted nipple, no mass, no nipple discharge, no skin change and no tenderness. Left breast exhibits no inverted nipple, no mass, no nipple discharge, no skin change and no tenderness.  Abdominal: Soft. Bowel sounds are normal. She exhibits no distension and no mass. There is no tenderness. There is no rebound and no guarding.  Musculoskeletal: Normal range of motion. She exhibits no edema or tenderness.  Lymphadenopathy:    She has no cervical adenopathy.  Neurological: She is alert and oriented to person, place, and time.  Skin: Skin is warm and dry. No rash noted. She is not diaphoretic.  Psychiatric: She has a normal mood and affect. Her behavior is normal. Judgment and thought content normal.  Vitals reviewed.    Depression Screen PHQ 2/9 Scores 02/26/2017 01/29/2017 02/28/2016 11/01/2015  PHQ - 2 Score 5 3 0 0    PHQ- 9 Score 18 8 - -  Exception Documentation - - - Other- indicate reason in comment box      Assessment & Plan:     Routine Health Maintenance and Physical Exam  Exercise Activities and Dietary recommendations Goals    None      Immunization History  Administered Date(s) Administered  . Influenza,inj,Quad PF,6+ Mos 01/29/2017  . Tdap 05/12/2014    Health Maintenance  Topic Date Due  . HIV Screening  11/07/2004  . PAP SMEAR  11/22/2019  . TETANUS/TDAP  05/11/2024  . INFLUENZA VACCINE  Completed     Discussed health benefits of physical activity, and encouraged her to engage in regular exercise appropriate for her age and condition.    1. Annual physical exam Normal physical exam today. Will check labs as below and f/u pending lab results. If labs are stable and WNL she will not need to have these rechecked for one year at her next annual physical exam. She is to call the office in the meantime if she has any acute issue, questions or concerns. - CBC w/Diff/Platelet - Comprehensive Metabolic Panel (CMET) - TSH  2. Attention deficit hyperactivity disorder (ADHD), combined type Patient sent psych notes from Dr. Mayford Knife for review. She had previously been on adderall 10mg . Will restart medication as below. If patient is not doing well I will see her back in 4 weeks.  - amphetamine-dextroamphetamine (ADDERALL) 10 MG tablet; Take 1 tablet (10 mg total) by mouth daily with breakfast.  Dispense: 30 tablet; Refill: 0  3. Bipolar 2 disorder (HCC) Improving on Lamictal and lexapro. Continue current treatment for now.   --------------------------------------------------------------------    Margaretann Loveless, PA-C  [facility]

## 2017-02-26 NOTE — Patient Instructions (Signed)

## 2017-02-27 ENCOUNTER — Telehealth: Payer: Self-pay

## 2017-02-27 LAB — COMPREHENSIVE METABOLIC PANEL
ALT: 11 IU/L (ref 0–32)
AST: 15 IU/L (ref 0–40)
Albumin/Globulin Ratio: 1.9 (ref 1.2–2.2)
Albumin: 4.7 g/dL (ref 3.5–5.5)
Alkaline Phosphatase: 100 IU/L (ref 39–117)
BUN/Creatinine Ratio: 14 (ref 9–23)
BUN: 8 mg/dL (ref 6–20)
Bilirubin Total: 0.2 mg/dL (ref 0.0–1.2)
CALCIUM: 9.4 mg/dL (ref 8.7–10.2)
CO2: 22 mmol/L (ref 20–29)
CREATININE: 0.58 mg/dL (ref 0.57–1.00)
Chloride: 105 mmol/L (ref 96–106)
GFR, EST AFRICAN AMERICAN: 146 mL/min/{1.73_m2} (ref >59)
GFR, EST NON AFRICAN AMERICAN: 127 mL/min/{1.73_m2} (ref >59)
Globulin, Total: 2.5 g/dL (ref 1.5–4.5)
Glucose: 96 mg/dL (ref 65–99)
Potassium: 4 mmol/L (ref 3.5–5.2)
Sodium: 140 mmol/L (ref 134–144)
TOTAL PROTEIN: 7.2 g/dL (ref 6.0–8.5)

## 2017-02-27 LAB — CBC WITH DIFFERENTIAL/PLATELET
BASOS: 0 %
Basophils Absolute: 0 10*3/uL (ref 0.0–0.2)
EOS (ABSOLUTE): 0.2 10*3/uL (ref 0.0–0.4)
EOS: 2 %
HEMATOCRIT: 41.3 % (ref 34.0–46.6)
Hemoglobin: 13.5 g/dL (ref 11.1–15.9)
IMMATURE GRANS (ABS): 0 10*3/uL (ref 0.0–0.1)
IMMATURE GRANULOCYTES: 0 %
LYMPHS: 31 %
Lymphocytes Absolute: 2.6 10*3/uL (ref 0.7–3.1)
MCH: 24 pg — ABNORMAL LOW (ref 26.6–33.0)
MCHC: 32.7 g/dL (ref 31.5–35.7)
MCV: 74 fL — ABNORMAL LOW (ref 79–97)
Monocytes Absolute: 0.5 10*3/uL (ref 0.1–0.9)
Monocytes: 6 %
NEUTROS PCT: 61 %
Neutrophils Absolute: 5 10*3/uL (ref 1.4–7.0)
PLATELETS: 193 10*3/uL (ref 150–379)
RBC: 5.62 x10E6/uL — ABNORMAL HIGH (ref 3.77–5.28)
RDW: 16.2 % — ABNORMAL HIGH (ref 12.3–15.4)
WBC: 8.3 10*3/uL (ref 3.4–10.8)

## 2017-02-27 LAB — TSH: TSH: 0.942 u[IU]/mL (ref 0.450–4.500)

## 2017-02-27 NOTE — Telephone Encounter (Signed)
Patient advised as directed below.  Thanks,  -Mardy Lucier 

## 2017-02-27 NOTE — Telephone Encounter (Signed)
-----   Message from Margaretann LovelessJennifer M Burnette, [place]PA-C sent at 02/27/2017  9:03 AM EST ----- All labs are within normal limits and stable.  Thanks! -JB
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
## 2017-04-13 ENCOUNTER — Telehealth: Payer: Self-pay | Admitting: Physician Assistant

## 2017-04-13 DIAGNOSIS — F902 Attention-deficit hyperactivity disorder, combined type: Secondary | ICD-10-CM

## 2017-04-13 MED ORDER — AMPHETAMINE-DEXTROAMPHETAMINE 10 MG PO TABS: 10.0000 mg | ORAL_TABLET | Freq: Every day | ORAL | 0 refills | Status: DC

## 2017-04-13 NOTE — Telephone Encounter (Signed)
Pt stated that she called back to give us the number to the phone she is currently using because her cell phone is turned off due to payment. CB# [phone] Please advise. Thanks TNP

## 2017-04-13 NOTE — Telephone Encounter (Signed)
Patient requesting refills on her amphetamine-dextroamphetamine (ADDERALL) 10 MG tablet    CB# [phone]

## 2017-04-13 NOTE — Telephone Encounter (Signed)
Rx printed x 3. Should not need refills until 06/2017 

## 2017-04-13 NOTE — Telephone Encounter (Signed)
Lm prescriptions placed up front ready for pick up.  Thanks,  -Shelbylynn Walczyk

## 2017-05-15 ENCOUNTER — Encounter: Payer: Self-pay | Admitting: Advanced Practice Midwife

## 2017-05-15 LAB — OB RESULTS CONSOLE RPR: RPR: NONREACTIVE

## 2017-05-15 LAB — OB RESULTS CONSOLE HIV ANTIBODY (ROUTINE TESTING): HIV: NONREACTIVE

## 2017-05-15 LAB — OB RESULTS CONSOLE HEPATITIS B SURFACE ANTIGEN: Hepatitis B Surface Ag: NEGATIVE

## 2017-05-15 LAB — OB RESULTS CONSOLE ABO/RH: RH TYPE: NEGATIVE

## 2017-05-15 LAB — OB RESULTS CONSOLE ANTIBODY SCREEN: Antibody Screen: NEGATIVE

## 2017-05-15 LAB — OB RESULTS CONSOLE RUBELLA ANTIBODY, IGM: Rubella: IMMUNE

## 2017-06-17 ENCOUNTER — Ambulatory Visit (INDEPENDENT_AMBULATORY_CARE_PROVIDER_SITE_OTHER): Payer: BLUE CROSS/BLUE SHIELD | Admitting: Advanced Practice Midwife

## 2017-06-17 ENCOUNTER — Ambulatory Visit: Payer: BLUE CROSS/BLUE SHIELD | Admitting: *Deleted

## 2017-06-17 ENCOUNTER — Encounter: Payer: Self-pay | Admitting: Advanced Practice Midwife

## 2017-06-17 VITALS — BP 104/74 | Wt 189.5 lb

## 2017-06-17 DIAGNOSIS — F319 Bipolar disorder, unspecified: Secondary | ICD-10-CM

## 2017-06-17 DIAGNOSIS — O99341 Other mental disorders complicating pregnancy, first trimester: Secondary | ICD-10-CM

## 2017-06-17 DIAGNOSIS — O99331 Smoking (tobacco) complicating pregnancy, first trimester: Secondary | ICD-10-CM | POA: Diagnosis not present

## 2017-06-17 DIAGNOSIS — Z3682 Encounter for antenatal screening for nuchal translucency: Secondary | ICD-10-CM

## 2017-06-17 DIAGNOSIS — F17210 Nicotine dependence, cigarettes, uncomplicated: Secondary | ICD-10-CM

## 2017-06-17 DIAGNOSIS — Z3481 Encounter for supervision of other normal pregnancy, first trimester: Secondary | ICD-10-CM

## 2017-06-17 DIAGNOSIS — Z1389 Encounter for screening for other disorder: Secondary | ICD-10-CM

## 2017-06-17 DIAGNOSIS — F329 Major depressive disorder, single episode, unspecified: Secondary | ICD-10-CM

## 2017-06-17 DIAGNOSIS — Z3490 Encounter for supervision of normal pregnancy, unspecified, unspecified trimester: Secondary | ICD-10-CM | POA: Insufficient documentation

## 2017-06-17 DIAGNOSIS — Z3A13 13 weeks gestation of pregnancy: Secondary | ICD-10-CM

## 2017-06-17 DIAGNOSIS — Z331 Pregnant state, incidental: Secondary | ICD-10-CM

## 2017-06-17 LAB — POCT URINALYSIS DIPSTICK
Glucose, UA: NEGATIVE
Ketones, UA: NEGATIVE
LEUKOCYTES UA: NEGATIVE
NITRITE UA: NEGATIVE
PROTEIN UA: NEGATIVE
RBC UA: NEGATIVE

## 2017-06-17 NOTE — Patient Instructions (Signed)
 First Trimester of Pregnancy The first trimester of pregnancy is from week 1 until the end of week 12 (months 1 through 3). A week after a sperm fertilizes an egg, the egg will implant on the wall of the uterus. This embryo will begin to develop into a baby. Genes from you and your partner are forming the baby. The female genes determine whether the baby is a boy or a girl. At 6-8 weeks, the eyes and face are formed, and the heartbeat can be seen on ultrasound. At the end of 12 weeks, all the baby's organs are formed.  Now that you are pregnant, you will want to do everything you can to have a healthy baby. Two of the most important things are to get good prenatal care and to follow your health care provider's instructions. Prenatal care is all the medical care you receive before the baby's birth. This care will help prevent, find, and treat any problems during the pregnancy and childbirth. BODY CHANGES Your body goes through many changes during pregnancy. The changes vary from woman to woman.   You may gain or lose a couple of pounds at first.  You may feel sick to your stomach (nauseous) and throw up (vomit). If the vomiting is uncontrollable, call your health care provider.  You may tire easily.  You may develop headaches that can be relieved by medicines approved by your health care provider.  You may urinate more often. Painful urination may mean you have a bladder infection.  You may develop heartburn as a result of your pregnancy.  You may develop constipation because certain hormones are causing the muscles that push waste through your intestines to slow down.  You may develop hemorrhoids or swollen, bulging veins (varicose veins).  Your breasts may begin to grow larger and become tender. Your nipples may stick out more, and the tissue that surrounds them (areola) may become darker.  Your gums may bleed and may be sensitive to brushing and flossing.  Dark spots or blotches  (chloasma, mask of pregnancy) may develop on your face. This will likely fade after the baby is born.  Your menstrual periods will stop.  You may have a loss of appetite.  You may develop cravings for certain kinds of food.  You may have changes in your emotions from day to day, such as being excited to be pregnant or being concerned that something may go wrong with the pregnancy and baby.  You may have more vivid and strange dreams.  You may have changes in your hair. These can include thickening of your hair, rapid growth, and changes in texture. Some women also have hair loss during or after pregnancy, or hair that feels dry or thin. Your hair will most likely return to normal after your baby is born. WHAT TO EXPECT AT YOUR PRENATAL VISITS During a routine prenatal visit:  You will be weighed to make sure you and the baby are growing normally.  Your blood pressure will be taken.  Your abdomen will be measured to track your baby's growth.  The fetal heartbeat will be listened to starting around week 10 or 12 of your pregnancy.  Test results from any previous visits will be discussed. Your health care provider may ask you:  How you are feeling.  If you are feeling the baby move.  If you have had any abnormal symptoms, such as leaking fluid, bleeding, severe headaches, or abdominal cramping.  If you have any questions. Other   tests that may be performed during your first trimester include:  Blood tests to find your blood type and to check for the presence of any previous infections. They will also be used to check for low iron levels (anemia) and Rh antibodies. Later in the pregnancy, blood tests for diabetes will be done along with other tests if problems develop.  Urine tests to check for infections, diabetes, or protein in the urine.  An ultrasound to confirm the proper growth and development of the baby.  An amniocentesis to check for possible genetic problems.  Fetal  screens for spina bifida and Down syndrome.  You may need other tests to make sure you and the baby are doing well. HOME CARE INSTRUCTIONS  Medicines  Follow your health care provider's instructions regarding medicine use. Specific medicines may be either safe or unsafe to take during pregnancy.  Take your prenatal vitamins as directed.  If you develop constipation, try taking a stool softener if your health care provider approves. Diet  Eat regular, well-balanced meals. Choose a variety of foods, such as meat or vegetable-based protein, fish, milk and low-fat dairy products, vegetables, fruits, and whole grain breads and cereals. Your health care provider will help you determine the amount of weight gain that is right for you.  Avoid raw meat and uncooked cheese. These carry germs that can cause birth defects in the baby.  Eating four or five small meals rather than three large meals a day may help relieve nausea and vomiting. If you start to feel nauseous, eating a few soda crackers can be helpful. Drinking liquids between meals instead of during meals also seems to help nausea and vomiting.  If you develop constipation, eat more high-fiber foods, such as fresh vegetables or fruit and whole grains. Drink enough fluids to keep your urine clear or pale yellow. Activity and Exercise  Exercise only as directed by your health care provider. Exercising will help you:  Control your weight.  Stay in shape.  Be prepared for labor and delivery.  Experiencing pain or cramping in the lower abdomen or low back is a good sign that you should stop exercising. Check with your health care provider before continuing normal exercises.  Try to avoid standing for long periods of time. Move your legs often if you must stand in one place for a long time.  Avoid heavy lifting.  Wear low-heeled shoes, and practice good posture.  You may continue to have sex unless your health care provider directs you  otherwise. Relief of Pain or Discomfort  Wear a good support bra for breast tenderness.   Take warm sitz baths to soothe any pain or discomfort caused by hemorrhoids. Use hemorrhoid cream if your health care provider approves.   Rest with your legs elevated if you have leg cramps or low back pain.  If you develop varicose veins in your legs, wear support hose. Elevate your feet for 15 minutes, 3-4 times a day. Limit salt in your diet. Prenatal Care  Schedule your prenatal visits by the twelfth week of pregnancy. They are usually scheduled monthly at first, then more often in the last 2 months before delivery.  Write down your questions. Take them to your prenatal visits.  Keep all your prenatal visits as directed by your health care provider. Safety  Wear your seat belt at all times when driving.  Make a list of emergency phone numbers, including numbers for family, friends, the hospital, and police and fire departments. General   Tips  Ask your health care provider for a referral to a local prenatal education class. Begin classes no later than at the beginning of month 6 of your pregnancy.  Ask for help if you have counseling or nutritional needs during pregnancy. Your health care provider can offer advice or refer you to specialists for help with various needs.  Do not use hot tubs, steam rooms, or saunas.  Do not douche or use tampons or scented sanitary pads.  Do not cross your legs for long periods of time.  Avoid cat litter boxes and soil used by cats. These carry germs that can cause birth defects in the baby and possibly loss of the fetus by miscarriage or stillbirth.  Avoid all smoking, herbs, alcohol, and medicines not prescribed by your health care provider. Chemicals in these affect the formation and growth of the baby.  Schedule a dentist appointment. At home, brush your teeth with a soft toothbrush and be gentle when you floss. SEEK MEDICAL CARE IF:   You have  dizziness.  You have mild pelvic cramps, pelvic pressure, or nagging pain in the abdominal area.  You have persistent nausea, vomiting, or diarrhea.  You have a bad smelling vaginal discharge.  You have pain with urination.  You notice increased swelling in your face, hands, legs, or ankles. SEEK IMMEDIATE MEDICAL CARE IF:   You have a fever.  You are leaking fluid from your vagina.  You have spotting or bleeding from your vagina.  You have severe abdominal cramping or pain.  You have rapid weight gain or loss.  You vomit blood or material that looks like coffee grounds.  You are exposed to German measles and have never had them.  You are exposed to fifth disease or chickenpox.  You develop a severe headache.  You have shortness of breath.  You have any kind of trauma, such as from a fall or a car accident. Document Released: 01/28/2001 Document Revised: 06/20/2013 Document Reviewed: 12/14/2012 ExitCare Patient Information 2015 [employer]. This information is not intended to replace advice given to you by your health care provider. Make sure you discuss any questions you have with your health care provider.   Nausea & Vomiting  Have saltine crackers or pretzels by your bed and eat a few bites before you raise your head out of bed in the morning  Eat small frequent meals throughout the day instead of large meals  Drink plenty of fluids throughout the day to stay hydrated, just don't drink a lot of fluids with your meals.  This can make your stomach fill up faster making you feel sick  Do not brush your teeth right after you eat  Products with real ginger are good for nausea, like ginger ale and ginger hard candy Make sure it says made with real ginger!  Sucking on sour candy like lemon heads is also good for nausea  If your prenatal vitamins make you nauseated, take them at night so you will sleep through the nausea  Sea Bands  If you feel like you need  medicine for the nausea & vomiting please let us know  If you are unable to keep any fluids or food down please let us know   Constipation  Drink plenty of fluid, preferably water, throughout the day  Eat foods high in fiber such as fruits, vegetables, and grains  Exercise, such as walking, is a good way to keep your bowels regular  Drink warm fluids, especially warm   prune juice, or decaf coffee  Eat a 1/2 cup of real oatmeal (not instant), 1/2 cup applesauce, and 1/2-1 cup warm prune juice every day  If needed, you may take Colace (docusate sodium) stool softener once or twice a day to help keep the stool soft. If you are pregnant, wait until you are out of your first trimester (12-14 weeks of pregnancy)  If you still are having problems with constipation, you may take Miralax once daily as needed to help keep your bowels regular.  If you are pregnant, wait until you are out of your first trimester (12-14 weeks of pregnancy)  Safe Medications in Pregnancy   Acne: Benzoyl Peroxide Salicylic Acid  Backache/Headache: Tylenol: 2 regular strength every 4 hours OR              2 Extra strength every 6 hours  Colds/Coughs/Allergies: Benadryl (alcohol free) 25 mg every 6 hours as needed Breath right strips Claritin Cepacol throat lozenges Chloraseptic throat spray Cold-Eeze- up to three times per day Cough drops, alcohol free Flonase (by prescription only) Guaifenesin Mucinex Robitussin DM (plain only, alcohol free) Saline nasal spray/drops Sudafed (pseudoephedrine) & Actifed ** use only after [redacted] weeks gestation and if you do not have high blood pressure Tylenol Vicks Vaporub Zinc lozenges Zyrtec   Constipation: Colace Ducolax suppositories Fleet enema Glycerin suppositories Metamucil Milk of magnesia Miralax Senokot Smooth move tea  Diarrhea: Kaopectate Imodium A-D  *NO pepto Bismol  Hemorrhoids: Anusol Anusol HC Preparation  H Tucks  Indigestion: Tums Maalox Mylanta Zantac  Pepcid  Insomnia: Benadryl (alcohol free) 25mg every 6 hours as needed Tylenol PM Unisom, no Gelcaps  Leg Cramps: Tums MagGel  Nausea/Vomiting:  Bonine Dramamine Emetrol Ginger extract Sea bands Meclizine  Nausea medication to take during pregnancy:  Unisom (doxylamine succinate 25 mg tablets) Take one tablet daily at bedtime. If symptoms are not adequately controlled, the dose can be increased to a maximum recommended dose of two tablets daily (1/2 tablet in the morning, 1/2 tablet mid-afternoon and one at bedtime). Vitamin B6 100mg tablets. Take one tablet twice a day (up to 200 mg per day).  Skin Rashes: Aveeno products Benadryl cream or 25mg every 6 hours as needed Calamine Lotion 1% cortisone cream  Yeast infection: Gyne-lotrimin 7 Monistat 7   **If taking multiple medications, please check labels to avoid duplicating the same active ingredients **take medication as directed on the label ** Do not exceed 4000 mg of tylenol in 24 hours **Do not take medications that contain aspirin or ibuprofen      

## 2017-06-17 NOTE — Addendum Note (Signed)
Addended by: Moss Mc on: 06/17/2017 11:51 AM   Modules accepted: Orders

## 2017-06-17 NOTE — Progress Notes (Signed)
Subjective:    [name] [name] is a G3P1011 [redacted]w[redacted]d being seen today for her first obstetrical visit.  Her obstetrical history is significant for smoker.  Used to smoke 1/2 PPD now smoking 1-2/day.  Quitting strategies discussed, including patches/e cigs. Pregnancy history fully reviewed. Hx delivery w/o problems. Delivery record says 9#5oz, but pt says that is definitely wrong, probably more likely 5 # 9 oz  Dx w/bipolar, ADD/depression.  Quit meds.  Offered therapy referral for [facility] and accepted. Feels like she is doing OK,  Stable relationship, lots of support.   Vitals:   06/17/17 1004  BP: 104/74  Weight: 189 lb 8 oz (86 kg)    HISTORY: OB History  Gravida Para Term Preterm AB Living  SAB TAB Ectopic Multiple Live Births  1       1    # Outcome Date GA Lbr Len/2nd Weight Sex Delivery Anes PTL Lv  3 Current           2 SAB 11/2016          1 Term 10/18/10 [redacted]w[redacted]d  9 lb 5 oz (4.224 kg) M Vag-Spont EPI N LIV   Past Medical History:  Diagnosis Date  . ADD (attention deficit disorder)   . ADHD (attention deficit hyperactivity disorder)   . Anxiety   . Bipolar disorder (HCC)   . Depression    manid depressive w/ generalized anxiety  . Frequent UTI   . PMDD (premenstrual dysphoric disorder)    Past Surgical History:  Procedure Laterality Date  . TONSILLECTOMY     Family History  Problem Relation Age of Onset  . Hypertension Mother   . Diabetes Mother   . Anxiety disorder Mother   . Asthma Mother   . Uterine cancer Mother 38       uterine or cervical  . Cancer Mother        ovarian  . Diabetes Father   . Hypertension Father   . Alcohol abuse Father   . Depression Father   . Arthritis Father   . Cancer Father        throat  . Arthritis Sister   . Endometriosis Sister   . Diabetes Sister   . Anxiety disorder Brother   . Hypertension Brother   . Arthritis Sister   . Arthritis Sister   . Asthma Sister   . Diabetes Paternal Uncle   . ADD  / ADHD Son      Exam                                      System:     Skin: normal coloration and turgor, no rashes    Neurologic: oriented, normal, normal mood   Extremities: normal strength, tone, and muscle mass   HEENT PERRLA   Mouth/Teeth mucous membranes moist, fair dentition   Neck supple and no masses   Cardiovascular: regular rate and rhythm   Respiratory:  appears well, vitals normal, no respiratory distress, acyanotic   Abdomen: soft, non-tender;  FHR: 150 [place]        The nature of [facility] with multiple MDs and other Advanced Practice Providers was explained to patient; also emphasized that residents, students are part of our team.  Assessment:    Pregnancy: G3P1011 Patient Active Problem List   Diagnosis Date  Noted  . Supervision of normal pregnancy 06/17/2017  . De Quervain's tenosynovitis, right 02/28/2016  . Antalgic gait 11/02/2015  . Acne 10/12/2014  . ADD (attention deficit hyperactivity disorder, inattentive type) 10/12/2014  . Allergic rhinitis 10/12/2014  . 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 Dysmenorrhea 10/12/2014  . FOM (frequency of micturition) 10/12/2014  . LBP (low back pain) 10/12/2014  . Cannot sleep 10/12/2014  . Bipolar 2 disorder (HCC) 09/08/2014  . Affective disorder (HCC) 09/07/2014        Plan:     Initial labs drawn. Continue prenatal vitamins  Problem list reviewed and updated  Reviewed n/v relief measures and warning s/s to report  Reviewed recommended weight gain based on pre-gravid BMI  Encouraged well-balanced diet Genetic Screening discussed Integrated Screen: requested.  Ultrasound discussed; fetal survey: requested.  Return for any time next week for NT/IT [place] only; 5 weeks for LROB.  Jacklyn Shell 06/17/2017

## 2017-06-18 ENCOUNTER — Other Ambulatory Visit: Payer: BLUE CROSS/BLUE SHIELD

## 2017-06-18 ENCOUNTER — Ambulatory Visit (INDEPENDENT_AMBULATORY_CARE_PROVIDER_SITE_OTHER): Payer: BLUE CROSS/BLUE SHIELD

## 2017-06-18 DIAGNOSIS — Z3682 Encounter for antenatal screening for nuchal translucency: Secondary | ICD-10-CM

## 2017-06-18 DIAGNOSIS — Z3A13 13 weeks gestation of pregnancy: Secondary | ICD-10-CM

## 2017-06-18 DIAGNOSIS — Z1379 Encounter for other screening for genetic and chromosomal anomalies: Secondary | ICD-10-CM

## 2017-06-18 DIAGNOSIS — Z3402 Encounter for supervision of normal first pregnancy, second trimester: Secondary | ICD-10-CM

## 2017-06-18 LAB — PMP SCREEN PROFILE (10S), URINE
Amphetamine Scrn, Ur: NEGATIVE ng/mL
BARBITURATE SCREEN URINE: NEGATIVE ng/mL
BENZODIAZEPINE SCREEN, URINE: NEGATIVE ng/mL
CANNABINOIDS UR QL SCN: POSITIVE ng/mL — AB
COCAINE(METAB.)SCREEN, URINE: NEGATIVE ng/mL
CREATININE(CRT), U: 57.4 mg/dL (ref 20.0–300.0)
Methadone Screen, Urine: NEGATIVE ng/mL
OPIATE SCREEN URINE: NEGATIVE ng/mL
OXYCODONE+OXYMORPHONE UR QL SCN: NEGATIVE ng/mL
Ph of Urine: 6.9 (ref 4.5–8.9)
Phencyclidine Qn, Ur: NEGATIVE ng/mL
Propoxyphene Scrn, Ur: NEGATIVE ng/mL

## 2017-06-18 LAB — MED LIST OPTION NOT SELECTED

## 2017-06-18 NOTE — Progress Notes (Signed)
[place] 13+1 wks,measurements c/w dates,normal ovaries bilat,crl 71.40 mm,fhr 157 bpm,NB present,NT 1.3 mm,posterior pl gr 0

## 2017-06-20 LAB — INTEGRATED 1
Crown Rump Length: 71.4 mm
Gest. Age on Collection Date: 13.1 weeks
Maternal Age at EDD: 28.1 yr
NUCHAL TRANSLUCENCY (NT): 1.3 mm
Number of Fetuses: 1
PAPP-A Value: 227.6 ng/mL
WEIGHT: 189 [lb_av]

## 2017-06-22 ENCOUNTER — Telehealth: Payer: Self-pay

## 2017-06-22 ENCOUNTER — Telehealth: Payer: Self-pay | Admitting: Advanced Practice Midwife

## 2017-06-22 NOTE — Telephone Encounter (Signed)
Patient called requesting to restart medications for Bipolar and Depression. Patient reports that she has been off medications since start of pregnancy. Patient reports being very anxious and tearful. Patient denies any suicide thoughts. Patient was on Lamictal 100 mg daily, and Lexapro 5 mg daily. Patient knows she can not take Lamictal but reports that Lexapro alone does not help. Please advise. CB# [phone]

## 2017-06-22 NOTE — Telephone Encounter (Signed)
Patient advised as below.  

## 2017-06-22 NOTE — Telephone Encounter (Signed)
LMOVM returning patient's call.  

## 2017-06-22 NOTE — Telephone Encounter (Signed)
Has to call OB for this.

## 2017-06-22 NOTE — Telephone Encounter (Signed)
Pt called stating that she is going crazy and needs to get back on her lamictal and lexapro. Advised pt that since she is in her 1st trimester I will have to send her request to a provider. Advised that I would call her back and let her know. Pt verbalized understanding

## 2017-06-23 ENCOUNTER — Other Ambulatory Visit: Payer: Self-pay | Admitting: Advanced Practice Midwife

## 2017-06-23 DIAGNOSIS — F3181 Bipolar II disorder: Secondary | ICD-10-CM

## 2017-06-23 MED ORDER — LAMOTRIGINE 100 MG PO TABS: 100.0000 mg | ORAL_TABLET | Freq: Every day | ORAL | 5 refills | Status: DC

## 2017-06-23 MED ORDER — ESCITALOPRAM OXALATE 5 MG PO TABS: 5.0000 mg | ORAL_TABLET | Freq: Every day | ORAL | 5 refills | Status: DC

## 2017-06-23 NOTE — Telephone Encounter (Signed)
I refilled both 

## 2017-06-23 NOTE — Progress Notes (Signed)
Pt called and really needs to be back on her lamictal and lexapro.  Had already discussed risk/benefits.  Reordered both.

## 2017-06-23 NOTE — Telephone Encounter (Signed)
Informed pt that refills on lexapro and lamictal had been sent to pharmacy.
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
## 2017-07-16 ENCOUNTER — Ambulatory Visit (INDEPENDENT_AMBULATORY_CARE_PROVIDER_SITE_OTHER): Payer: BLUE CROSS/BLUE SHIELD | Admitting: Obstetrics and Gynecology

## 2017-07-16 ENCOUNTER — Encounter: Payer: Self-pay | Admitting: Obstetrics and Gynecology

## 2017-07-16 VITALS — BP 110/60 | Temp 98.6°F | Wt 186.0 lb

## 2017-07-16 DIAGNOSIS — J019 Acute sinusitis, unspecified: Secondary | ICD-10-CM

## 2017-07-16 DIAGNOSIS — O99512 Diseases of the respiratory system complicating pregnancy, second trimester: Secondary | ICD-10-CM

## 2017-07-16 DIAGNOSIS — Z331 Pregnant state, incidental: Secondary | ICD-10-CM

## 2017-07-16 DIAGNOSIS — Z3A17 17 weeks gestation of pregnancy: Secondary | ICD-10-CM

## 2017-07-16 DIAGNOSIS — Z3402 Encounter for supervision of normal first pregnancy, second trimester: Secondary | ICD-10-CM

## 2017-07-16 DIAGNOSIS — Z1389 Encounter for screening for other disorder: Secondary | ICD-10-CM

## 2017-07-16 LAB — POCT URINALYSIS DIPSTICK
Glucose, UA: NEGATIVE
KETONES UA: NEGATIVE
Leukocytes, UA: NEGATIVE
Nitrite, UA: NEGATIVE
Protein, UA: POSITIVE — AB
RBC UA: NEGATIVE

## 2017-07-16 NOTE — Progress Notes (Signed)
Patient ID: [name] [name], female   DOB: [birth year], 27 y.o.   MRN: [id number]   LOW-RISK PREGNANCY VISIT Patient name: [name] [name] MRN [id number]  Date of birth: Oct 17, 1990 Chief Complaint:   work-in-ob (sinus infection)  History of Present Illness:   [name] is a 27 y.o. G60P1011 female at [redacted]w[redacted]d with an Estimated Date of Delivery: 12/23/17 being seen today for ongoing management of a low-risk pregnancy.  Today she reports a sinus infection for the last week. She reports that everyone in her family has had the same symptoms. She was taking children's medication and was hoping it would go away on its own. Her symptoms have been improving. However, the patient's mother insisted she come in for a check-up.  .  .   . . Review of Systems:   Pertinent items are noted in HPI Denies abnormal vaginal discharge w/ itching/odor/irritation, headaches, visual changes, shortness of breath, chest pain, abdominal pain, severe nausea/vomiting, or problems with urination or bowel movements unless otherwise stated above. Pertinent History Reviewed:  Reviewed past medical,surgical, social, obstetrical and family history.  Reviewed problem list, medications and allergies. Physical Assessment:   Vitals:   07/16/17 1321  BP: 110/60  Temp: 98.6 F (37 C)  Weight: 186 lb (84.4 kg)  Body mass index is 34.02 kg/m.        Physical Examination:   General appearance: Well appearing, and in no distress  Mental status: Alert, oriented to person, place, and time  Skin: Warm & dry  Cardiovascular: Normal heart rate noted  Respiratory: Normal respiratory effort, no distress  Ears: Tm's are not red. Cleared without retraction             PHarynx: clear without exudate. S/p tonsillectomy   Abdomen: Soft, gravid, nontender  Pelvic: Cervical exam deferred         Extremities: Edema: None  Fetal Status: Fetal Heart Rate (bpm): 155        Results for orders placed or performed in visit on 07/16/17 (from the  past 24 hour(s))  POCT urinalysis dipstick   Collection Time: 07/16/17  1:32 PM  Result Value Ref Range   Color, UA     Clarity, UA     Glucose, UA Negative Negative   Bilirubin, UA     Ketones, UA neg    Spec Grav, UA  1.010 - 1.025   Blood, UA neg    pH, UA  5.0 - 8.0   Protein, UA Positive (A) Negative   Urobilinogen, UA  0.2 or 1.0 E.U./dL   Nitrite, UA neg    Leukocytes, UA Negative Negative   Appearance     Odor      Assessment & Plan:  1) Low-risk pregnancy G3P1011 at [redacted]w[redacted]d with an Estimated Date of Delivery: 12/23/17  2. Resolving sinus infection, no need for antibiotics  Meds: No orders of the defined types were placed in this encounter.  Labs/procedures today: none  Plan:  Continue routine obstetrical care  Follow-up: Return in about 6 days (around 07/22/2017). as scheduled for anatomy scan  Orders Placed This Encounter  Procedures  . POCT urinalysis dipstick   By signing my name below, I, Diona Browner, attest that this documentation has been prepared under the direction and in the presence of Tilda Burrow, MD. Electronically Signed: Diona Browner, Medical Scribe. 07/16/17. 1:48 PM.  I personally performed the services described in this documentation, which was SCRIBED in my presence. The recorded information has been reviewed and considered  accurate. It has been edited as necessary during review. Jonnie Kind, MD

## 2017-07-22 ENCOUNTER — Encounter: Payer: BLUE CROSS/BLUE SHIELD | Admitting: Advanced Practice Midwife

## 2017-07-24 ENCOUNTER — Ambulatory Visit (INDEPENDENT_AMBULATORY_CARE_PROVIDER_SITE_OTHER): Payer: BLUE CROSS/BLUE SHIELD | Admitting: Obstetrics and Gynecology

## 2017-07-24 ENCOUNTER — Encounter: Payer: Self-pay | Admitting: Obstetrics and Gynecology

## 2017-07-24 VITALS — BP 96/52 | HR 73 | Wt 182.6 lb

## 2017-07-24 DIAGNOSIS — Z331 Pregnant state, incidental: Secondary | ICD-10-CM

## 2017-07-24 DIAGNOSIS — Z3402 Encounter for supervision of normal first pregnancy, second trimester: Secondary | ICD-10-CM

## 2017-07-24 DIAGNOSIS — Z1389 Encounter for screening for other disorder: Secondary | ICD-10-CM

## 2017-07-24 DIAGNOSIS — Z3A18 18 weeks gestation of pregnancy: Secondary | ICD-10-CM

## 2017-07-24 DIAGNOSIS — Z3482 Encounter for supervision of other normal pregnancy, second trimester: Secondary | ICD-10-CM

## 2017-07-24 LAB — POCT URINALYSIS DIPSTICK
Glucose, UA: NEGATIVE
Ketones, UA: NEGATIVE
LEUKOCYTES UA: NEGATIVE
NITRITE UA: NEGATIVE
Protein, UA: NEGATIVE
RBC UA: NEGATIVE

## 2017-07-24 NOTE — Progress Notes (Signed)
Patient ID: [name] GingerCheryl Mankins, female   DOB: Nov 20, 1990, 27 y.o.   MRN: [id number]    LOW-RISK PREGNANCY VISIT Patient name: [name] [name] MRN [id number]  Date of birth: Nov 20, 1990 Chief Complaint:   low risk ob  History of Present Illness:   [name] [name] is a 27 y.o. 613P1011 female at 4773w2d with an Estimated Date of Delivery: 12/23/17 being seen today for ongoing management of a low-risk pregnancy. Patient was feeling unwell last week but is now feeling better Today she reports no complaints.  .  .   . denies leaking of fluid. Review of Systems:   Pertinent items are noted in HPI Denies abnormal vaginal discharge w/ itching/odor/irritation, headaches, visual changes, shortness of breath, chest pain, abdominal pain, severe nausea/vomiting, or problems with urination or bowel movements unless otherwise stated above. Pertinent History Reviewed:  Reviewed past medical,surgical, social, obstetrical and family history.  Reviewed problem list, medications and allergies. Physical Assessment:   Vitals:   07/24/17 0919  BP: (!) 96/52  Pulse: 73  Weight: 182 lb 9.6 oz (82.8 kg)  Body mass index is 33.4 kg/m.        Physical Examination:   General appearance: Well appearing, and in no distress  Mental status: Alert, oriented to person, place, and time  Skin: Warm & dry  Cardiovascular: Normal heart rate noted  Respiratory: Normal respiratory effort, no distress  Abdomen: Soft, gravid, nontender  Pelvic: Cervical exam deferred         Extremities: Edema: Trace  Fetal Status: Fetal Heart Rate (bpm): 144        Results for orders placed or performed in visit on 07/24/17 (from the past 24 hour(s))  POCT urinalysis dipstick   Collection Time: 07/24/17  9:20 AM  Result Value Ref Range   Color, UA     Clarity, UA     Glucose, UA Negative Negative   Bilirubin, UA     Ketones, UA neg    Spec Grav, UA  1.010 - 1.025   Blood, UA neg    pH, UA  5.0 - 8.0   Protein, UA Negative Negative   Urobilinogen, UA  0.2 or 1.0 E.U./dL   Nitrite, UA neg    Leukocytes, UA Negative Negative   Appearance     Odor      Assessment & Plan:  1) Low-risk pregnancy G3P1011 at 8773w2d with an Estimated Date of Delivery: 12/23/17    Meds: No orders of the defined types were placed in this encounter.  Labs/procedures today:None  Plan:  Continue routine obstetrical care  Follow-up: Return in about 1 month (around 08/21/2017).  Orders Placed This Encounter  Procedures  . POCT urinalysis dipstick   By signing my name below, I, Arnette NorrisMari Johnson, attest that this documentation has been prepared under the direction and in the presence of Tilda BurrowFerguson, Craig Wisnewski V, MD Electronically Signed: Arnette NorrisMari Johnson Medical Scribe. 07/24/17. 9:35 AM.  I personally performed the services described in this documentation, which was SCRIBED in my presence. The recorded information has been reviewed and considered accurate. It has been edited as necessary during review. Tilda BurrowJohn V Kevonte Vanecek, MD

## 2017-08-11 ENCOUNTER — Other Ambulatory Visit: Payer: Self-pay | Admitting: Obstetrics and Gynecology

## 2017-08-11 DIAGNOSIS — Z363 Encounter for antenatal screening for malformations: Secondary | ICD-10-CM

## 2017-08-12 ENCOUNTER — Ambulatory Visit (INDEPENDENT_AMBULATORY_CARE_PROVIDER_SITE_OTHER): Payer: BLUE CROSS/BLUE SHIELD

## 2017-08-12 DIAGNOSIS — Z3402 Encounter for supervision of normal first pregnancy, second trimester: Secondary | ICD-10-CM

## 2017-08-12 DIAGNOSIS — Z363 Encounter for antenatal screening for malformations: Secondary | ICD-10-CM | POA: Diagnosis not present

## 2017-08-12 NOTE — Progress Notes (Signed)
US 21 wks,breech, cx 3.9 cm,posterior pl gr 0,normal ovaries bilat,fhr 154 bpm,svp of fluid 4.7 cm,EFW 398 g 49%,anatomy complete,no obvious abnormalities

## 2017-08-19 ENCOUNTER — Encounter: Payer: Self-pay | Admitting: Advanced Practice Midwife

## 2017-08-19 ENCOUNTER — Ambulatory Visit (INDEPENDENT_AMBULATORY_CARE_PROVIDER_SITE_OTHER): Payer: BLUE CROSS/BLUE SHIELD | Admitting: Advanced Practice Midwife

## 2017-08-19 VITALS — BP 105/65 | HR 95 | Wt 192.0 lb

## 2017-08-19 DIAGNOSIS — Z3482 Encounter for supervision of other normal pregnancy, second trimester: Secondary | ICD-10-CM

## 2017-08-19 DIAGNOSIS — Z331 Pregnant state, incidental: Secondary | ICD-10-CM

## 2017-08-19 DIAGNOSIS — Z3A22 22 weeks gestation of pregnancy: Secondary | ICD-10-CM

## 2017-08-19 DIAGNOSIS — Z1389 Encounter for screening for other disorder: Secondary | ICD-10-CM

## 2017-08-19 LAB — POCT URINALYSIS DIPSTICK
GLUCOSE UA: NEGATIVE
KETONES UA: NEGATIVE
Nitrite, UA: NEGATIVE
Protein, UA: POSITIVE — AB

## 2017-08-19 NOTE — Progress Notes (Signed)
  [id number] 5951w0d Estimated Date of Delivery: 12/23/17  Blood pressure 105/65, pulse 95, weight 192 lb (87.1 kg), last menstrual period 03/18/2017.   BP weight and urine results all reviewed and noted.  Please refer to the obstetrical flow sheet for the fundal height and fetal heart rate documentation:  Patient reports good fetal movement, denies any bleeding and no rupture of membranes symptoms or regular contractions. Patient is without complaints. All questions were answered.   Physical Assessment:   Vitals:   08/19/17 1344  BP: 105/65  Pulse: 95  Weight: 192 lb (87.1 kg)  Body mass index is 35.12 kg/m.        Physical Examination:   General appearance: Well appearing, and in no distress  Mental status: Alert, oriented to person, place, and time  Skin: Warm & dry  Cardiovascular: Normal heart rate noted  Respiratory: Normal respiratory effort, no distress  Abdomen: Soft, gravid, nontender  Pelvic: Cervical exam deferred         Extremities: Edema: Trace  Fetal Status:     Movement: Present    Results for orders placed or performed in visit on 08/19/17 (from the past 24 hour(s))  POCT urinalysis dipstick   Collection Time: 08/19/17  1:46 PM  Result Value Ref Range   Color, UA     Clarity, UA     Glucose, UA Negative Negative   Bilirubin, UA     Ketones, UA neg    Spec Grav, UA  1.010 - 1.025   Blood, UA trace    pH, UA  5.0 - 8.0   Protein, UA Positive (A) Negative   Urobilinogen, UA  0.2 or 1.0 E.U./dL   Nitrite, UA neg    Leukocytes, UA Trace (A) Negative   Appearance     Odor       Orders Placed This Encounter  Procedures  . POCT urinalysis dipstick    Plan:  Continued routine obstetrical care,    Return in about 1 month (around 09/21/2017) for PN2/LROB.

## 2017-08-19 NOTE — Patient Instructions (Signed)

## 2017-09-23 ENCOUNTER — Ambulatory Visit (INDEPENDENT_AMBULATORY_CARE_PROVIDER_SITE_OTHER): Payer: BLUE CROSS/BLUE SHIELD | Admitting: Advanced Practice Midwife

## 2017-09-23 ENCOUNTER — Other Ambulatory Visit: Payer: BLUE CROSS/BLUE SHIELD

## 2017-09-23 ENCOUNTER — Other Ambulatory Visit: Payer: Self-pay

## 2017-09-23 ENCOUNTER — Encounter: Payer: Self-pay | Admitting: Advanced Practice Midwife

## 2017-09-23 VITALS — BP 111/70 | HR 75 | Wt 195.0 lb

## 2017-09-23 DIAGNOSIS — Z3482 Encounter for supervision of other normal pregnancy, second trimester: Secondary | ICD-10-CM

## 2017-09-23 DIAGNOSIS — Z131 Encounter for screening for diabetes mellitus: Secondary | ICD-10-CM

## 2017-09-23 DIAGNOSIS — Z331 Pregnant state, incidental: Secondary | ICD-10-CM

## 2017-09-23 DIAGNOSIS — Z23 Encounter for immunization: Secondary | ICD-10-CM

## 2017-09-23 DIAGNOSIS — Z3A27 27 weeks gestation of pregnancy: Secondary | ICD-10-CM

## 2017-09-23 DIAGNOSIS — Z1389 Encounter for screening for other disorder: Secondary | ICD-10-CM

## 2017-09-23 LAB — POCT URINALYSIS DIPSTICK OB
Blood, UA: NEGATIVE
GLUCOSE, UA: NEGATIVE — AB
Ketones, UA: NEGATIVE
Leukocytes, UA: NEGATIVE
NITRITE UA: NEGATIVE
PROTEIN: NEGATIVE

## 2017-09-23 MED ORDER — PNV PRENATAL PLUS MULTIVITAMIN 27-1 MG PO TABS: 1.0000 | ORAL_TABLET | Freq: Every day | ORAL | 11 refills | Status: DC

## 2017-09-23 NOTE — Addendum Note (Signed)
Addended by: Jacklyn ShellRESENZO-DISHMON, Leatta Alewine on: 09/23/2017 11:34 AM   Modules accepted: Orders

## 2017-09-23 NOTE — Patient Instructions (Signed)
[name] [name], I greatly value your feedback.  If you receive a survey following your visit with us today, we appreciate you taking the time to fill it out.  Thanks, [name] [name], CNM   Call the office [phone]) or go to [facility]: [name] [name] if:  You begin to have strong, frequent contractions  Your water breaks.  Sometimes it is a big gush of fluid, sometimes it is just a trickle that keeps getting your panties wet or running down your legs  You have vaginal bleeding.  It is normal to have a small amount of spotting if your cervix was checked.   You don't feel your baby moving like normal.  If you don't, get you something to eat and drink and lay down and focus on feeling your baby move.  You should feel at least 10 movements in 2 hours.  If you don't, you should call the office or go to [name].    Tdap Vaccine  It is recommended that you get the Tdap vaccine during the third trimester of EACH pregnancy to help protect your baby from getting pertussis (whooping cough)  27-36 weeks is the BEST time to do this so that you can pass the protection on to your baby. During pregnancy is better than after pregnancy, but if you are unable to get it during pregnancy it will be offered at the [name].   You will be offered this vaccine in the office after 27 weeks. If you do not have health insurance, you can get this vaccine at the health department or your family doctor  Everyone who will be around your baby should also be up-to-date on their vaccines. Adults (who are not pregnant) only need 1 dose of Tdap during adulthood.   Third Trimester of Pregnancy The third trimester is from week 29 through week 42, months 7 through 9. The third trimester is a time when the fetus is growing rapidly. At the end of the ninth month, the fetus is about 20 inches in length and weighs 6-10 pounds.  BODY CHANGES Your body goes through many changes during pregnancy. The changes vary from woman to  woman.   Your weight will continue to increase. You can expect to gain 25-35 pounds (11-16 kg) by the end of the pregnancy.  You may begin to get stretch marks on your hips, abdomen, and breasts.  You may urinate more often because the fetus is moving lower into your pelvis and pressing on your bladder.  You may develop or continue to have heartburn as a result of your pregnancy.  You may develop constipation because certain hormones are causing the muscles that push waste through your intestines to slow down.  You may develop hemorrhoids or swollen, bulging veins (varicose veins).  You may have pelvic pain because of the weight gain and pregnancy hormones relaxing your joints between the bones in your pelvis. Backaches may result from overexertion of the muscles supporting your posture.  You may have changes in your hair. These can include thickening of your hair, rapid growth, and changes in texture. Some women also have hair loss during or after pregnancy, or hair that feels dry or thin. Your hair will most likely return to normal after your baby is born.  Your breasts will continue to grow and be tender. A yellow discharge may leak from your breasts called colostrum.  Your belly button may stick out.  You may feel short of breath because of your expanding uterus.  You  may notice the fetus "dropping," or moving lower in your abdomen.  You may have a bloody mucus discharge. This usually occurs a few days to a week before labor begins.  Your cervix becomes thin and soft (effaced) near your due date. WHAT TO EXPECT AT YOUR PRENATAL EXAMS  You will have prenatal exams every 2 weeks until week 36. Then, you will have weekly prenatal exams. During a routine prenatal visit:  You will be weighed to make sure you and the fetus are growing normally.  Your blood pressure is taken.  Your abdomen will be measured to track your baby's growth.  The fetal heartbeat will be listened  to.  Any test results from the previous visit will be discussed.  You may have a cervical check near your due date to see if you have effaced. At around 36 weeks, your caregiver will check your cervix. At the same time, your caregiver will also perform a test on the secretions of the vaginal tissue. This test is to determine if a type of bacteria, Group B streptococcus, is present. Your caregiver will explain this further. Your caregiver may ask you:  What your birth plan is.  How you are feeling.  If you are feeling the baby move.  If you have had any abnormal symptoms, such as leaking fluid, bleeding, severe headaches, or abdominal cramping.  If you have any questions. Other tests or screenings that may be performed during your third trimester include:  Blood tests that check for low iron levels (anemia).  Fetal testing to check the health, activity level, and growth of the fetus. Testing is done if you have certain medical conditions or if there are problems during the pregnancy. FALSE LABOR You may feel small, irregular contractions that eventually go away. These are called Braxton Hicks contractions, or false labor. Contractions may last for hours, days, or even weeks before true labor sets in. If contractions come at regular intervals, intensify, or become painful, it is best to be seen by your caregiver.  SIGNS OF LABOR   Menstrual-like cramps.  Contractions that are 5 minutes apart or less.  Contractions that start on the top of the uterus and spread down to the lower abdomen and back.  A sense of increased pelvic pressure or back pain.  A watery or bloody mucus discharge that comes from the vagina. If you have any of these signs before the 37th week of pregnancy, call your caregiver right away. You need to go to the [name] to get checked immediately. HOME CARE INSTRUCTIONS   Avoid all smoking, herbs, alcohol, and unprescribed drugs. These chemicals affect the  formation and growth of the baby.  Follow your caregiver's instructions regarding medicine use. There are medicines that are either safe or unsafe to take during pregnancy.  Exercise only as directed by your caregiver. Experiencing uterine cramps is a good sign to stop exercising.  Continue to eat regular, healthy meals.  Wear a good support bra for breast tenderness.  Do not use hot tubs, steam rooms, or saunas.  Wear your seat belt at all times when driving.  Avoid raw meat, uncooked cheese, cat litter boxes, and soil used by cats. These carry germs that can cause birth defects in the baby.  Take your prenatal vitamins.  Try taking a stool softener (if your caregiver approves) if you develop constipation. Eat more high-fiber foods, such as fresh vegetables or fruit and whole grains. Drink plenty of fluids to keep your urine clear  or pale yellow.  Take warm sitz baths to soothe any pain or discomfort caused by hemorrhoids. Use hemorrhoid cream if your caregiver approves.  If you develop varicose veins, wear support hose. Elevate your feet for 15 minutes, 3-4 times a day. Limit salt in your diet.  Avoid heavy lifting, wear low heal shoes, and practice good posture.  Rest a lot with your legs elevated if you have leg cramps or low back pain.  Visit your dentist if you have not gone during your pregnancy. Use a soft toothbrush to brush your teeth and be gentle when you floss.  A sexual relationship may be continued unless your caregiver directs you otherwise.  Do not travel far distances unless it is absolutely necessary and only with the approval of your caregiver.  Take prenatal classes to understand, practice, and ask questions about the labor and delivery.  Make a trial run to the [name].  Pack your [name] bag.  Prepare the baby's nursery.  Continue to go to all your prenatal visits as directed by your caregiver. SEEK MEDICAL CARE IF:  You are unsure if you are in  labor or if your water has broken.  You have dizziness.  You have mild pelvic cramps, pelvic pressure, or nagging pain in your abdominal area.  You have persistent nausea, vomiting, or diarrhea.  You have a bad smelling vaginal discharge.  You have pain with urination. SEEK IMMEDIATE MEDICAL CARE IF:   You have a fever.  You are leaking fluid from your vagina.  You have spotting or bleeding from your vagina.  You have severe abdominal cramping or pain.  You have rapid weight loss or gain.  You have shortness of breath with chest pain.  You notice sudden or extreme swelling of your face, hands, ankles, feet, or legs.  You have not felt your baby move in over an hour.  You have severe headaches that do not go away with medicine.  You have vision changes. Document Released: 01/28/2001 Document Revised: 02/08/2013 Document Reviewed: 04/06/2012 [name] Patient Information 2015 [place], [place]. This information is not intended to replace advice given to you by your health care provider. Make sure you discuss any questions you have with your health care provider.

## 2017-09-23 NOTE — Progress Notes (Signed)
LOW-RISK PREGNANCY VISIT Patient name: [name] [name] MRN [id number]  Date of birth: Oct 13, 1990 Chief Complaint:   Routine Prenatal Visit (PN2 today)  History of Present Illness:   [name] [name] is a 27 y.o. G46P1011 female at [redacted]w[redacted]d with an Estimated Date of Delivery: 12/23/17 being seen today for ongoing management of a low-risk pregnancy.  Today she reports no complaints.  . Vag. Bleeding: None.  Movement: Present. denies leaking of fluid. Review of Systems:   Pertinent items are noted in HPI Denies abnormal vaginal discharge w/ itching/odor/irritation, headaches, visual changes, shortness of breath, chest pain, abdominal pain, severe nausea/vomiting, or problems with urination or bowel movements unless otherwise stated above.  Pertinent History Reviewed:  Medical & Surgical Hx:   Past Medical History:  Diagnosis Date  . ADD (attention deficit disorder)   . ADHD (attention deficit hyperactivity disorder)   . Anxiety   . Bipolar disorder (HCC)   . Depression    manid depressive w/ generalized anxiety  . Frequent UTI   . PMDD (premenstrual dysphoric disorder)    Past Surgical History:  Procedure Laterality Date  . TONSILLECTOMY     Family History  Problem Relation Age of Onset  . Hypertension Mother   . Diabetes Mother   . Anxiety disorder Mother   . Asthma Mother   . Uterine cancer Mother 74       uterine or cervical  . Cancer Mother        ovarian  . Diabetes Father   . Hypertension Father   . Alcohol abuse Father   . Depression Father   . Arthritis Father   . Cancer Father        throat  . Arthritis Sister   . Endometriosis Sister   . Diabetes Sister   . Anxiety disorder Brother   . Hypertension Brother   . Arthritis Sister   . Arthritis Sister   . Asthma Sister   . Diabetes Paternal Uncle   . ADD / ADHD Son     Current Outpatient Medications:  .  escitalopram (LEXAPRO) 5 MG tablet, Take 1 tablet (5 mg total) by mouth daily., Disp: 30 tablet, Rfl: 5 .   lamoTRIgine (LAMICTAL) 100 MG tablet, Take 1 tablet (100 mg total) by mouth daily., Disp: 30 tablet, Rfl: 5 .  Prenatal Vit-Fe Fumarate-FA (PRENATAL VITAMIN PO), Take by mouth., Disp: , Rfl:  Social History: Reviewed -  reports that she has been smoking cigarettes.  She started smoking about 8 years ago. She has been smoking about 0.25 packs per day. She has never used smokeless tobacco.  Physical Assessment:   Vitals:   09/23/17 0905  BP: 111/70  Pulse: 75  Weight: 195 lb (88.5 kg)  Body mass index is 35.67 kg/m.        Physical Examination:   General appearance: Well appearing, and in no distress  Mental status: Alert, oriented to person, place, and time  Skin: Warm & dry  Cardiovascular: Normal heart rate noted  Respiratory: Normal respiratory effort, no distress  Abdomen: Soft, gravid, nontender  Pelvic: Cervical exam deferred         Extremities: Edema: Trace  Fetal Status:     Movement: Present    Results for orders placed or performed in visit on 09/23/17 (from the past 24 hour(s))  POC Urinalysis Dipstick OB   Collection Time: 09/23/17  9:06 AM  Result Value Ref Range   Color, UA     Clarity, UA  Glucose, UA Negative (A) (none)   Bilirubin, UA     Ketones, UA neg    Spec Grav, UA  1.010 - 1.025   Blood, UA neg    pH, UA  5.0 - 8.0   POC Protein UA Negative Negative, Trace   Urobilinogen, UA  0.2 or 1.0 E.U./dL   Nitrite, UA neg    Leukocytes, UA Negative Negative   Appearance     Odor      Assessment & Plan:  1) Low-risk pregnancy G3P1011 at 2510w0d with an Estimated Date of Delivery: 12/23/17   2) ,    Labs/procedures/US today: PN2  Plan:  Continue routine obstetrical care    Follow-up: Return in about 3 weeks (around 10/14/2017) for LROB.  Orders Placed This Encounter  Procedures  . Tdap vaccine greater than or equal to 7yo IM  . POC Urinalysis Dipstick OB   Jacklyn ShellFrances Cresenzo-Dishmon CNM 09/23/2017 9:38 AM

## 2017-09-24 LAB — CBC
Hematocrit: 32.3 % — ABNORMAL LOW (ref 34.0–46.6)
Hemoglobin: 10.5 g/dL — ABNORMAL LOW (ref 11.1–15.9)
MCH: 24 pg — AB (ref 26.6–33.0)
MCHC: 32.5 g/dL (ref 31.5–35.7)
MCV: 74 fL — ABNORMAL LOW (ref 79–97)
Platelets: 214 10*3/uL (ref 150–450)
RBC: 4.37 x10E6/uL (ref 3.77–5.28)
RDW: 16.4 % — ABNORMAL HIGH (ref 12.3–15.4)
WBC: 12.9 10*3/uL — ABNORMAL HIGH (ref 3.4–10.8)

## 2017-09-24 LAB — HIV ANTIBODY (ROUTINE TESTING W REFLEX): HIV Screen 4th Generation wRfx: NONREACTIVE

## 2017-09-24 LAB — RPR: RPR Ser Ql: NONREACTIVE

## 2017-09-24 LAB — ANTIBODY SCREEN: ANTIBODY SCREEN: NEGATIVE

## 2017-09-24 LAB — GLUCOSE TOLERANCE, 2 HOURS W/ 1HR
GLUCOSE, 1 HOUR: 173 mg/dL (ref 65–179)
GLUCOSE, FASTING: 87 mg/dL (ref 65–91)
Glucose, 2 hour: 132 mg/dL (ref 65–152)

## 2017-10-14 ENCOUNTER — Encounter: Payer: Self-pay | Admitting: *Deleted

## 2017-10-14 ENCOUNTER — Encounter: Payer: BLUE CROSS/BLUE SHIELD | Admitting: Advanced Practice Midwife

## 2017-11-18 ENCOUNTER — Encounter: Payer: BLUE CROSS/BLUE SHIELD | Admitting: Women's Health

## 2017-11-25 ENCOUNTER — Ambulatory Visit (INDEPENDENT_AMBULATORY_CARE_PROVIDER_SITE_OTHER): Payer: BLUE CROSS/BLUE SHIELD | Admitting: Obstetrics and Gynecology

## 2017-11-25 ENCOUNTER — Encounter: Payer: Self-pay | Admitting: Obstetrics and Gynecology

## 2017-11-25 ENCOUNTER — Other Ambulatory Visit: Payer: Self-pay

## 2017-11-25 VITALS — BP 115/64 | HR 94 | Wt 197.0 lb

## 2017-11-25 DIAGNOSIS — O360130 Maternal care for anti-D [Rh] antibodies, third trimester, not applicable or unspecified: Secondary | ICD-10-CM | POA: Diagnosis not present

## 2017-11-25 DIAGNOSIS — Z23 Encounter for immunization: Secondary | ICD-10-CM | POA: Diagnosis not present

## 2017-11-25 DIAGNOSIS — O26893 Other specified pregnancy related conditions, third trimester: Secondary | ICD-10-CM

## 2017-11-25 DIAGNOSIS — Z331 Pregnant state, incidental: Secondary | ICD-10-CM

## 2017-11-25 DIAGNOSIS — F319 Bipolar disorder, unspecified: Secondary | ICD-10-CM

## 2017-11-25 DIAGNOSIS — Z3483 Encounter for supervision of other normal pregnancy, third trimester: Secondary | ICD-10-CM

## 2017-11-25 DIAGNOSIS — Z3A36 36 weeks gestation of pregnancy: Secondary | ICD-10-CM | POA: Diagnosis not present

## 2017-11-25 DIAGNOSIS — Z1389 Encounter for screening for other disorder: Secondary | ICD-10-CM

## 2017-11-25 DIAGNOSIS — Z6791 Unspecified blood type, Rh negative: Secondary | ICD-10-CM

## 2017-11-25 DIAGNOSIS — O99343 Other mental disorders complicating pregnancy, third trimester: Secondary | ICD-10-CM

## 2017-11-25 LAB — POCT URINALYSIS DIPSTICK OB
Blood, UA: NEGATIVE
GLUCOSE, UA: NEGATIVE
Ketones, UA: NEGATIVE
Leukocytes, UA: NEGATIVE
Nitrite, UA: NEGATIVE
POC,PROTEIN,UA: NEGATIVE

## 2017-11-25 NOTE — Progress Notes (Signed)
Patient ID: [name] [name], female   DOB: 03-Jan-1991, 27 y.o.   MRN: [id number]    LOW-RISK PREGNANCY VISIT Patient name: [name] [name] MRN [id number]  Date of birth: January 10, 1991 Chief Complaint:   Routine Prenatal Visit  History of Present Illness:   [name] [name] is a 27 y.o. G68P1011 female at [redacted]w[redacted]d with an Estimated Date of Delivery: 12/23/17 being seen today for ongoing management of a low-risk pregnancy. Is still taking [name] [name] for bipolar disorder. Will breastfeed and plans to do [name] [name] [facility] pills after delivery. It did better at controlling her periods better than the Nexplanon. Today she reports no complaints. Contractions: Irregular. Vag. Bleeding: None.  Movement: Present. denies leaking of fluid. Review of Systems:   Pertinent items are noted in HPI Denies abnormal vaginal discharge w/ itching/odor/irritation, headaches, visual changes, shortness of breath, chest pain, abdominal pain, severe nausea/vomiting, or problems with urination or bowel movements unless otherwise stated above. Pertinent History Reviewed:  Reviewed past medical,surgical, social, obstetrical and family history.  Reviewed problem list, medications and allergies. Physical Assessment:   Vitals:   11/25/17 1409  BP: 115/64  Pulse: 94  Weight: 197 lb (89.4 kg)  Body mass index is 36.03 kg/m.        Physical Examination:   General appearance: Well appearing, and in no distress  Mental status: Alert, oriented to person, place, and time  Skin: Warm & dry  Cardiovascular: Normal heart rate noted  Respiratory: Normal respiratory effort, no distress  Abdomen: Soft, gravid, nontender  Pelvic: Cervical exam deferred         Extremities: Edema: Trace  Fetal Status: Fetal Heart Rate (bpm): 147 Fundal Height: 36 cm Movement: Present    Results for orders placed or performed in visit on 11/25/17 (from the past 24 hour(s))  POC Urinalysis Dipstick OB   Collection Time: 11/25/17  2:08 PM  Result Value Ref Range   Color,  UA     Clarity, UA     Glucose, UA Negative Negative   Bilirubin, UA     Ketones, UA neg    Spec Grav, UA     Blood, UA neg    pH, UA     POC Protein UA Negative Negative, Trace   Urobilinogen, UA     Nitrite, UA neg    Leukocytes, UA Negative Negative   Appearance     Odor      Assessment & Plan:  1) Low-risk pregnancy G3P1011 at [redacted]w[redacted]d with an Estimated Date of Delivery: 12/23/17     Meds: No orders of the defined types were placed in this encounter.  Labs/procedures today: Flu Vaccine 28+ mos IM, RHO (D) Immune Globulin  Plan:  Continue routine obstetrical care F/u in 1 week for GBS/GC   Follow-up: Return in about 1 week (around 12/02/2017) for GBS/GC, LROB.  Orders Placed This Encounter  Procedures  . RHO (D) Immune Globulin  . Flu Vaccine QUAD 36+ mos IM  . POC Urinalysis Dipstick OB   By signing my name below, I, Arnette Norris, attest that this documentation has been prepared under the direction and in the presence of Tilda Burrow, MD. Electronically Signed: Arnette Norris Medical Scribe. 11/25/17. 2:33 PM.  I personally performed the services described in this documentation, which was SCRIBED in my presence. The recorded information has been reviewed and considered accurate. It has been edited as necessary during review. Tilda Burrow, MD

## 2017-12-02 ENCOUNTER — Ambulatory Visit (INDEPENDENT_AMBULATORY_CARE_PROVIDER_SITE_OTHER): Payer: BLUE CROSS/BLUE SHIELD | Admitting: Obstetrics and Gynecology

## 2017-12-02 VITALS — BP 114/70 | HR 90 | Wt 197.6 lb

## 2017-12-02 DIAGNOSIS — K219 Gastro-esophageal reflux disease without esophagitis: Secondary | ICD-10-CM

## 2017-12-02 DIAGNOSIS — Z3A37 37 weeks gestation of pregnancy: Secondary | ICD-10-CM

## 2017-12-02 DIAGNOSIS — Z1389 Encounter for screening for other disorder: Secondary | ICD-10-CM

## 2017-12-02 DIAGNOSIS — O9989 Other specified diseases and conditions complicating pregnancy, childbirth and the puerperium: Secondary | ICD-10-CM

## 2017-12-02 DIAGNOSIS — Z3483 Encounter for supervision of other normal pregnancy, third trimester: Secondary | ICD-10-CM

## 2017-12-02 DIAGNOSIS — Z331 Pregnant state, incidental: Secondary | ICD-10-CM

## 2017-12-02 LAB — POCT URINALYSIS DIPSTICK OB
Glucose, UA: NEGATIVE
Ketones, UA: NEGATIVE
LEUKOCYTES UA: NEGATIVE
NITRITE UA: NEGATIVE
PROTEIN: NEGATIVE
RBC UA: NEGATIVE

## 2017-12-02 LAB — OB RESULTS CONSOLE GBS: GBS: NEGATIVE

## 2017-12-02 MED ORDER — OMEPRAZOLE 20 MG PO CPDR: 20.0000 mg | DELAYED_RELEASE_CAPSULE | Freq: Every day | ORAL | 3 refills | Status: DC

## 2017-12-02 NOTE — Progress Notes (Signed)
Patient ID: [name], female   DOB: 1990/08/06, 27 y.o.   MRN: [id number]    LOW-RISK PREGNANCY VISIT Patient name: [name] [name] MRN [id number]  Date of birth: [birth year] Chief Complaint:   Routine Prenatal Visit  History of Present Illness:   [name] [name] is a 28 y.o. G48P1011 female at [redacted]w[redacted]d with an Estimated Date of Delivery: 12/23/17 being seen today for ongoing management of a low-risk pregnancy. Has had acid reflux with nausea and vomiting. Has complaints of hemorrhoids Today she reports heartburn, nausea and vomiting. Contractions: Irritability. Vag. Bleeding: None.  Movement: Present. denies leaking of fluid. Review of Systems:   Pertinent items are noted in HPI Denies abnormal vaginal discharge w/ itching/odor/irritation, headaches, visual changes, shortness of breath, chest pain, abdominal pain, severe nausea/vomiting, or problems with urination or bowel movements unless otherwise stated above. Pertinent History Reviewed:  Reviewed past medical,surgical, social, obstetrical and family history.  Reviewed problem list, medications and allergies. Physical Assessment:   Vitals:   12/02/17 1424  BP: 114/70  Pulse: 90  Weight: 197 lb 9.6 oz (89.6 kg)  Body mass index is 36.14 kg/m.        Physical Examination:   General appearance: Well appearing, and in no distress  Mental status: Alert, oriented to person, place, and time  Skin: Warm & dry  Cardiovascular: Normal heart rate noted  Respiratory: Normal respiratory effort, no distress  Abdomen: Soft, gravid, nontender  Pelvic: Cervical exam performed, 1 cm long, vertex         Extremities: Edema: Trace  Fetal Status: Fetal Heart Rate (bpm): 154 Fundal Height: 36 cm Movement: Present    Results for orders placed or performed in visit on 12/02/17 (from the past 24 hour(s))  POC Urinalysis Dipstick OB   Collection Time: 12/02/17  2:23 PM  Result Value Ref Range   Color, UA     Clarity, UA     Glucose, UA Negative  Negative   Bilirubin, UA     Ketones, UA neg    Spec Grav, UA     Blood, UA neg    pH, UA     POC Protein UA Negative Negative, Trace   Urobilinogen, UA     Nitrite, UA neg    Leukocytes, UA Negative Negative   Appearance     Odor      Assessment & Plan:  1) Low-risk pregnancy G3P1011 at [redacted]w[redacted]d with an Estimated Date of Delivery: 12/23/17  2) GERD  rx omeprazole 20 q day.   Meds: No orders of the defined types were placed in this encounter.  Labs/procedures today: GBS, GC/CHL  Plan:   1. Rx omeprazole 2. Continue routine obstetrical care 3. F/u in 1 week for LROB  Reviewed: Term labor symptoms and general obstetric precautions including but not limited to vaginal bleeding, contractions, leaking of fluid and fetal movement were reviewed in detail with the patient.  All questions were answered  Follow-up: Return in about 1 week (around 12/09/2017) for LROB.  Orders Placed This Encounter  Procedures  . GC/Chlamydia Probe Amp([facility])  . Culture, beta strep (group b only)  . POC Urinalysis Dipstick OB   By signing my name below, I, Arnette Norris, attest that this documentation has been prepared under the direction and in the presence of Tilda Burrow, MD. Electronically Signed: Arnette Norris Medical Scribe. 12/02/17. 2:38 PM.  I personally performed the services described in this documentation, which was SCRIBED in my presence. The recorded information has been reviewed  and considered accurate. It has been edited as necessary during review. Jonnie Kind, MD

## 2017-12-03 LAB — GC/CHLAMYDIA PROBE AMP

## 2017-12-06 LAB — CULTURE, BETA STREP (GROUP B ONLY): STREP GP B CULTURE: NEGATIVE

## 2017-12-10 ENCOUNTER — Encounter: Payer: Self-pay | Admitting: Advanced Practice Midwife

## 2017-12-10 ENCOUNTER — Ambulatory Visit (INDEPENDENT_AMBULATORY_CARE_PROVIDER_SITE_OTHER): Payer: BLUE CROSS/BLUE SHIELD | Admitting: Advanced Practice Midwife

## 2017-12-10 VITALS — BP 106/60 | HR 75 | Wt 199.0 lb

## 2017-12-10 DIAGNOSIS — Z3483 Encounter for supervision of other normal pregnancy, third trimester: Secondary | ICD-10-CM

## 2017-12-10 DIAGNOSIS — Z3A38 38 weeks gestation of pregnancy: Secondary | ICD-10-CM

## 2017-12-10 NOTE — Progress Notes (Signed)
  [id number] [redacted]w[redacted]d Estimated Date of Delivery: 12/23/17  Blood pressure 106/60, pulse 75, weight 199 lb (90.3 kg), last menstrual period 03/18/2017.   BP weight and urine results all reviewed and noted.  Please refer to the obstetrical flow sheet for the fundal height and fetal heart rate documentation:  Patient reports good fetal movement, denies any bleeding and no rupture of membranes symptoms or regular contractions. Patient is without complaints. All questions were answered.   Physical Assessment:   Vitals:   12/10/17 1204  BP: 106/60  Pulse: 75  Weight: 199 lb (90.3 kg)  Body mass index is 36.4 kg/m.        Physical Examination:   General appearance: Well appearing, and in no distress  Mental status: Alert, oriented to person, place, and time  Skin: Warm & dry  Cardiovascular: Normal heart rate noted  Respiratory: Normal respiratory effort, no distress  Abdomen: Soft, gravid, nontender  Pelvic: Cervical exam performed  Dilation: Closed Effacement (%): Thick Station: -2  Extremities: Edema: Trace  Fetal Status:     Movement: Present Presentation: Vertex  No results found for this or any previous visit (from the past 24 hour(s)).   No orders of the defined types were placed in this encounter.   Plan:  Continued routine obstetrical care,   Return in about 1 week (around 12/17/2017) for LROB.

## 2017-12-10 NOTE — Patient Instructions (Addendum)
BENEFITS OF BREASTFEEDING Many women wonder if they should breastfeed. Research shows that breast milk contains the perfect balance of vitamins, protein and fat that your baby needs to grow. It also contains antibodies that help your baby's immune system to fight off viruses and bacteria and can reduce the risk of sudden infant death syndrome (SIDS). In addition, the colostrum (a fluid secreted from the breast in the first few days after delivery) helps your newborn's digestive system to grow and function well. Breast milk is easier to digest than formula. Also, if your baby is born preterm, breast milk can help to reduce both short- and long-term health problems. BENEFITS OF BREASTFEEDING FOR MOM . Breastfeeding causes a hormone to be released that helps the uterus to contract and return to its normal size more quickly. . It aids in postpartum weight loss, reduces risk of breast and ovarian cancer, heart disease and rheumatoid arthritis. . It decreases the amount of bleeding after the baby is born. benefits of breastfeeding for baby . Provides comfort and nutrition . Protects baby against - Obesity - Diabetes - Asthma - Childhood cancers - Heart disease - Ear infections - Diarrhea - Pneumonia - Stomach problems - Serious allergies - Skin rashes . Promotes growth and development . Reduces the risk of baby having Sudden Infant Death Syndrome (SIDS) only breastmilk for the first 6 months . Protects baby against diseases/allergies . It's the perfect amount for tiny bellies . It restores baby's energy . Provides the best nutrition for baby . Giving water or formula can make baby more likely to get sick, decrease Mom's milk supply, make baby less content with breastfeeding Skin to Skin After delivery, the staff will place your baby on your chest. This helps with the following: . Regulates baby's temperature, breathing, heart rate and blood sugar . Increases Mom's milk supply . Promotes  bonding . Keeps baby and Mom calm and decreases baby's crying Rooming In Your baby will stay in your room with you for the entire time you are in the hospital. This helps with the following: . Allows Mom to learn baby's feeding cues - Fluttering eyes - Sucking on tongue or hand - Rooting (opens mouth and turns head) - Nuzzling into the breast - Bringing hand to mouth . Allows breastfeeding on demand (when your baby is ready) . Helps baby to be calm and content . Ensures a good milk supply . Prevents complications with breastfeeding . Allows parents to learn to care for baby . Allows you to request assistance with breastfeeding Importance of a good latch . Increases milk transfer to baby - baby gets enough milk . Ensures you have enough milk for your baby . Decreases nipple soreness . Don't use pacifiers and bottles - these cause baby to suck differently than breastfeeding . Promotes continuation of breastfeeding Risks of Formula Supplementation with Breastfeeding Giving your infant formula in addition to your breast-milk EXCEPT when medically necessary can lead to: . Decreases your milk supply  . Loss of confidence in yourself for providing baby's nutrition  . Engorgement and possibly mastitis  . Asthma & allergies in the baby BREASTFEEDING FAQS How long should I breastfeed my baby? It is recommended that you provide your baby with breast milk only for the first 6 months and then continue for the first year and longer as desired. During the first few weeks after birth, your baby will need to feed 8-12 times every 24 hours, or every 2-3 hours. They will likely feed   for 15-30 minutes. How can I help my baby begin breastfeeding? Babies are born with an instinct to breastfeed. A healthy baby can begin breastfeeding right away without specific help. At the hospital, a nurse (or lactation consultant) will help you begin the process and will give you tips on good positioning. It may be  helpful to take a breastfeeding class before you deliver in order to know what to expect. How can I help my baby latch on? In order to assist your baby in latching-on, cup your breast in your hand and stroke your baby's lower lip with your nipple to stimulate your baby's rooting reflex. Your baby will look like he or she is yawning, at which point you should bring the baby towards your breast, while aiming the nipple at the roof of his or her mouth. Remember to bring the baby towards you and not your breast towards the baby. How can I tell if my baby is latched-on? Your baby will have all of your nipple and part of the dark area around the nipple in his or her mouth and your baby's nose will be touching your breast. You should see or hear the baby swallowing. If the baby is not latched-on properly, start the process over. To remove the suction, insert a clean finger between your breast and the baby's mouth. Should I switch breasts during feeding? After feeding on one side, switch the baby to your other breast. If he or she does not continue feeding - that is OK. Your baby will not necessarily need to feed from both breasts in a single feeding. On the next feeding, start with the other breast for efficiency and comfort. How can I tell if my baby is hungry? When your baby is hungry, they will nuzzle against your breast, make sucking noises and tongue motions and may put their hands near their mouth. Crying is a late sign of hunger, so you should not wait until this point. When they have received enough milk, they will unlatch from the breast. Is it okay to use a pacifier? Until your baby gets the hang of breastfeeding, experts recommend limiting pacifier usage. If you have questions about this, please contact your pediatrician. What can I do to ensure proper nutrition while breastfeeding? . Make sure that you support your own health and your baby's by eating a healthy, well-balanced diet . Your provider  may recommend that you continue to take your prenatal vitamin . Drink plenty of fluids. It is a good rule to drink one glass of water before or after feeding . Alcohol will remain in the breast milk for as long as it will remain in the blood stream. If you choose to have a drink, it is recommended that you wait at least 2 hours before feeding . Moderate amounts of caffeine are OK . Some over-the-counter or prescription medications are not recommended during breastfeeding. Check with your provider if you have questions What types of birth control methods are safe while breastfeeding? Progestin-only methods, including a daily pill, an IUD, the implant and the injection are safe while breastfeeding. Methods that contain estrogen (such as combination birth control pills, the vaginal ring and the patch) should not be used during the first month of breastfeeding as these can decrease your milk supply.    Cervical Ripening: May try one or both  Red Raspberry Leaf capsules:  two 300mg  or 400mg  tablets with each meal, 2-3 times a day  Potential Side Effects Of Raspberry Leaf:  Most women do not experience any side effects from drinking raspberry leaf tea. However, nausea and loose stools are possible     Evening Primrose Oil capsules: may take 1 to 3 capsules daily. May also prick one to release the oil and insert it into your vagina at night.  Some of the potential side effects:  Upset stomach  Loose stools or diarrhea  Headaches  Nausea:    

## 2017-12-14 ENCOUNTER — Telehealth: Payer: Self-pay | Admitting: Obstetrics & Gynecology

## 2017-12-14 ENCOUNTER — Encounter: Payer: Self-pay | Admitting: Advanced Practice Midwife

## 2017-12-14 NOTE — Telephone Encounter (Signed)
Patient states she is very uncomfortable with her hemorrhoids. She has tried OTC witch hazel pads, prepH and nothing is helping.  She is miserable and states she can't work and just needs to have this baby.  I informed the patient that she will be 39 weeks on Wednesday and can ask to have her membranes stripped at that visit.  Pt verbalized understanding.

## 2017-12-14 NOTE — Telephone Encounter (Signed)
Patient stated that she is two days shy of being 39 weeks.  She stated nothing is wrong, but she has hemorrhoids and she is a 1000 shades of miserable and not sleeping at all.  She wants to know if she can come in and something be done.  Told her we would let the nurse access and let her know.  [phone]

## 2017-12-16 ENCOUNTER — Encounter: Payer: Self-pay | Admitting: Women's Health

## 2017-12-16 ENCOUNTER — Ambulatory Visit (INDEPENDENT_AMBULATORY_CARE_PROVIDER_SITE_OTHER): Payer: BLUE CROSS/BLUE SHIELD | Admitting: Women's Health

## 2017-12-16 ENCOUNTER — Other Ambulatory Visit: Payer: Self-pay

## 2017-12-16 VITALS — BP 108/58 | HR 71 | Wt 198.0 lb

## 2017-12-16 DIAGNOSIS — Z3483 Encounter for supervision of other normal pregnancy, third trimester: Secondary | ICD-10-CM

## 2017-12-16 DIAGNOSIS — Z331 Pregnant state, incidental: Secondary | ICD-10-CM

## 2017-12-16 DIAGNOSIS — Z3A39 39 weeks gestation of pregnancy: Secondary | ICD-10-CM | POA: Diagnosis not present

## 2017-12-16 DIAGNOSIS — Z1389 Encounter for screening for other disorder: Secondary | ICD-10-CM

## 2017-12-16 LAB — POCT URINALYSIS DIPSTICK OB
Blood, UA: NEGATIVE
Glucose, UA: NEGATIVE
Ketones, UA: NEGATIVE
LEUKOCYTES UA: NEGATIVE
NITRITE UA: NEGATIVE
PROTEIN: NEGATIVE

## 2017-12-16 NOTE — Patient Instructions (Signed)
[name] [name], I greatly value your feedback.  If you receive a survey following your visit with [place] today, we appreciate you taking the time to fill it out.  Thanks, Joellyn Haff, CNM, WHNP-BC   Call the office [phone]) or go to [facility] if:  You begin to have strong, frequent contractions  Your water breaks.  Sometimes it is a big gush of fluid, sometimes it is just a trickle that keeps getting your panties wet or running down your legs  You have vaginal bleeding.  It is normal to have a small amount of spotting if your cervix was checked.   You don't feel your baby moving like normal.  If you don't, get you something to eat and drink and lay down and focus on feeling your baby move.  You should feel at least 10 movements in 2 hours.  If you don't, you should call the office or go to [facility].     [facility] Contractions Contractions of the uterus can occur throughout pregnancy, but they are not always a sign that you are in labor. You may have practice contractions called Braxton Hicks contractions. These false labor contractions are sometimes confused with true labor. What are Deberah Pelton contractions? Braxton Hicks contractions are tightening movements that occur in the muscles of the uterus before labor. Unlike true labor contractions, these contractions do not result in opening (dilation) and thinning of the cervix. Toward the end of pregnancy (32-34 weeks), Braxton Hicks contractions can happen more often and may become stronger. These contractions are sometimes difficult to tell apart from true labor because they can be very uncomfortable. You should not feel embarrassed if you go to the hospital with false labor. Sometimes, the only way to tell if you are in true labor is for your health care provider to look for changes in the cervix. The health care provider will do a physical exam and may monitor your contractions. If you are not in true labor, the exam should  show that your cervix is not dilating and your water has not broken. If there are other health problems associated with your pregnancy, it is completely safe for you to be sent home with false labor. You may continue to have Braxton Hicks contractions until you go into true labor. How to tell the difference between true labor and false labor True labor  Contractions last 30-70 seconds.  Contractions become very regular.  Discomfort is usually felt in the top of the uterus, and it spreads to the lower abdomen and low back.  Contractions do not go away with walking.  Contractions usually become more intense and increase in frequency.  The cervix dilates and gets thinner. False labor  Contractions are usually shorter and not as strong as true labor contractions.  Contractions are usually irregular.  Contractions are often felt in the front of the lower abdomen and in the groin.  Contractions may go away when you walk around or change positions while lying down.  Contractions get weaker and are shorter-lasting as time goes on.  The cervix usually does not dilate or become thin. Follow these instructions at home:  Take over-the-counter and prescription medicines only as told by your health care provider.  Keep up with your usual exercises and follow other instructions from your health care provider.  Eat and drink lightly if you think you are going into labor.  If Braxton Hicks contractions are making you uncomfortable: ? Change your position from lying down  or resting to walking, or change from walking to resting. ? Sit and rest in a tub of warm water. ? Drink enough fluid to keep your urine pale yellow. Dehydration may cause these contractions. ? Do slow and deep breathing several times an hour.  Keep all follow-up prenatal visits as told by your health care provider. This is important. Contact a health care provider if:  You have a fever.  You have continuous pain in  your abdomen. Get help right away if:  Your contractions become stronger, more regular, and closer together.  You have fluid leaking or gushing from your vagina.  You pass blood-tinged mucus (bloody show).  You have bleeding from your vagina.  You have low back pain that you never had before.  You feel your baby's head pushing down and causing pelvic pressure.  Your baby is not moving inside you as much as it used to. Summary  Contractions that occur before labor are called Braxton Hicks contractions, false labor, or practice contractions.  Braxton Hicks contractions are usually shorter, weaker, farther apart, and less regular than true labor contractions. True labor contractions usually become progressively stronger and regular and they become more frequent.  Manage discomfort from [facility] contractions by changing position, resting in a warm bath, drinking plenty of water, or practicing deep breathing. This information is not intended to replace advice given to you by your health care provider. Make sure you discuss any questions you have with your health care provider. Document Released: 06/19/2016 Document Revised: 06/19/2016 Document Reviewed: 06/19/2016 Elsevier Interactive Patient Education  2018 [employer].

## 2017-12-16 NOTE — Progress Notes (Signed)
   LOW-RISK PREGNANCY VISIT Patient name: [name] [name] MRN [id number]  Date of birth: Dec 08, 1990 Chief Complaint:   Routine Prenatal Visit  History of Present Illness:   [name] [name] is a 27 y.o. G45P1011 female at [redacted]w[redacted]d with an Estimated Date of Delivery: 12/23/17 being seen today for ongoing management of a low-risk pregnancy.  Today she reports lots of irregular uc's, wants membranes swept. Contractions: Irregular. Vag. Bleeding: None.  Movement: Present. denies leaking of fluid. Review of Systems:   Pertinent items are noted in HPI Denies abnormal vaginal discharge w/ itching/odor/irritation, headaches, visual changes, shortness of breath, chest pain, abdominal pain, severe nausea/vomiting, or problems with urination or bowel movements unless otherwise stated above. Pertinent History Reviewed:  Reviewed past medical,surgical, social, obstetrical and family history.  Reviewed problem list, medications and allergies. Physical Assessment:   Vitals:   12/16/17 1343  BP: (!) 108/58  Pulse: 71  Weight: 198 lb (89.8 kg)  Body mass index is 36.21 kg/m.        Physical Examination:   General appearance: Well appearing, and in no distress  Mental status: Alert, oriented to person, place, and time  Skin: Warm & dry  Cardiovascular: Normal heart rate noted  Respiratory: Normal respiratory effort, no distress  Abdomen: Soft, gravid, nontender  Pelvic: Cervical exam performed  Dilation: 1 Effacement (%): Thick Station: -2   Discussed risks/benefits of membrane sweeping, not likely to work w/ cx being thick, wants to try anyway so membranes swept  Extremities: Edema: Trace  Fetal Status: Fetal Heart Rate (bpm): 138 Fundal Height: 38 cm Movement: Present Presentation: Vertex  Results for orders placed or performed in visit on 12/16/17 (from the past 24 hour(s))  POC Urinalysis Dipstick OB   Collection Time: 12/16/17  1:44 PM  Result Value Ref Range   Color, UA     Clarity, UA     Glucose, UA Negative Negative   Bilirubin, UA     Ketones, UA neg    Spec Grav, UA     Blood, UA neg    pH, UA     POC Protein UA Negative Negative, Trace   Urobilinogen, UA     Nitrite, UA neg    Leukocytes, UA Negative Negative   Appearance     Odor      Assessment & Plan:  1) Low-risk pregnancy G3P1011 at [redacted]w[redacted]d with an Estimated Date of Delivery: 12/23/17    Meds: No orders of the defined types were placed in this encounter.  Labs/procedures today: sve  Plan:  Continue routine obstetrical care   Reviewed: Term labor symptoms and general obstetric precautions including but not limited to vaginal bleeding, contractions, leaking of fluid and fetal movement were reviewed in detail with the patient.  All questions were answered  Follow-up: Return in about 1 week (around 12/23/2017) for LROB.  Orders Placed This Encounter  Procedures  . POC Urinalysis Dipstick OB   Cheral Marker CNM, [facility] 12/16/2017 2:03 PM

## 2017-12-18 ENCOUNTER — Inpatient Hospital Stay (HOSPITAL_COMMUNITY): Payer: BLUE CROSS/BLUE SHIELD | Admitting: Anesthesiology

## 2017-12-18 ENCOUNTER — Encounter (HOSPITAL_COMMUNITY): Payer: Self-pay

## 2017-12-18 ENCOUNTER — Other Ambulatory Visit: Payer: Self-pay

## 2017-12-18 ENCOUNTER — Inpatient Hospital Stay (HOSPITAL_COMMUNITY)
Admission: AD | Admit: 2017-12-18 | Discharge: 2017-12-19 | DRG: 806 | Disposition: A | Discharge status: Home / Self Care | Payer: BLUE CROSS/BLUE SHIELD | Attending: Obstetrics & Gynecology | Admitting: Obstetrics & Gynecology

## 2017-12-18 DIAGNOSIS — Z3483 Encounter for supervision of other normal pregnancy, third trimester: Secondary | ICD-10-CM | POA: Diagnosis present

## 2017-12-18 DIAGNOSIS — O99334 Smoking (tobacco) complicating childbirth: Secondary | ICD-10-CM | POA: Diagnosis present

## 2017-12-18 DIAGNOSIS — Z3490 Encounter for supervision of normal pregnancy, unspecified, unspecified trimester: Secondary | ICD-10-CM

## 2017-12-18 DIAGNOSIS — F3181 Bipolar II disorder: Secondary | ICD-10-CM | POA: Diagnosis present

## 2017-12-18 DIAGNOSIS — O99344 Other mental disorders complicating childbirth: Secondary | ICD-10-CM | POA: Diagnosis present

## 2017-12-18 DIAGNOSIS — Z3A39 39 weeks gestation of pregnancy: Secondary | ICD-10-CM | POA: Diagnosis not present

## 2017-12-18 DIAGNOSIS — F17210 Nicotine dependence, cigarettes, uncomplicated: Secondary | ICD-10-CM | POA: Diagnosis present

## 2017-12-18 DIAGNOSIS — F900 Attention-deficit hyperactivity disorder, predominantly inattentive type: Secondary | ICD-10-CM | POA: Diagnosis present

## 2017-12-18 DIAGNOSIS — F39 Unspecified mood [affective] disorder: Secondary | ICD-10-CM | POA: Diagnosis present

## 2017-12-18 LAB — CBC
HEMATOCRIT: 35.4 % — AB (ref 36.0–46.0)
HEMOGLOBIN: 11.5 g/dL — AB (ref 12.0–15.0)
MCH: 24.3 pg — ABNORMAL LOW (ref 26.0–34.0)
MCHC: 32.5 g/dL (ref 30.0–36.0)
MCV: 74.8 fL — AB (ref 80.0–100.0)
Platelets: 187 10*3/uL (ref 150–400)
RBC: 4.73 MIL/uL (ref 3.87–5.11)
RDW: 16.1 % — AB (ref 11.5–15.5)
WBC: 18.9 10*3/uL — AB (ref 4.0–10.5)
nRBC: 0 % (ref 0.0–0.2)

## 2017-12-18 LAB — RPR: RPR: NONREACTIVE

## 2017-12-18 MED ORDER — DIBUCAINE 1 % RE OINT
1.0000 "application " | TOPICAL_OINTMENT | RECTAL | Status: DC | PRN
  Administered 2017-12-18: 1 via RECTAL
  Filled 2017-12-18: qty 28

## 2017-12-18 MED ORDER — FENTANYL 2.5 MCG/ML BUPIVACAINE 1/10 % EPIDURAL INFUSION (WH - ANES)
14.0000 mL/h | INTRAMUSCULAR | Status: DC | PRN
  Administered 2017-12-18: 14 mL/h via EPIDURAL
  Filled 2017-12-18: qty 100

## 2017-12-18 MED ORDER — PANTOPRAZOLE SODIUM 40 MG PO TBEC
40.0000 mg | DELAYED_RELEASE_TABLET | Freq: Every day | ORAL | Status: DC
  Filled 2017-12-18: qty 1

## 2017-12-18 MED ORDER — EPHEDRINE 5 MG/ML INJ
10.0000 mg | INTRAVENOUS | Status: DC | PRN
  Filled 2017-12-18: qty 2

## 2017-12-18 MED ORDER — LACTATED RINGERS IV SOLN
500.0000 mL | Freq: Once | INTRAVENOUS | Status: AC
  Administered 2017-12-18: 500 mL via INTRAVENOUS

## 2017-12-18 MED ORDER — TETANUS-DIPHTH-ACELL PERTUSSIS 5-2.5-18.5 LF-MCG/0.5 IM SUSP: 0.5000 mL | Freq: Once | INTRAMUSCULAR | Status: DC

## 2017-12-18 MED ORDER — LIDOCAINE HCL (PF) 1 % IJ SOLN
30.0000 mL | INTRAMUSCULAR | Status: DC | PRN
  Filled 2017-12-18: qty 30

## 2017-12-18 MED ORDER — WITCH HAZEL-GLYCERIN EX PADS
1.0000 "application " | MEDICATED_PAD | CUTANEOUS | Status: DC | PRN
  Administered 2017-12-18: 1 via TOPICAL

## 2017-12-18 MED ORDER — SOD CITRATE-CITRIC ACID 500-334 MG/5ML PO SOLN: 30.0000 mL | ORAL | Status: DC | PRN

## 2017-12-18 MED ORDER — BENZOCAINE-MENTHOL 20-0.5 % EX AERO: 1.0000 "application " | INHALATION_SPRAY | CUTANEOUS | Status: DC | PRN

## 2017-12-18 MED ORDER — ONDANSETRON HCL 4 MG/2ML IJ SOLN: 4.0000 mg | INTRAMUSCULAR | Status: DC | PRN

## 2017-12-18 MED ORDER — SIMETHICONE 80 MG PO CHEW: 80.0000 mg | CHEWABLE_TABLET | ORAL | Status: DC | PRN

## 2017-12-18 MED ORDER — MEASLES, MUMPS & RUBELLA VAC ~~LOC~~ INJ: 0.5000 mL | INJECTION | Freq: Once | SUBCUTANEOUS | Status: DC

## 2017-12-18 MED ORDER — SENNOSIDES-DOCUSATE SODIUM 8.6-50 MG PO TABS
2.0000 | ORAL_TABLET | ORAL | Status: DC
  Administered 2017-12-18: 2 via ORAL
  Filled 2017-12-18: qty 2

## 2017-12-18 MED ORDER — OXYTOCIN BOLUS FROM INFUSION
500.0000 mL | Freq: Once | INTRAVENOUS | Status: AC
  Administered 2017-12-18: 500 mL via INTRAVENOUS

## 2017-12-18 MED ORDER — LAMOTRIGINE 100 MG PO TABS
100.0000 mg | ORAL_TABLET | Freq: Every day | ORAL | Status: DC
  Administered 2017-12-19: 100 mg via ORAL
  Filled 2017-12-18 (×3): qty 1

## 2017-12-18 MED ORDER — IBUPROFEN 600 MG PO TABS
600.0000 mg | ORAL_TABLET | Freq: Four times a day (QID) | ORAL | Status: DC
  Administered 2017-12-18 – 2017-12-19 (×4): 600 mg via ORAL
  Filled 2017-12-18 (×4): qty 1

## 2017-12-18 MED ORDER — LACTATED RINGERS IV SOLN: 500.0000 mL | Freq: Once | INTRAVENOUS | Status: DC

## 2017-12-18 MED ORDER — OXYTOCIN 40 UNITS IN LACTATED RINGERS INFUSION - SIMPLE MED
2.5000 [IU]/h | INTRAVENOUS | Status: DC
  Administered 2017-12-18: 2.5 [IU]/h via INTRAVENOUS
  Filled 2017-12-18: qty 1000

## 2017-12-18 MED ORDER — DIPHENHYDRAMINE HCL 25 MG PO CAPS: 25.0000 mg | ORAL_CAPSULE | Freq: Four times a day (QID) | ORAL | Status: DC | PRN

## 2017-12-18 MED ORDER — HYDROXYZINE HCL 50 MG PO TABS
50.0000 mg | ORAL_TABLET | Freq: Four times a day (QID) | ORAL | Status: DC | PRN
  Filled 2017-12-18: qty 1

## 2017-12-18 MED ORDER — ONDANSETRON HCL 4 MG PO TABS: 4.0000 mg | ORAL_TABLET | ORAL | Status: DC | PRN

## 2017-12-18 MED ORDER — DIPHENHYDRAMINE HCL 50 MG/ML IJ SOLN: 12.5000 mg | INTRAMUSCULAR | Status: DC | PRN

## 2017-12-18 MED ORDER — PHENYLEPHRINE 40 MCG/ML (10ML) SYRINGE FOR IV PUSH (FOR BLOOD PRESSURE SUPPORT)
80.0000 ug | PREFILLED_SYRINGE | INTRAVENOUS | Status: DC | PRN
  Filled 2017-12-18: qty 5

## 2017-12-18 MED ORDER — LACTATED RINGERS IV SOLN
INTRAVENOUS | Status: DC
  Administered 2017-12-18 (×2): via INTRAVENOUS

## 2017-12-18 MED ORDER — OXYCODONE-ACETAMINOPHEN 5-325 MG PO TABS: 1.0000 | ORAL_TABLET | ORAL | Status: DC | PRN

## 2017-12-18 MED ORDER — PHENYLEPHRINE 40 MCG/ML (10ML) SYRINGE FOR IV PUSH (FOR BLOOD PRESSURE SUPPORT)
80.0000 ug | PREFILLED_SYRINGE | INTRAVENOUS | Status: DC | PRN
  Filled 2017-12-18: qty 10
  Filled 2017-12-18: qty 5

## 2017-12-18 MED ORDER — FENTANYL CITRATE (PF) 100 MCG/2ML IJ SOLN: 50.0000 ug | INTRAMUSCULAR | Status: DC | PRN

## 2017-12-18 MED ORDER — LACTATED RINGERS IV SOLN: 500.0000 mL | INTRAVENOUS | Status: DC | PRN

## 2017-12-18 MED ORDER — FLEET ENEMA 7-19 GM/118ML RE ENEM: 1.0000 | ENEMA | RECTAL | Status: DC | PRN

## 2017-12-18 MED ORDER — PRENATAL MULTIVITAMIN CH
1.0000 | ORAL_TABLET | Freq: Every day | ORAL | Status: DC
  Administered 2017-12-19: 1 via ORAL
  Filled 2017-12-18: qty 1

## 2017-12-18 MED ORDER — OXYCODONE-ACETAMINOPHEN 5-325 MG PO TABS: 2.0000 | ORAL_TABLET | ORAL | Status: DC | PRN

## 2017-12-18 MED ORDER — COCONUT OIL OIL: 1.0000 "application " | TOPICAL_OIL | Status: DC | PRN

## 2017-12-18 MED ORDER — ESCITALOPRAM OXALATE 5 MG PO TABS
5.0000 mg | ORAL_TABLET | Freq: Every day | ORAL | Status: DC
Start: 2017-12-18 — End: 2017-12-19
  Administered 2017-12-19: 5 mg via ORAL
  Filled 2017-12-18 (×3): qty 1

## 2017-12-18 MED ORDER — ACETAMINOPHEN 325 MG PO TABS: 650.0000 mg | ORAL_TABLET | ORAL | Status: DC | PRN

## 2017-12-18 MED ORDER — LIDOCAINE HCL (PF) 1 % IJ SOLN
INTRAMUSCULAR | Status: DC | PRN
  Administered 2017-12-18: 5 mL via EPIDURAL
  Administered 2017-12-18: 3 mL via EPIDURAL
  Administered 2017-12-18: 2 mL via EPIDURAL

## 2017-12-18 MED ORDER — ONDANSETRON HCL 4 MG/2ML IJ SOLN
4.0000 mg | Freq: Four times a day (QID) | INTRAMUSCULAR | Status: DC | PRN
  Administered 2017-12-18: 4 mg via INTRAVENOUS
  Filled 2017-12-18: qty 2

## 2017-12-18 NOTE — MAU Note (Signed)
Pt reports contractions since 11:30pm, now every 3-4 minutes. Denies LOF or vaginal bleeding. Reports good fetal movement. Cervix was 2cm on Wednesday

## 2017-12-18 NOTE — Plan of Care (Signed)

## 2017-12-18 NOTE — Progress Notes (Signed)
[facility]N [facility]: Labor Progress Note  Subjective: Doing well, epidural working well, can still tell when contractions occur. Family in room including partner Alycia Rossetti, mother and mother-in-law.   Objective: BP 119/61   Pulse (!) 54   Temp 99.5 F (37.5 C) (Axillary)   Resp 18   Ht 5\' 3"  (1.6 m)   Wt 90.7 kg   LMP 03/18/2017 Comment: consistent bleeding whole month of september  SpO2 97%   BMI 35.43 kg/m  Gen: well-appearing, lying on back Dilation: 9 Effacement (%): 90 Station: 0 Presentation: Vertex Exam by:: Barnicle RN  Assessment and Plan: 27 y.o. Z6X0960 [redacted]w[redacted]d here with spontaneous onset of labor.  Labor: Expectant management, nearly complete. Contractions varying frequency but every 1-4 minutes. SROM 0815. -- pain control: epidural in place -- PPH Risk: low  Fetal Well-Being: EFW 7-8lbs by Leopold's. Cephalic by prior checks.  -- Category I - continuous fetal monitoring  -- GBS negative   Laurel S. Earlene Plater, DO OB/GYN Fellow, Faculty Practice  10:18 AM

## 2017-12-18 NOTE — Anesthesia Postprocedure Evaluation (Signed)
Anesthesia Post Note  Patient: [name] [name]  Procedure(s) Performed: AN AD HOC LABOR EPIDURAL     Patient location during evaluation: Mother Baby Anesthesia Type: Epidural Level of consciousness: awake Pain management: satisfactory to patient Vital Signs Assessment: post-procedure vital signs reviewed and stable Respiratory status: spontaneous breathing Cardiovascular status: stable Anesthetic complications: no    Last Vitals:  Vitals:   12/18/17 1300 12/18/17 1420  BP: 114/62 109/76  Pulse: (!) 50 (!) 58  Resp: 18 18  Temp: 36.8 C 36.9 C  SpO2:      Last Pain:  Vitals:   12/18/17 1420  TempSrc: Axillary  PainSc:    Pain Goal: Patients Stated Pain Goal: 3 (12/18/17 0825)               Cephus Shelling

## 2017-12-18 NOTE — Lactation Note (Signed)
This note was copied from a baby's chart. Lactation Consultation Note  Patient Name: [name] [name] RUEAV'W Date: 12/18/2017 Reason for consult: Initial assessment;Term Breastfeeding consultation services and support information given.  Baby is 2 hours old and currently latched well to breast.  Reviewed basics and answered questions.  Instructed to feed with any feeding cue and call for assist prn.  Maternal Data Does the patient have breastfeeding experience prior to this delivery?: Yes  Feeding Feeding Type: Breast Fed  LATCH Score Latch: Repeated attempts needed to sustain latch, nipple held in mouth throughout feeding, stimulation needed to elicit sucking reflex.  Audible Swallowing: Spontaneous and intermittent  Type of Nipple: Everted at rest and after stimulation  Comfort (Breast/Nipple): Soft / non-tender  Hold (Positioning): Assistance needed to correctly position infant at breast and maintain latch.  LATCH Score: 8  Interventions Interventions: Breast feeding basics reviewed;Assisted with latch;Skin to skin;Adjust position  Lactation Tools Discussed/Used     Consult Status Consult Status: Follow-up Date: 12/19/17 Follow-up type: In-patient    Huston Foley 12/18/2017, 2:02 PM

## 2017-12-18 NOTE — H&P (Signed)
[name] [name] is a 27 y.o. female G3P1011 with IUP at [redacted]w[redacted]d presenting for contractions f. Pt states she has been having regular, every 3-5 minutes contractions, associated with none vaginal bleeding for 6 hours..  Membranes are intact, with active fetal movement.   [facility]  Prenatal History/Complications:  SVD at term ADD/Bipolar 2/not on meds (declined therapy referral)  Past Medical History: Past Medical History:  Diagnosis Date  . ADD (attention deficit disorder)   . ADHD (attention deficit hyperactivity disorder)   . Anxiety   . Bipolar disorder (HCC)   . Depression    manid depressive w/ generalized anxiety  . Frequent UTI   . PMDD (premenstrual dysphoric disorder)     Past Surgical History: Past Surgical History:  Procedure Laterality Date  . TONSILLECTOMY      Obstetrical History: OB History    Gravida  3   Para  1   Term  1   Preterm      AB  1   Living  1     SAB  1   TAB      Ectopic      Multiple      Live Births  1            Social History: Social History   Socioeconomic History  . Marital status: Single    Spouse name: Not on file  . Number of children: Not on file  . Years of education: Not on file  . Highest education level: Not on file  Occupational History  . Not on file  Social Needs  . Financial resource strain: Not on file  . Food insecurity:    Worry: Not on file    Inability: Not on file  . Transportation needs:    Medical: Not on file    Non-medical: Not on file  Tobacco Use  . Smoking status: Current Every Day Smoker    Packs/day: 0.25    Types: Cigarettes    Start date: 09/06/2009  . Smokeless tobacco: Never Used  . Tobacco comment: 2-3 cigs per day  Substance and Sexual Activity  . Alcohol use: Not Currently    Alcohol/week: 4.0 standard drinks    Types: 4 Glasses of wine per week  . Drug use: No  . Sexual activity: Yes    Birth control/protection: None  Lifestyle  . Physical  activity:    Days per week: Not on file    Minutes per session: Not on file  . Stress: Not on file  Relationships  . Social connections:    Talks on phone: Not on file    Gets together: Not on file    Attends religious service: Not on file    Active member of club or organization: Not on file    Attends meetings of clubs or organizations: Not on file    Relationship status: Not on file  Other Topics Concern  . Not on file  Social History Narrative  . Not on file    Family History: Family History  Problem Relation Age of Onset  . Hypertension Mother   . Diabetes Mother   . Anxiety disorder Mother   . Asthma Mother   . Uterine cancer Mother 9       uterine or cervical  . Cancer Mother        ovarian  . Diabetes Father   . Hypertension Father   . Alcohol abuse Father   . Depression Father   .  Arthritis Father   . Cancer Father        throat  . Arthritis Sister   . Endometriosis Sister   . Diabetes Sister   . Anxiety disorder Brother   . Hypertension Brother   . Arthritis Sister   . Arthritis Sister   . Asthma Sister   . Diabetes Paternal Uncle   . ADD / ADHD Son     Allergies: No Known Allergies  Medications Prior to Admission  Medication Sig Dispense Refill Last Dose  . escitalopram (LEXAPRO) 5 MG tablet Take 1 tablet (5 mg total) by mouth daily. 30 tablet 5 Past Week at Unknown time  . lamoTRIgine (LAMICTAL) 100 MG tablet Take 1 tablet (100 mg total) by mouth daily. 30 tablet 5 Past Week at Unknown time  . omeprazole (PRILOSEC) 20 MG capsule Take 1 capsule (20 mg total) by mouth daily. 30 capsule 3 Past Week at Unknown time  . Prenatal Vit-Fe Fumarate-FA (PNV PRENATAL PLUS MULTIVITAMIN) 27-1 MG TABS Take 1 tablet by mouth daily. 30 tablet 11 Past Week at Unknown time        Review of Systems   Constitutional: Negative for fever and chills Eyes: Negative for visual disturbances Respiratory: Negative for shortness of breath, dyspnea Cardiovascular:  Negative for chest pain or palpitations  Gastrointestinal: Negative for vomiting, diarrhea and constipation.  POSITIVE for abdominal pain (contractions) Genitourinary: Negative for dysuria and urgency Musculoskeletal: Negative for back pain, joint pain, myalgias  Neurological: Negative for dizziness and headaches      Blood pressure 118/80, pulse (!) 49, temperature 97.8 F (36.6 C), temperature source Oral, resp. rate 20, height 5\' 3"  (1.6 m), weight 90.7 kg, last menstrual period 03/18/2017, SpO2 98 %. General appearance: alert, cooperative and no distress Lungs: clear to auscultation bilaterally Heart: regular rate and rhythm Abdomen: soft, non-tender; bowel sounds normal Extremities: Homans sign is negative, no sign of DVT DTR's 2+ Presentation: cephalic Fetal monitoring  Baseline: 140 bpm, Variability: Good {> 6 bpm), Accelerations: Reactive and Decelerations: Absent Uterine activity  3-4 minutes Dilation: 5.5 Effacement (%): 90 Station: -2 Exam by:: Camelia Eng RN   Prenatal labs: ABO, Rh: --/--/O NEG (11/01 1610) Antibody: PENDING (11/01 0627) Rubella: Immune (03/29 0000) RPR: Non Reactive (08/07 0849)  HBsAg: Negative (03/29 0000)  HIV: Non Reactive (08/07 0849)    Prenatal Transfer Tool  Maternal Diabetes: No Genetic Screening: Normal Maternal Ultrasounds/Referrals: Normal Fetal Ultrasounds or other Referrals:  None Maternal Substance Abuse:  No Significant Maternal Medications:  None Significant Maternal Lab Results: Lab values include: Group B Strep negative     Results for orders placed or performed during the hospital encounter of 12/18/17 (from the past 24 hour(s))  CBC   Collection Time: 12/18/17  6:27 AM  Result Value Ref Range   WBC 18.9 (H) 4.0 - 10.5 K/uL   RBC 4.73 3.87 - 5.11 MIL/uL   Hemoglobin 11.5 (L) 12.0 - 15.0 g/dL   HCT 96.0 (L) 45.4 - 09.8 %   MCV 74.8 (L) 80.0 - 100.0 fL   MCH 24.3 (L) 26.0 - 34.0 pg   MCHC 32.5 30.0 - 36.0  g/dL   RDW 11.9 (H) 14.7 - 82.9 %   Platelets 187 150 - 400 K/uL   nRBC 0.0 0.0 - 0.2 %  Type and screen [facility]   Collection Time: 12/18/17  6:27 AM  Result Value Ref Range   ABO/RH(D) O NEG    Antibody Screen PENDING  Sample Expiration      12/21/2017 Performed at [facility], [address] [place], [place] [address]     Nursing Staff Provider  Office Location [facility]  Dating  LMP c/w 1st trimester U/S 6wk    Anatomy US   Initiated care at 13wk  LAB RESULTS   Language  English Pap 11/24/16 neg [facility]  Support Person  [name] GC/CT Initial:          37wks:-/-    Genetics NT/IT: never had 2nd IT       AFP:        NIPS:  Flu Vaccine  1/19 [place]/HgbE   TDaP vaccine  8/7 CF 05/21/17 -  Rhogam  10/9 SMA 05/21/17 -    Blood Type O/Negative/-- (03/29 0000)  Feeding Plan breast Antibody Negative (03/29 0000)  Contraception POPs Rubella Immune (03/29 0000)  Circumcision Yes at FT RPR Nonreactive (03/29 0000)  Pediatrician  Rana Snare HBsAg Negative (03/29 0000)  Prenatal Classes  HIV Non-reactive (03/29 0000)    A1C/GTT  26-28wks: 87/173/132  BTL Consent  GBS   neg  VBAC Consent  Waterbirth [ ]  Class [ ]  Consent [ ]  CNM visit    Assessment: Trevor Duty is a 27 y.o. G3P1011 with an IUP at [redacted]w[redacted]d presenting for labor  Plan: #Labor: expectant management #Pain:  Per request #FWB Cat 1  Jacklyn Shell 12/18/2017, 7:09 AM

## 2017-12-18 NOTE — Anesthesia Procedure Notes (Signed)
Epidural Patient location during procedure: OB Start time: 12/18/2017 6:55 AM End time: 12/18/2017 7:03 AM  Staffing Anesthesiologist: Marcene Duos, MD Performed: anesthesiologist   Preanesthetic Checklist Completed: patient identified, site marked, surgical consent, pre-op evaluation, timeout performed, IV checked, risks and benefits discussed and monitors and equipment checked  Epidural Patient position: sitting Prep: site prepped and draped and DuraPrep Patient monitoring: continuous pulse ox and blood pressure Approach: midline Location: L4-L5 Injection technique: LOR air  Needle:  Needle type: Tuohy  Needle gauge: 17 G Needle length: 9 cm and 9 Needle insertion depth: 8 cm Catheter type: closed end flexible Catheter size: 19 Gauge Catheter at skin depth: 15 (13-->15cm when laid in lat decub) cm Test dose: negative  Assessment Events: blood not aspirated, injection not painful, no injection resistance, negative IV test and no paresthesia

## 2017-12-18 NOTE — Anesthesia Pain Management Evaluation Note (Signed)
  CRNA Pain Management Visit Note  Patient: [name] [name], 27 y.o., female  "Hello I am a member of the anesthesia team at [facility]. We have an anesthesia team available at all times to provide care throughout the hospital, including epidural management and anesthesia for C-section. I don't know your plan for the delivery whether it a natural birth, water birth, IV sedation, nitrous supplementation, doula or epidural, but we want to meet your pain goals."   1.Was your pain managed to your expectations on prior hospitalizations?   Yes   2.What is your expectation for pain management during this hospitalization?     Epidural  3.How can we help you reach that goal? Support prn  Record the patient's initial score and the patient's pain goal.   Pain: 2  Pain Goal: 3 [facility] wants you to be able to say your pain was always managed very well.  [facility] 12/18/2017

## 2017-12-18 NOTE — Anesthesia Preprocedure Evaluation (Signed)
Anesthesia Evaluation  Patient identified by MRN, date of birth, ID band Patient awake    Reviewed: Allergy & Precautions, Patient's Chart, lab work & pertinent test results  Airway Mallampati: II  TM Distance: >3 FB     Dental   Pulmonary Current Smoker,    Pulmonary exam normal        Cardiovascular negative cardio ROS Normal cardiovascular exam     Neuro/Psych PSYCHIATRIC DISORDERS negative neurological ROS     GI/Hepatic Neg liver ROS, GERD  ,  Endo/Other  negative endocrine ROS  Renal/GU negative Renal ROS     Musculoskeletal   Abdominal   Peds  Hematology negative hematology ROS (+)   Anesthesia Other Findings   Reproductive/Obstetrics (+) Pregnancy                             Lab Results  Component Value Date   WBC 18.9 (H) 12/18/2017   HGB 11.5 (L) 12/18/2017   HCT 35.4 (L) 12/18/2017   MCV 74.8 (L) 12/18/2017   PLT 187 12/18/2017    Anesthesia Physical Anesthesia Plan  ASA: II  Anesthesia Plan: Epidural   Post-op Pain Management:    Induction:   PONV Risk Score and Plan: Treatment may vary due to age or medical condition  Airway Management Planned: Natural Airway  Additional Equipment:   Intra-op Plan:   Post-operative Plan:   Informed Consent: I have reviewed the patients History and Physical, chart, labs and discussed the procedure including the risks, benefits and alternatives for the proposed anesthesia with the patient or authorized representative who has indicated his/her understanding and acceptance.     Plan Discussed with:   Anesthesia Plan Comments:         Anesthesia Quick Evaluation

## 2017-12-19 MED ORDER — RHO D IMMUNE GLOBULIN 1500 UNIT/2ML IJ SOSY
300.0000 ug | PREFILLED_SYRINGE | Freq: Once | INTRAMUSCULAR | Status: AC
  Administered 2017-12-19: 300 ug via INTRAMUSCULAR
  Filled 2017-12-19: qty 2

## 2017-12-19 MED ORDER — NORETHINDRONE 0.35 MG PO TABS: 1.0000 | ORAL_TABLET | Freq: Every day | ORAL | 11 refills | Status: DC

## 2017-12-19 MED ORDER — IBUPROFEN 600 MG PO TABS: 600.0000 mg | ORAL_TABLET | Freq: Four times a day (QID) | ORAL | 1 refills | Status: DC

## 2017-12-19 MED ORDER — ACETAMINOPHEN 325 MG PO TABS: 650.0000 mg | ORAL_TABLET | ORAL | 1 refills | Status: DC | PRN

## 2017-12-19 NOTE — Discharge Summary (Addendum)
Obstetrics Discharge Summary OB/GYN Faculty [facility]   Patient Name: [name] [name] DOB: Feb 14, 1991 MRN: [id number]  Date of admission: 12/18/2017 Delivering MD: Tamera Stands   Date of discharge: 12/19/2017  Admitting diagnosis: 39.1WKS CTX Intrauterine pregnancy: [redacted]w[redacted]d     Secondary diagnosis:   Principal Problem:   Normal labor Active Problems:   Affective disorder (HCC)   Bipolar 2 disorder (HCC)   Attention deficit hyperactivity disorder (ADHD), predominantly inattentive type   Supervision of normal pregnancy   Additional problems:  . Bipolar disorder on Lamictal and Lexapro     Discharge diagnosis: Term Pregnancy Delivered                                            Postpartum procedures: None  Complications: None  Hospital course: [name] is a 27 y.o. [redacted]w[redacted]d who was admitted for labor. [name] [name] pregnancy was complicated by mood disorder, treated with Lexapro and Lamictal. [name] [name] labor course and delivery were uncomplicated.  Please see delivery note for additional details. [name] [name] postpartum course was uncomplicated. She was breastfeeding without difficulty. By day of discharge, she was passing flatus, urinating, eating and drinking without difficulty. [name] [name] pain was well-controlled, and she was discharged home. She will follow-up in clinic in 4-6 weeks.   Physical exam  Vitals:   12/18/17 1420 12/18/17 1843 12/18/17 2129 12/19/17 0604  BP: 109/76 114/72 113/65 103/62  Pulse: (!) 58 64 (!) 56 62  Resp: 18 18 18 18   Temp: 98.4 F (36.9 C) 98.3 F (36.8 C) 98.5 F (36.9 C) 98 F (36.7 C)  TempSrc: Axillary Axillary Axillary Axillary  SpO2:      Weight:      Height:       General: Well-appearing, NAD Lochia: appropriate Uterine Fundus: firm Incision: N/A DVT Evaluation: No evidence of DVT seen on physical exam. Labs: Lab Results  Component Value Date   WBC 18.9 (H) 12/18/2017   HGB 11.5 (L) 12/18/2017   HCT 35.4 (L) 12/18/2017   MCV 74.8 (L) 12/18/2017   PLT  187 12/18/2017   CMP Latest Ref Rng & Units 02/26/2017  Glucose 65 - 99 mg/dL 96  BUN 6 - 20 mg/dL 8  Creatinine 4.09 - 8.11 mg/dL 9.14  Sodium 782 - 956 mmol/L 140  Potassium 3.5 - 5.2 mmol/L 4.0  Chloride 96 - 106 mmol/L 105  CO2 20 - 29 mmol/L 22  Calcium 8.7 - 10.2 mg/dL 9.4  Total Protein 6.0 - 8.5 g/dL 7.2  Total Bilirubin 0.0 - 1.2 mg/dL 0.2  Alkaline Phos 39 - 117 IU/L 100  AST 0 - 40 IU/L 15  ALT 0 - 32 IU/L 11    Discharge instructions: Per After Visit Summary and "Baby and Me Booklet"  After visit meds:  Allergies as of 12/19/2017   No Known Allergies     Medication List    TAKE these medications   acetaminophen 325 MG tablet Commonly known as:  TYLENOL Take 2 tablets (650 mg total) by mouth every 4 (four) hours as needed (for pain scale < 4).   escitalopram 5 MG tablet Commonly known as:  LEXAPRO Take 1 tablet (5 mg total) by mouth daily.   ibuprofen 600 MG tablet Commonly known as:  ADVIL,MOTRIN Take 1 tablet (600 mg total) by mouth every 6 (six) hours.   lamoTRIgine 100 MG tablet Commonly known as:  LAMICTAL  Take 1 tablet (100 mg total) by mouth daily.   norethindrone 0.35 MG tablet Commonly known as:  MICRONOR,CAMILA,ERRIN Take 1 tablet (0.35 mg total) by mouth daily. Start taking on:  01/10/2018   omeprazole 20 MG capsule Commonly known as:  PRILOSEC Take 1 capsule (20 mg total) by mouth daily.   PNV PRENATAL PLUS MULTIVITAMIN 27-1 MG Tabs Take 1 tablet by mouth daily.       Postpartum contraception: Progesterone only pills Diet: Routine Diet Activity: Advance as tolerated. Pelvic rest for 6 weeks.   Outpatient follow up:4-6wk Follow-up Appt: Future Appointments  Date Time Provider Department Center  12/24/2017  1:30 PM Cresenzo-Dishmon, Scarlette Calico, CNM [facility]   Follow-up Visit:No follow-ups on file.  Newborn Data: Live born female  Birth Weight: 7 lb 6.9 oz (3370 g) APGAR: 8, 9  Newborn Delivery   Birth date/time:   12/18/2017 11:29:00 Delivery type:  Vaginal, Spontaneous     Baby Feeding: Breast Disposition:home with mother  [name], MD  CNM attestation I have seen and examined this patient and agree with above documentation in the resident's note.   [name] is a 27 y.o. [id number] s/p SVD.   Pain is well controlled.  Plan for birth control is oral progesterone-only contraceptive.  Method of Feeding: breast  PE:  BP 103/62   Pulse 62   Temp 98 F (36.7 C) (Axillary)   Resp 18   Ht 5\' 3"  (1.6 m)   Wt 90.7 kg   LMP 03/18/2017 Comment: consistent bleeding whole month of september  SpO2 97%   Breastfeeding? Unknown   BMI 35.43 kg/m  Fundus firm  Recent Labs    12/18/17 0627  HGB 11.5*  HCT 35.4*     Plan: discharge today - postpartum care discussed - f/u clinic in 4 weeks for postpartum visit   [name], CNM 9:44 AM  12/19/2017

## 2017-12-19 NOTE — Lactation Note (Signed)
This note was copied from a baby's chart. Lactation Consultation Note  Patient Name: [name] [name] LOVFI'E Date: 12/19/2017 Reason for consult: Follow-up assessment;Term  Visited with P2 Mom of term baby on day of possible discharge.  Baby at 3.6% weight loss at 24 hrs old.    Mom on the phone, and baby latched to right breast.  Mom has large, heavy breasts, nipples erect and areola compressible.  Baby not elevated on pillow, and Mom leaning into baby.  Mom history of bipolar disorder and ADHD, and on Lexipro and Lamictal, both category L2 in [facility] 2019.  Baby to be monitored for feeding difficulty and irritability.   Reviewed importance of supporting breast, and controlling baby's latch to breast by using cross cradle hold, and football holds.  Baby swaddled initially, and Mom pushing her breast into baby's mouth when he opens widely.   Assisted with football hold and cross cradle hold.  Pillow placed under baby.  Mom seemed receptive to teaching, but appears highly anxious, and baby fussy.  Baby opens widely, and when brought onto breast quickly using good head support and breast support, baby able to attain a deep latch.  But Mom often drops her breast, and tends to pull breast out from near baby's nose.  Teaching on pulling baby's body in closer, and chin in closer done.  Mom receptive to this.  Hand pump given and demonstrated use.  Mom's attention very short.  She started telling LC "how it's gonna be" once she goes back to work, how her milk will dry up.  Mom very pleasant, but unsure if baby is fussy due to Mom's medication, or Mom's apparent anxiety.  Recommended frequent burping, and keeping baby on her chest STS as much as she can.  Goal of 8-12 feedings per 24 hrs reviewed.  Engorgement prevention and treatment reviewed. Mom aware of OP lactation support available to her.  Encouraged her to call for any concerns.  Interventions Interventions:  Breast feeding basics reviewed;Assisted with latch;Skin to skin;Breast massage;Hand express;Pre-pump if needed;Expressed milk;Position options;Support pillows;Adjust position;Breast compression;Hand pump  Lactation Tools Discussed/Used WIC Program: Yes Pump Review: Setup, frequency, and cleaning Initiated by:: Erby Pian RN IBCLC Date initiated:: 12/19/17   Consult Status Consult Status: Complete Date: 12/19/17 Follow-up type: Call as needed    Judee Clara 12/19/2017, 12:23 PM

## 2017-12-19 NOTE — Progress Notes (Addendum)
2PM: CSW went back to MOB's room to further discuss car seat options- MOB informed CSW [employer] had provided car seat at this time. Car seat was present in room. MOB voiced being ready to leave the hospital at this time. CSW updated RN on MOB having car seat.   CSW met with MOB via bedside after receiving MOB was requesting assistance with car seat. MOB was working with Data processing manager when CSW arrived- MOB seemed anxious and irritable while CSW was in room. After lactation left, CSW questioned if MOB/ supports (sister Nira Conn was present in room) would be to purchase car seat- MOB stated multiple times how she is poor and unable to afford a car seat. MOB stated she brought her pay stubs to "prove she is poor". Sister Nira Conn also stated she lives out of town and is unable to provide assistance with getting a car seat. CSW informed MOB of $30 needed for car seat- MOB informed CSW that FOB would be at the hospital shortyl and they would discuss if they have $30 at that time. CSW spoke with [facility], Arby Barrette, who is assisting with car seat as well.   CSW briefly spoke to [facility] about how she is currently feeling. MOB voiced feeling anxious but "not as bad" as when she was last pregnant. MOB is currently on Lexipro and Lamictal and states she feels good taking these medications. CSW attempted to discuss baby blues vs PMAD's however MOB continued to be distracted with breast feeding and difficult to redirect.   CSW will follow up with MOB regarding car seat.   Kingsley Spittle, [place]  [phone]

## 2017-12-20 LAB — RH IG WORKUP (INCLUDES ABO/RH)
ABO/RH(D): O NEG
Fetal Screen: NEGATIVE
GESTATIONAL AGE(WKS): 39.2
Unit division: 0

## 2017-12-21 ENCOUNTER — Telehealth: Payer: Self-pay | Admitting: *Deleted

## 2017-12-21 NOTE — Telephone Encounter (Signed)
Left message with man for pt to call us back.  12-21-17  AS

## 2017-12-22 LAB — BPAM RBC
BLOOD PRODUCT EXPIRATION DATE: 201911212359
Blood Product Expiration Date: 201911152359
UNIT TYPE AND RH: 9500
UNIT TYPE AND RH: 9500

## 2017-12-22 LAB — TYPE AND SCREEN
ABO/RH(D): O NEG
Antibody Screen: POSITIVE
UNIT DIVISION: 0
UNIT DIVISION: 0

## 2017-12-24 ENCOUNTER — Encounter: Payer: BLUE CROSS/BLUE SHIELD | Admitting: Advanced Practice Midwife

## 2018-01-27 ENCOUNTER — Ambulatory Visit (INDEPENDENT_AMBULATORY_CARE_PROVIDER_SITE_OTHER): Payer: BLUE CROSS/BLUE SHIELD | Admitting: Advanced Practice Midwife

## 2018-01-27 ENCOUNTER — Encounter: Payer: Self-pay | Admitting: Advanced Practice Midwife

## 2018-01-27 MED ORDER — PNV PRENATAL PLUS MULTIVITAMIN 27-1 MG PO TABS: 1.0000 | ORAL_TABLET | Freq: Every day | ORAL | 11 refills | Status: DC

## 2018-01-27 NOTE — Addendum Note (Signed)
Addended by: Jacklyn ShellRESENZO-DISHMON, Ysabel Cowgill on: 01/27/2018 02:45 PM   Modules accepted: Orders

## 2018-01-27 NOTE — Progress Notes (Signed)
[name] [name] is a 27 y.o. who presents for a postpartum visit. She is 6 weeks postpartum following a spontaneous vaginal delivery. I have fully reviewed the prenatal and intrapartum course. The delivery was at 39.2 gestational weeks.  Anesthesia: epidural. Postpartum course has been uneventful. Baby's course has been uneventful. Baby is feeding by breast. Bleeding: no bleeding. Bowel function is normal. Bladder function is normal. Patient is sexually active. Contraception method is oral progesterone-only contraceptive. Postpartum depression screening: negative. Feels like she is doing very well    Current Outpatient Medications:  .  escitalopram (LEXAPRO) 5 MG tablet, Take 1 tablet (5 mg total) by mouth daily., Disp: 30 tablet, Rfl: 5 .  lamoTRIgine (LAMICTAL) 100 MG tablet, Take 1 tablet (100 mg total) by mouth daily., Disp: 30 tablet, Rfl: 5 .  norethindrone (ORTHO MICRONOR) 0.35 MG tablet, Take 1 tablet (0.35 mg total) by mouth daily., Disp: 1 Package, Rfl: 11 .  omeprazole (PRILOSEC) 20 MG capsule, Take 1 capsule (20 mg total) by mouth daily., Disp: 30 capsule, Rfl: 3 .  Prenatal Vit-Fe Fumarate-FA (PNV PRENATAL PLUS MULTIVITAMIN) 27-1 MG TABS, Take 1 tablet by mouth daily., Disp: 30 tablet, Rfl: 11 .  acetaminophen (TYLENOL) 325 MG tablet, Take 2 tablets (650 mg total) by mouth every 4 (four) hours as needed (for pain scale < 4). (Patient not taking: Reported on 01/27/2018), Disp: 60 tablet, Rfl: 1 .  ibuprofen (ADVIL,MOTRIN) 600 MG tablet, Take 1 tablet (600 mg total) by mouth every 6 (six) hours. (Patient not taking: Reported on 01/27/2018), Disp: 60 tablet, Rfl: 1  Review of Systems   Constitutional: Negative for fever and chills Eyes: Negative for visual disturbances Respiratory: Negative for shortness of breath, dyspnea Cardiovascular: Negative for chest pain or palpitations  Gastrointestinal: Negative for vomiting, diarrhea and constipation Genitourinary: Negative for dysuria and  urgency Musculoskeletal: Negative for back pain, joint pain, myalgias  Neurological: Negative for dizziness and headaches    Objective:     Vitals:   01/27/18 1409  BP: 96/64  Pulse: 71   General:  alert, cooperative and no distress   Breasts:  negative  Lungs: Normal respiratory effort  Heart:  regular rate and rhythm  Abdomen: Soft, nontender   Vulva:  normal  Vagina: normal vagina  Cervix:  closed  Corpus: Well involuted     Rectal Exam: no hemorrhoids        Assessment:    normal postpartum exam.  Plan:   1. Contraception: oral progesterone-only contraceptive 2. Follow up in:   or as needed.

## 2018-03-17 ENCOUNTER — Ambulatory Visit: Payer: BLUE CROSS/BLUE SHIELD | Admitting: Physician Assistant

## 2018-03-17 ENCOUNTER — Ambulatory Visit: Payer: Self-pay | Admitting: Physician Assistant

## 2018-03-22 ENCOUNTER — Ambulatory Visit: Payer: Self-pay | Admitting: Physician Assistant

## 2018-03-22 NOTE — Progress Notes (Deleted)
       Patient: [name] [name] Female    DOB: 23-May-1990   27 y.o.   MRN: [id number] Visit Date: 03/22/2018  Today's Provider: Margaretann Loveless, PA-C   No chief complaint on file.  Subjective:     HPI  No Known Allergies   Current Outpatient Medications:  .  acetaminophen (TYLENOL) 325 MG tablet, Take 2 tablets (650 mg total) by mouth every 4 (four) hours as needed (for pain scale < 4). (Patient not taking: Reported on 01/27/2018), Disp: 60 tablet, Rfl: 1 .  escitalopram (LEXAPRO) 5 MG tablet, Take 1 tablet (5 mg total) by mouth daily., Disp: 30 tablet, Rfl: 5 .  ibuprofen (ADVIL,MOTRIN) 600 MG tablet, Take 1 tablet (600 mg total) by mouth every 6 (six) hours. (Patient not taking: Reported on 01/27/2018), Disp: 60 tablet, Rfl: 1 .  lamoTRIgine (LAMICTAL) 100 MG tablet, Take 1 tablet (100 mg total) by mouth daily., Disp: 30 tablet, Rfl: 5 .  norethindrone (ORTHO MICRONOR) 0.35 MG tablet, Take 1 tablet (0.35 mg total) by mouth daily., Disp: 1 Package, Rfl: 11 .  omeprazole (PRILOSEC) 20 MG capsule, Take 1 capsule (20 mg total) by mouth daily., Disp: 30 capsule, Rfl: 3 .  Prenatal Vit-Fe Fumarate-FA (PNV PRENATAL PLUS MULTIVITAMIN) 27-1 MG TABS, Take 1 tablet by mouth daily., Disp: 30 tablet, Rfl: 11  Review of Systems  Social History   Tobacco Use  . Smoking status: Current Every Day Smoker    Packs/day: 0.25    Types: Cigarettes    Start date: 09/06/2009  . Smokeless tobacco: Never Used  . Tobacco comment: 2-3 cigs per day  Substance Use Topics  . Alcohol use: Not Currently    Alcohol/week: 4.0 standard drinks    Types: 4 Glasses of wine per week      Objective:   There were no vitals taken for this visit. There were no vitals filed for this visit.   Physical Exam      Assessment & Plan        Margaretann Loveless, PA-C  [facility]

## 2018-03-23 NOTE — Progress Notes (Deleted)
       Patient: [name] [name] Female    DOB: 13-Oct-1990   28 y.o.   MRN: [id number] Visit Date: 03/23/2018  Today's Provider: Margaretann Loveless, PA-C   No chief complaint on file.  Subjective:     HPI  No Known Allergies   Current Outpatient Medications:  .  acetaminophen (TYLENOL) 325 MG tablet, Take 2 tablets (650 mg total) by mouth every 4 (four) hours as needed (for pain scale < 4). (Patient not taking: Reported on 01/27/2018), Disp: 60 tablet, Rfl: 1 .  escitalopram (LEXAPRO) 5 MG tablet, Take 1 tablet (5 mg total) by mouth daily., Disp: 30 tablet, Rfl: 5 .  ibuprofen (ADVIL,MOTRIN) 600 MG tablet, Take 1 tablet (600 mg total) by mouth every 6 (six) hours. (Patient not taking: Reported on 01/27/2018), Disp: 60 tablet, Rfl: 1 .  lamoTRIgine (LAMICTAL) 100 MG tablet, Take 1 tablet (100 mg total) by mouth daily., Disp: 30 tablet, Rfl: 5 .  norethindrone (ORTHO MICRONOR) 0.35 MG tablet, Take 1 tablet (0.35 mg total) by mouth daily., Disp: 1 Package, Rfl: 11 .  omeprazole (PRILOSEC) 20 MG capsule, Take 1 capsule (20 mg total) by mouth daily., Disp: 30 capsule, Rfl: 3 .  Prenatal Vit-Fe Fumarate-FA (PNV PRENATAL PLUS MULTIVITAMIN) 27-1 MG TABS, Take 1 tablet by mouth daily., Disp: 30 tablet, Rfl: 11  Review of Systems  Social History   Tobacco Use  . Smoking status: Current Every Day Smoker    Packs/day: 0.25    Types: Cigarettes    Start date: 09/06/2009  . Smokeless tobacco: Never Used  . Tobacco comment: 2-3 cigs per day  Substance Use Topics  . Alcohol use: Not Currently    Alcohol/week: 4.0 standard drinks    Types: 4 Glasses of wine per week      Objective:   There were no vitals taken for this visit. There were no vitals filed for this visit.   Physical Exam      Assessment & Plan        Margaretann Loveless, PA-C  [facility]

## 2018-03-24 ENCOUNTER — Ambulatory Visit: Payer: Self-pay | Admitting: Physician Assistant

## 2018-04-30 ENCOUNTER — Other Ambulatory Visit: Payer: Self-pay | Admitting: Physician Assistant

## 2018-04-30 DIAGNOSIS — F3181 Bipolar II disorder: Secondary | ICD-10-CM

## 2018-04-30 MED ORDER — ESCITALOPRAM OXALATE 5 MG PO TABS: 5.0000 mg | ORAL_TABLET | Freq: Every day | ORAL | 5 refills | Status: DC

## 2018-04-30 MED ORDER — LAMOTRIGINE 100 MG PO TABS: 100.0000 mg | ORAL_TABLET | Freq: Every day | ORAL | 5 refills | Status: DC

## 2018-04-30 NOTE — Telephone Encounter (Signed)
Pt needs refill on  Escitalopram 5 mg  Lamotrigine 100 mg  [facility]  Thanks [employer]

## 2018-05-05 ENCOUNTER — Ambulatory Visit: Payer: Medicaid Other | Admitting: Physician Assistant

## 2018-05-07 ENCOUNTER — Telehealth: Payer: Self-pay | Admitting: Physician Assistant

## 2018-05-07 NOTE — Telephone Encounter (Signed)
We can schedule her for an evisit next week.

## 2018-05-07 NOTE — Telephone Encounter (Signed)
PT Called saying she missed an appt on Wednesday to FU with Antony Contras to get back on some medications that she was taking before her pregnancy.  She thought the office was closed because when she called at lunch it said the office was not open and she did not listen to the whole messsage.  She would rather not come in because she said she has to bring the baby and her 29 year old with her everywhere now.  She would like someone to call her back and see if she can get back on her prescriptions with out being seen  Pt's CB# is [phone]  Thanks Barth Kirks

## 2018-05-10 ENCOUNTER — Encounter: Payer: Self-pay | Admitting: Physician Assistant

## 2018-05-10 ENCOUNTER — Ambulatory Visit (INDEPENDENT_AMBULATORY_CARE_PROVIDER_SITE_OTHER): Payer: Self-pay | Admitting: Physician Assistant

## 2018-05-10 DIAGNOSIS — F3181 Bipolar II disorder: Secondary | ICD-10-CM

## 2018-05-10 DIAGNOSIS — F39 Unspecified mood [affective] disorder: Secondary | ICD-10-CM

## 2018-05-10 DIAGNOSIS — F900 Attention-deficit hyperactivity disorder, predominantly inattentive type: Secondary | ICD-10-CM

## 2018-05-10 DIAGNOSIS — M19171 Post-traumatic osteoarthritis, right ankle and foot: Secondary | ICD-10-CM

## 2018-05-10 MED ORDER — MELOXICAM 15 MG PO TABS: 15.0000 mg | ORAL_TABLET | Freq: Every day | ORAL | 1 refills | Status: DC

## 2018-05-10 MED ORDER — ESCITALOPRAM OXALATE 5 MG PO TABS: 5.0000 mg | ORAL_TABLET | Freq: Every day | ORAL | 1 refills | Status: DC

## 2018-05-10 MED ORDER — AMPHETAMINE-DEXTROAMPHETAMINE 10 MG PO TABS: 10.0000 mg | ORAL_TABLET | Freq: Every day | ORAL | 0 refills | Status: DC

## 2018-05-10 MED ORDER — LAMOTRIGINE 100 MG PO TABS: 100.0000 mg | ORAL_TABLET | Freq: Every day | ORAL | 1 refills | Status: DC

## 2018-05-10 NOTE — Progress Notes (Signed)
Virtual Visit via Video Note  I connected with [name] [name] on 05/10/18 at  2:00 PM EDT by a video enabled telemedicine application and verified that I am speaking with the correct person using two identifiers.   I discussed the limitations of evaluation and management by telemedicine and the availability of in person appointments. The patient expressed understanding and agreed to proceed.  History of Present Illness: [name] [name] is a 28 year old female that is following up to re-establish medications for Bipolar, ADHD and right foot pain. She stopped all medications during her pregnancy. Her son is now 73 months old and she is no longer breast feeding. She does also have a 29 year old. She has also returned back to work at [employer].     Observations/Objective: WDWN female in no apparent distress. No respiratory distress. Normal mood. Hyper affect. Normal speech. No suicidal/homicidal ideations.    Assessment and Plan: 1. Post-traumatic arthritis of right ankle Restart medication. Diagnosis pulled for medication refill. Continue current medical treatment plan. - meloxicam (MOBIC) 15 MG tablet; Take 1 tablet (15 mg total) by mouth daily.  Dispense: 90 tablet; Refill: 1  2. Affective disorder (HCC) Stable on lamictal and lexapro.   3. Attention deficit hyperactivity disorder (ADHD), predominantly inattentive type Restart medication as below. Will f/u in 4-6 weeks. - amphetamine-dextroamphetamine (ADDERALL) 10 MG tablet; Take 1 tablet (10 mg total) by mouth daily with breakfast.  Dispense: 90 tablet; Refill: 0  4. Bipolar 2 disorder (HCC) Stable. Diagnosis pulled for medication refill. Continue current medical treatment plan. - escitalopram (LEXAPRO) 5 MG tablet; Take 1 tablet (5 mg total) by mouth daily.  Dispense: 90 tablet; Refill: 1 - lamoTRIgine (LAMICTAL) 100 MG tablet; Take 1 tablet (100 mg total) by mouth daily.  Dispense: 90 tablet; Refill: 1   Follow Up Instructions: 4-6  week medication management via office visit or webex pending Covid situation.   I discussed the assessment and treatment plan with the patient. The patient was provided an opportunity to ask questions and all were answered. The patient agreed with the plan and demonstrated an understanding of the instructions.   The patient was advised to call back or seek an in-person evaluation if the symptoms worsen or if the condition fails to improve as anticipated.  I provided 25 minutes of non-face-to-face time during this encounter.   Margaretann Loveless, PA-C

## 2018-07-22 ENCOUNTER — Encounter: Payer: Self-pay | Admitting: Adult Health

## 2018-07-22 ENCOUNTER — Other Ambulatory Visit: Payer: Self-pay

## 2018-07-22 ENCOUNTER — Ambulatory Visit (INDEPENDENT_AMBULATORY_CARE_PROVIDER_SITE_OTHER): Payer: Medicaid Other | Admitting: Adult Health

## 2018-07-22 VITALS — Ht 63.0 in

## 2018-07-22 DIAGNOSIS — Z3009 Encounter for other general counseling and advice on contraception: Secondary | ICD-10-CM | POA: Insufficient documentation

## 2018-07-22 DIAGNOSIS — Z3A01 Less than 8 weeks gestation of pregnancy: Secondary | ICD-10-CM | POA: Insufficient documentation

## 2018-07-22 DIAGNOSIS — O3680X0 Pregnancy with inconclusive fetal viability, not applicable or unspecified: Secondary | ICD-10-CM

## 2018-07-22 DIAGNOSIS — Z3201 Encounter for pregnancy test, result positive: Secondary | ICD-10-CM | POA: Insufficient documentation

## 2018-07-22 MED ORDER — PNV PRENATAL PLUS MULTIVITAMIN 27-1 MG PO TABS: 1.0000 | ORAL_TABLET | Freq: Every day | ORAL | 11 refills | Status: DC

## 2018-07-22 MED ORDER — OMEPRAZOLE 20 MG PO CPDR: 20.0000 mg | DELAYED_RELEASE_CAPSULE | Freq: Every day | ORAL | 3 refills | Status: DC

## 2018-07-22 NOTE — Progress Notes (Signed)
Patient ID: [name] [name], female   DOB: 1990-04-24, 28 y.o.   MRN: [id number]   TELEHEALTH VIRTUAL GYNECOLOGY VISIT ENCOUNTER NOTE  I connected with [name] [name] on 07/22/18 at  8:30 AM EDT by telephone at home and verified that I am speaking with the correct person using two identifiers.   I discussed the limitations, risks, security and privacy concerns of performing an evaluation and management service by telephone and the availability of in person appointments. I also discussed with the patient that there may be a patient responsible charge related to this service. The patient expressed understanding and agreed to proceed.   History:  [name] GingerCheryl Quraishi is a 28 y.o. [phone] white female,single being evaluated today for missing a period and had 1+HPT, LMP 06/16/18 with EDD 03/23/2019, about 5+1 week, this pregnancy was not planned, but she is OK.  .She hs 27 month old at home, and is having reflux.  She denies any abnormal vaginal discharge, bleeding, pelvic pain or other concerns.   PCP was Joycelyn ManJennifer Burnette PA.     Past Medical History:  Diagnosis Date  . ADD (attention deficit disorder)   . ADHD (attention deficit hyperactivity disorder)   . Anxiety   . Bipolar disorder (HCC)   . Depression    manid depressive w/ generalized anxiety  . Frequent UTI   . PMDD (premenstrual dysphoric disorder)    Past Surgical History:  Procedure Laterality Date  . TONSILLECTOMY     The following portions of the patient's history were reviewed and updated as appropriate: allergies, current medications, past family history, past medical history, past social history, past surgical history and problem list.   Health Maintenance:  Normal pap 01/30/16.  Review of Systems:  Pertinent items noted in HPI and remainder of comprehensive ROS otherwise negative.  Physical Exam:   General:  Alert, oriented and cooperative.   Mental Status: Normal mood and affect perceived. Normal judgment and thought content.   Physical exam deferred due to nature of the encounter  Labs and Imaging No results found for this or any previous visit (from the past 336 hour(s)). No results found.    Assessment and Plan:     1. Positive pregnancy test +HPT x 1   2. Less than [redacted] weeks gestation of pregnancy -eat often -take PNV Meds ordered this encounter  Medications  . Prenatal Vit-Fe Fumarate-FA (PNV PRENATAL PLUS MULTIVITAMIN) 27-1 MG TABS    Sig: Take 1 tablet by mouth daily.    Dispense:  30 tablet    Refill:  11    Order Specific Question:   Supervising Provider    Answer:   Despina HiddenEURE, LUTHER H [[id number]]  . omeprazole (PRILOSEC) 20 MG capsule    Sig: Take 1 capsule (20 mg total) by mouth daily.    Dispense:  30 capsule    Refill:  3    Order Specific Question:   Supervising Provider    Answer:   Despina HiddenEURE, LUTHER H [[id number]]    3. Encounter to determine fetal viability of pregnancy, single or unspecified fetus -will get US in 3 weeks  - US OB Comp Less 14 Wks; Future       I discussed the assessment and treatment plan with the patient. The patient was provided an opportunity to ask questions and all were answered. The patient agreed with the plan and demonstrated an understanding of the instructions.   The patient was advised to call back or seek an in-person evaluation/go to the ED if  the symptoms worsen or if the condition fails to improve as anticipated.  I provided 7 minutes of non-face-to-face time during this encounter.   Cyril Mourning, NP [facility] [employer]

## 2018-08-12 ENCOUNTER — Other Ambulatory Visit: Payer: Self-pay

## 2018-08-12 ENCOUNTER — Ambulatory Visit (INDEPENDENT_AMBULATORY_CARE_PROVIDER_SITE_OTHER): Payer: Medicaid Other

## 2018-08-12 DIAGNOSIS — O3680X0 Pregnancy with inconclusive fetal viability, not applicable or unspecified: Secondary | ICD-10-CM

## 2018-08-12 DIAGNOSIS — Z3A08 8 weeks gestation of pregnancy: Secondary | ICD-10-CM | POA: Diagnosis not present

## 2018-08-12 NOTE — Progress Notes (Signed)
[place] 6+5 wks,single IUP w/ys,positive fht 121 bpm,normal ovaries bilat,crl 8.33 mm

## 2018-09-21 ENCOUNTER — Other Ambulatory Visit: Payer: Self-pay | Admitting: Obstetrics & Gynecology

## 2018-09-21 DIAGNOSIS — Z3682 Encounter for antenatal screening for nuchal translucency: Secondary | ICD-10-CM

## 2018-09-22 ENCOUNTER — Ambulatory Visit (INDEPENDENT_AMBULATORY_CARE_PROVIDER_SITE_OTHER): Payer: Medicaid Other

## 2018-09-22 ENCOUNTER — Encounter: Payer: Self-pay | Admitting: Advanced Practice Midwife

## 2018-09-22 ENCOUNTER — Ambulatory Visit: Payer: Medicaid Other | Admitting: *Deleted

## 2018-09-22 ENCOUNTER — Other Ambulatory Visit: Payer: Self-pay

## 2018-09-22 ENCOUNTER — Ambulatory Visit (INDEPENDENT_AMBULATORY_CARE_PROVIDER_SITE_OTHER): Payer: Medicaid Other | Admitting: Advanced Practice Midwife

## 2018-09-22 VITALS — BP 110/71 | HR 83 | Wt 185.0 lb

## 2018-09-22 DIAGNOSIS — Z1379 Encounter for other screening for genetic and chromosomal anomalies: Secondary | ICD-10-CM

## 2018-09-22 DIAGNOSIS — Z3481 Encounter for supervision of other normal pregnancy, first trimester: Secondary | ICD-10-CM

## 2018-09-22 DIAGNOSIS — Z6791 Unspecified blood type, Rh negative: Secondary | ICD-10-CM | POA: Insufficient documentation

## 2018-09-22 DIAGNOSIS — Z1389 Encounter for screening for other disorder: Secondary | ICD-10-CM

## 2018-09-22 DIAGNOSIS — Z1371 Encounter for nonprocreative screening for genetic disease carrier status: Secondary | ICD-10-CM

## 2018-09-22 DIAGNOSIS — Z3490 Encounter for supervision of normal pregnancy, unspecified, unspecified trimester: Secondary | ICD-10-CM | POA: Insufficient documentation

## 2018-09-22 DIAGNOSIS — Z3682 Encounter for antenatal screening for nuchal translucency: Secondary | ICD-10-CM | POA: Diagnosis not present

## 2018-09-22 DIAGNOSIS — O99341 Other mental disorders complicating pregnancy, first trimester: Secondary | ICD-10-CM | POA: Diagnosis not present

## 2018-09-22 DIAGNOSIS — O0990 Supervision of high risk pregnancy, unspecified, unspecified trimester: Secondary | ICD-10-CM | POA: Insufficient documentation

## 2018-09-22 DIAGNOSIS — O09899 Supervision of other high risk pregnancies, unspecified trimester: Secondary | ICD-10-CM | POA: Insufficient documentation

## 2018-09-22 DIAGNOSIS — O26899 Other specified pregnancy related conditions, unspecified trimester: Secondary | ICD-10-CM

## 2018-09-22 DIAGNOSIS — Z3A12 12 weeks gestation of pregnancy: Secondary | ICD-10-CM

## 2018-09-22 DIAGNOSIS — O09891 Supervision of other high risk pregnancies, first trimester: Secondary | ICD-10-CM

## 2018-09-22 DIAGNOSIS — Z331 Pregnant state, incidental: Secondary | ICD-10-CM

## 2018-09-22 LAB — POCT URINALYSIS DIPSTICK OB
Blood, UA: NEGATIVE
Glucose, UA: NEGATIVE
Ketones, UA: NEGATIVE
Leukocytes, UA: NEGATIVE
Nitrite, UA: NEGATIVE
POC,PROTEIN,UA: NEGATIVE

## 2018-09-22 MED ORDER — BLOOD PRESSURE MONITOR MISC: 0 refills | Status: DC

## 2018-09-22 NOTE — Progress Notes (Signed)
INITIAL OBSTETRICAL VISIT Patient name: [name] [name] MRN [id number]  Date of birth: 11-03-1990 Chief Complaint:   Initial Prenatal Visit (nt/it)  History of Present Illness:   [name] [name] is a 28 y.o. [id number] Caucasian female at 6867w4d by 6 week US with an Estimated Date of Delivery: 04/02/19 being seen today for her initial obstetrical visit.   Her obstetrical history is significant for term SVD X2 (last one 9 months ago). She is bipolar, on Lamictal and Lexapro. Just started back, feeling well .  Today she reports no complaints.  Patient's last menstrual period was 06/16/2018. Last pap 11/24/16. Results were: normal Review of Systems:   Pertinent items are noted in HPI Denies cramping/contractions, leakage of fluid, vaginal bleeding, abnormal vaginal discharge w/ itching/odor/irritation, headaches, visual changes, shortness of breath, chest pain, abdominal pain, severe nausea/vomiting, or problems with urination or bowel movements unless otherwise stated above.  Pertinent History Reviewed:  Reviewed past medical,surgical, social, obstetrical and family history.  Reviewed problem list, medications and allergies. OB History  Gravida Para Term Preterm AB Living  4 2 2   1 2   SAB TAB Ectopic Multiple Live Births  1     0 2    # Outcome Date GA Lbr Len/2nd Weight Sex Delivery Anes PTL Lv  4 Current           3 Term 12/18/17 7068w2d / 00:09 7 lb 6.9 oz (3.37 kg) M Vag-Spont EPI  LIV  2 SAB 11/2016          1 Term 10/18/10 6481w0d  6 lb 1 oz (2.75 kg) M Vag-Spont EPI N LIV   Physical Assessment:   Vitals:   09/22/18 1021  BP: 110/71  Pulse: 83  Weight: 185 lb (83.9 kg)  Body mass index is 32.77 kg/m.       Physical Examination:  General appearance - well appearing, and in no distress  Mental status - alert, oriented to person, place, and time  Psych:  She has a normal mood and affect  Skin - warm and dry, normal color, no suspicious lesions noted  Chest - effort normal, all lung  fields clear to auscultation bilaterally  Heart - normal rate and regular rhythm  Abdomen - soft, nontender  Extremities:  No swelling or varicosities noted  Fetal Heart Rate (bpm): 151 via US  US 12+4 wks,measurements c/w dates,crl 60.71 mm,fhr 151 bpm,NB present,NT 1.7 mm,normal ovaries bilat  Results for orders placed or performed in visit on 09/22/18 (from the past 24 hour(s))  POC Urinalysis Dipstick OB   Collection Time: 09/22/18 10:36 AM  Result Value Ref Range   Color, UA     Clarity, UA     Glucose, UA Negative Negative   Bilirubin, UA     Ketones, UA neg    Spec Grav, UA     Blood, UA neg    pH, UA     POC,PROTEIN,UA Negative Negative, Trace, Small (1+), Moderate (2+), Large (3+), 4+   Urobilinogen, UA     Nitrite, UA neg    Leukocytes, UA Negative Negative   Appearance     Odor      Assessment & Plan:  1) Low-Risk Pregnancy [id number] at 10867w4d with an Estimated Date of Delivery: 04/02/19   2) Initial OB visit  3) Bipolar:  Continue lamictal and lexapro  Meds:  Meds ordered this encounter  Medications  . Blood Pressure Monitor MISC    Sig: For regular home bp monitoring during pregnancy  Dispense:  1 each    Refill:  0    Z34.90    Initial labs obtained Continue prenatal vitamins Reviewed n/v relief measures and warning s/s to report Reviewed recommended weight gain based on pre-gravid BMI Encouraged well-balanced diet Watched video for carrier screening/genetic testing:  Genetic Screening discussed Integrated Screen: requested Cystic fibrosis screening requested SMA screening requested Fragile X screening requested Ultrasound discussed; fetal survey: requested CCNC completed  Follow-up: Return in about 3 weeks (around 10/13/2018) for OB Mychart visit.   Orders Placed This Encounter  Procedures  . GC/Chlamydia Probe Amp  . Urine Culture  . Integrated 1  . Inheritest Core(CF97,SMA,FraX)  . Obstetric Panel, Including HIV  . Sickle cell screen  .  Pain Management Screening Profile (10S)  . Urinalysis, Routine w reflex microscopic  . POC Urinalysis Dipstick OB    Christin Fudge DNP, CNM 09/22/2018 11:05 AM

## 2018-09-22 NOTE — Progress Notes (Signed)
[place] 12+4 wks,measurements c/w dates,crl 60.71 mm,fhr 151 bpm,NB present,NT 1.7 mm,normal ovaries bilat

## 2018-09-22 NOTE — Patient Instructions (Signed)
 First Trimester of Pregnancy The first trimester of pregnancy is from week 1 until the end of week 12 (months 1 through 3). A week after a sperm fertilizes an egg, the egg will implant on the wall of the uterus. This embryo will begin to develop into a baby. Genes from you and your partner are forming the baby. The female genes determine whether the baby is a boy or a girl. At 6-8 weeks, the eyes and face are formed, and the heartbeat can be seen on ultrasound. At the end of 12 weeks, all the baby's organs are formed.  Now that you are pregnant, you will want to do everything you can to have a healthy baby. Two of the most important things are to get good prenatal care and to follow your health care provider's instructions. Prenatal care is all the medical care you receive before the baby's birth. This care will help prevent, find, and treat any problems during the pregnancy and childbirth. BODY CHANGES Your body goes through many changes during pregnancy. The changes vary from woman to woman.   You may gain or lose a couple of pounds at first.  You may feel sick to your stomach (nauseous) and throw up (vomit). If the vomiting is uncontrollable, call your health care provider.  You may tire easily.  You may develop headaches that can be relieved by medicines approved by your health care provider.  You may urinate more often. Painful urination may mean you have a bladder infection.  You may develop heartburn as a result of your pregnancy.  You may develop constipation because certain hormones are causing the muscles that push waste through your intestines to slow down.  You may develop hemorrhoids or swollen, bulging veins (varicose veins).  Your breasts may begin to grow larger and become tender. Your nipples may stick out more, and the tissue that surrounds them (areola) may become darker.  Your gums may bleed and may be sensitive to brushing and flossing.  Dark spots or blotches  (chloasma, mask of pregnancy) may develop on your face. This will likely fade after the baby is born.  Your menstrual periods will stop.  You may have a loss of appetite.  You may develop cravings for certain kinds of food.  You may have changes in your emotions from day to day, such as being excited to be pregnant or being concerned that something may go wrong with the pregnancy and baby.  You may have more vivid and strange dreams.  You may have changes in your hair. These can include thickening of your hair, rapid growth, and changes in texture. Some women also have hair loss during or after pregnancy, or hair that feels dry or thin. Your hair will most likely return to normal after your baby is born. WHAT TO EXPECT AT YOUR PRENATAL VISITS During a routine prenatal visit:  You will be weighed to make sure you and the baby are growing normally.  Your blood pressure will be taken.  Your abdomen will be measured to track your baby's growth.  The fetal heartbeat will be listened to starting around week 10 or 12 of your pregnancy.  Test results from any previous visits will be discussed. Your health care provider may ask you:  How you are feeling.  If you are feeling the baby move.  If you have had any abnormal symptoms, such as leaking fluid, bleeding, severe headaches, or abdominal cramping.  If you have any questions. Other   tests that may be performed during your first trimester include:  Blood tests to find your blood type and to check for the presence of any previous infections. They will also be used to check for low iron levels (anemia) and Rh antibodies. Later in the pregnancy, blood tests for diabetes will be done along with other tests if problems develop.  Urine tests to check for infections, diabetes, or protein in the urine.  An ultrasound to confirm the proper growth and development of the baby.  An amniocentesis to check for possible genetic problems.  Fetal  screens for spina bifida and Down syndrome.  You may need other tests to make sure you and the baby are doing well. HOME CARE INSTRUCTIONS  Medicines  Follow your health care provider's instructions regarding medicine use. Specific medicines may be either safe or unsafe to take during pregnancy.  Take your prenatal vitamins as directed.  If you develop constipation, try taking a stool softener if your health care provider approves. Diet  Eat regular, well-balanced meals. Choose a variety of foods, such as meat or vegetable-based protein, fish, milk and low-fat dairy products, vegetables, fruits, and whole grain breads and cereals. Your health care provider will help you determine the amount of weight gain that is right for you.  Avoid raw meat and uncooked cheese. These carry germs that can cause birth defects in the baby.  Eating four or five small meals rather than three large meals a day may help relieve nausea and vomiting. If you start to feel nauseous, eating a few soda crackers can be helpful. Drinking liquids between meals instead of during meals also seems to help nausea and vomiting.  If you develop constipation, eat more high-fiber foods, such as fresh vegetables or fruit and whole grains. Drink enough fluids to keep your urine clear or pale yellow. Activity and Exercise  Exercise only as directed by your health care provider. Exercising will help you:  Control your weight.  Stay in shape.  Be prepared for labor and delivery.  Experiencing pain or cramping in the lower abdomen or low back is a good sign that you should stop exercising. Check with your health care provider before continuing normal exercises.  Try to avoid standing for long periods of time. Move your legs often if you must stand in one place for a long time.  Avoid heavy lifting.  Wear low-heeled shoes, and practice good posture.  You may continue to have sex unless your health care provider directs you  otherwise. Relief of Pain or Discomfort  Wear a good support bra for breast tenderness.   Take warm sitz baths to soothe any pain or discomfort caused by hemorrhoids. Use hemorrhoid cream if your health care provider approves.   Rest with your legs elevated if you have leg cramps or low back pain.  If you develop varicose veins in your legs, wear support hose. Elevate your feet for 15 minutes, 3-4 times a day. Limit salt in your diet. Prenatal Care  Schedule your prenatal visits by the twelfth week of pregnancy. They are usually scheduled monthly at first, then more often in the last 2 months before delivery.  Write down your questions. Take them to your prenatal visits.  Keep all your prenatal visits as directed by your health care provider. Safety  Wear your seat belt at all times when driving.  Make a list of emergency phone numbers, including numbers for family, friends, the hospital, and police and fire departments. General   Tips  Ask your health care provider for a referral to a local prenatal education class. Begin classes no later than at the beginning of month 6 of your pregnancy.  Ask for help if you have counseling or nutritional needs during pregnancy. Your health care provider can offer advice or refer you to specialists for help with various needs.  Do not use hot tubs, steam rooms, or saunas.  Do not douche or use tampons or scented sanitary pads.  Do not cross your legs for long periods of time.  Avoid cat litter boxes and soil used by cats. These carry germs that can cause birth defects in the baby and possibly loss of the fetus by miscarriage or stillbirth.  Avoid all smoking, herbs, alcohol, and medicines not prescribed by your health care provider. Chemicals in these affect the formation and growth of the baby.  Schedule a dentist appointment. At home, brush your teeth with a soft toothbrush and be gentle when you floss. SEEK MEDICAL CARE IF:   You have  dizziness.  You have mild pelvic cramps, pelvic pressure, or nagging pain in the abdominal area.  You have persistent nausea, vomiting, or diarrhea.  You have a bad smelling vaginal discharge.  You have pain with urination.  You notice increased swelling in your face, hands, legs, or ankles. SEEK IMMEDIATE MEDICAL CARE IF:   You have a fever.  You are leaking fluid from your vagina.  You have spotting or bleeding from your vagina.  You have severe abdominal cramping or pain.  You have rapid weight gain or loss.  You vomit blood or material that looks like coffee grounds.  You are exposed to German measles and have never had them.  You are exposed to fifth disease or chickenpox.  You develop a severe headache.  You have shortness of breath.  You have any kind of trauma, such as from a fall or a car accident. Document Released: 01/28/2001 Document Revised: 06/20/2013 Document Reviewed: 12/14/2012 ExitCare Patient Information 2015 [employer]. This information is not intended to replace advice given to you by your health care provider. Make sure you discuss any questions you have with your health care provider.   Nausea & Vomiting  Have saltine crackers or pretzels by your bed and eat a few bites before you raise your head out of bed in the morning  Eat small frequent meals throughout the day instead of large meals  Drink plenty of fluids throughout the day to stay hydrated, just don't drink a lot of fluids with your meals.  This can make your stomach fill up faster making you feel sick  Do not brush your teeth right after you eat  Products with real ginger are good for nausea, like ginger ale and ginger hard candy Make sure it says made with real ginger!  Sucking on sour candy like lemon heads is also good for nausea  If your prenatal vitamins make you nauseated, take them at night so you will sleep through the nausea  Sea Bands  If you feel like you need  medicine for the nausea & vomiting please let us know  If you are unable to keep any fluids or food down please let us know   Constipation  Drink plenty of fluid, preferably water, throughout the day  Eat foods high in fiber such as fruits, vegetables, and grains  Exercise, such as walking, is a good way to keep your bowels regular  Drink warm fluids, especially warm   prune juice, or decaf coffee  Eat a 1/2 cup of real oatmeal (not instant), 1/2 cup applesauce, and 1/2-1 cup warm prune juice every day  If needed, you may take Colace (docusate sodium) stool softener once or twice a day to help keep the stool soft. If you are pregnant, wait until you are out of your first trimester (12-14 weeks of pregnancy)  If you still are having problems with constipation, you may take Miralax once daily as needed to help keep your bowels regular.  If you are pregnant, wait until you are out of your first trimester (12-14 weeks of pregnancy)  Safe Medications in Pregnancy   Acne: Benzoyl Peroxide Salicylic Acid  Backache/Headache: Tylenol: 2 regular strength every 4 hours OR              2 Extra strength every 6 hours  Colds/Coughs/Allergies: Benadryl (alcohol free) 25 mg every 6 hours as needed Breath right strips Claritin Cepacol throat lozenges Chloraseptic throat spray Cold-Eeze- up to three times per day Cough drops, alcohol free Flonase (by prescription only) Guaifenesin Mucinex Robitussin DM (plain only, alcohol free) Saline nasal spray/drops Sudafed (pseudoephedrine) & Actifed ** use only after [redacted] weeks gestation and if you do not have high blood pressure Tylenol Vicks Vaporub Zinc lozenges Zyrtec   Constipation: Colace Ducolax suppositories Fleet enema Glycerin suppositories Metamucil Milk of magnesia Miralax Senokot Smooth move tea  Diarrhea: Kaopectate Imodium A-D  *NO pepto Bismol  Hemorrhoids: Anusol Anusol HC Preparation  H Tucks  Indigestion: Tums Maalox Mylanta Zantac  Pepcid  Insomnia: Benadryl (alcohol free) 25mg every 6 hours as needed Tylenol PM Unisom, no Gelcaps  Leg Cramps: Tums MagGel  Nausea/Vomiting:  Bonine Dramamine Emetrol Ginger extract Sea bands Meclizine  Nausea medication to take during pregnancy:  Unisom (doxylamine succinate 25 mg tablets) Take one tablet daily at bedtime. If symptoms are not adequately controlled, the dose can be increased to a maximum recommended dose of two tablets daily (1/2 tablet in the morning, 1/2 tablet mid-afternoon and one at bedtime). Vitamin B6 100mg tablets. Take one tablet twice a day (up to 200 mg per day).  Skin Rashes: Aveeno products Benadryl cream or 25mg every 6 hours as needed Calamine Lotion 1% cortisone cream  Yeast infection: Gyne-lotrimin 7 Monistat 7   **If taking multiple medications, please check labels to avoid duplicating the same active ingredients **take medication as directed on the label ** Do not exceed 4000 mg of tylenol in 24 hours **Do not take medications that contain aspirin or ibuprofen      

## 2018-09-23 LAB — PMP SCREEN PROFILE (10S), URINE
Amphetamine Scrn, Ur: NEGATIVE ng/mL
BARBITURATE SCREEN URINE: NEGATIVE ng/mL
BENZODIAZEPINE SCREEN, URINE: NEGATIVE ng/mL
CANNABINOIDS UR QL SCN: POSITIVE ng/mL — AB
Cocaine (Metab) Scrn, Ur: NEGATIVE ng/mL
Creatinine(Crt), U: 54.8 mg/dL (ref 20.0–300.0)
Methadone Screen, Urine: NEGATIVE ng/mL
OXYCODONE+OXYMORPHONE UR QL SCN: NEGATIVE ng/mL
Opiate Scrn, Ur: NEGATIVE ng/mL
Ph of Urine: 7.3 (ref 4.5–8.9)
Phencyclidine Qn, Ur: NEGATIVE ng/mL
Propoxyphene Scrn, Ur: NEGATIVE ng/mL

## 2018-09-23 LAB — URINALYSIS, ROUTINE W REFLEX MICROSCOPIC
Bilirubin, UA: NEGATIVE
Glucose, UA: NEGATIVE
Ketones, UA: NEGATIVE
Nitrite, UA: NEGATIVE
Protein,UA: NEGATIVE
RBC, UA: NEGATIVE
Specific Gravity, UA: 1.009 (ref 1.005–1.030)
Urobilinogen, Ur: 0.2 mg/dL (ref 0.2–1.0)
pH, UA: 7.5 (ref 5.0–7.5)

## 2018-09-23 LAB — MICROSCOPIC EXAMINATION: Casts: NONE SEEN /lpf

## 2018-09-23 LAB — SPECIMEN STATUS REPORT

## 2018-09-24 LAB — URINE CULTURE: Organism ID, Bacteria: NO GROWTH

## 2018-09-24 LAB — SPECIMEN STATUS REPORT

## 2018-09-26 LAB — SPECIMEN STATUS REPORT

## 2018-09-26 LAB — GC/CHLAMYDIA PROBE AMP
Chlamydia trachomatis, NAA: NEGATIVE
Neisseria Gonorrhoeae by PCR: NEGATIVE

## 2018-10-11 LAB — INHERITEST CORE(CF97,SMA,FRAX)

## 2018-10-11 LAB — OBSTETRIC PANEL, INCLUDING HIV
Antibody Screen: NEGATIVE
Basophils Absolute: 0 10*3/uL (ref 0.0–0.2)
Basos: 0 %
EOS (ABSOLUTE): 0.1 10*3/uL (ref 0.0–0.4)
Eos: 1 %
HIV Screen 4th Generation wRfx: NONREACTIVE
Hematocrit: 38.5 % (ref 34.0–46.6)
Hemoglobin: 12.3 g/dL (ref 11.1–15.9)
Hepatitis B Surface Ag: NEGATIVE
Immature Grans (Abs): 0 10*3/uL (ref 0.0–0.1)
Immature Granulocytes: 0 %
Lymphocytes Absolute: 2.4 10*3/uL (ref 0.7–3.1)
Lymphs: 25 %
MCH: 23.7 pg — ABNORMAL LOW (ref 26.6–33.0)
MCHC: 31.9 g/dL (ref 31.5–35.7)
MCV: 74 fL — ABNORMAL LOW (ref 79–97)
Monocytes Absolute: 0.3 10*3/uL (ref 0.1–0.9)
Monocytes: 4 %
Neutrophils Absolute: 6.9 10*3/uL (ref 1.4–7.0)
Neutrophils: 70 %
Platelets: 223 10*3/uL (ref 150–450)
RBC: 5.2 x10E6/uL (ref 3.77–5.28)
RDW: 16.3 % — ABNORMAL HIGH (ref 11.7–15.4)
RPR Ser Ql: NONREACTIVE
Rh Factor: NEGATIVE
Rubella Antibodies, IGG: 2.45 index (ref >0.99)
WBC: 9.8 10*3/uL (ref 3.4–10.8)

## 2018-10-11 LAB — INTEGRATED 1
Crown Rump Length: 60.7 mm
Gest. Age on Collection Date: 12.3 weeks
Maternal Age at EDD: 29.4 yr
Nuchal Translucency (NT): 1.7 mm
Number of Fetuses: 1
PAPP-A Value: 565.3 ng/mL
Weight: 185 [lb_av]

## 2018-10-11 LAB — SICKLE CELL SCREEN: Sickle Cell Screen: NEGATIVE

## 2018-10-13 ENCOUNTER — Other Ambulatory Visit: Payer: Self-pay

## 2018-10-13 ENCOUNTER — Telehealth: Payer: Medicaid Other | Admitting: Obstetrics and Gynecology

## 2018-10-21 ENCOUNTER — Other Ambulatory Visit: Payer: Self-pay

## 2018-10-21 ENCOUNTER — Ambulatory Visit (INDEPENDENT_AMBULATORY_CARE_PROVIDER_SITE_OTHER): Payer: Medicaid Other | Admitting: Obstetrics and Gynecology

## 2018-10-21 VITALS — BP 98/60 | HR 87 | Wt 184.0 lb

## 2018-10-21 DIAGNOSIS — Z3481 Encounter for supervision of other normal pregnancy, first trimester: Secondary | ICD-10-CM

## 2018-10-21 DIAGNOSIS — Z3009 Encounter for other general counseling and advice on contraception: Secondary | ICD-10-CM

## 2018-10-21 DIAGNOSIS — Z3A16 16 weeks gestation of pregnancy: Secondary | ICD-10-CM

## 2018-10-21 MED ORDER — BLOOD PRESSURE CUFF MISC: 1.0000 | 0 refills | Status: DC

## 2018-10-21 MED ORDER — OMEPRAZOLE 20 MG PO CPDR: 20.0000 mg | DELAYED_RELEASE_CAPSULE | Freq: Every day | ORAL | 3 refills | Status: DC

## 2018-10-21 NOTE — Progress Notes (Signed)
  LOW-RISK PREGNANCY VISIT Patient name: [name] [name] MRN [id number]  Date of birth: 1990-02-21 Chief Complaint:   Routine Prenatal Visit  History of Present Illness:   [name] [name] is a 28 y.o. [id number] female at [redacted]w[redacted]d with an Estimated Date of Delivery: 04/02/19 being seen today for ongoing management of a low-risk pregnancy.  Today she reports no complaints. Contractions: Not present.  .  Movement: Absent. denies leaking of fluid. Review of Systems:   Pertinent items are noted in HPI Denies abnormal vaginal discharge w/ itching/odor/irritation, headaches, visual changes, shortness of breath, chest pain, abdominal pain, severe nausea/vomiting, or problems with urination or bowel movements unless otherwise stated above. Pertinent History Reviewed:  Reviewed past medical,surgical, social, obstetrical and family history.  Reviewed problem list, medications and allergies. Physical Assessment:   Vitals:   10/21/18 0848  BP: 98/60  Pulse: 87  Weight: 184 lb (83.5 kg)  Body mass index is 32.59 kg/m.        Physical Examination:   General appearance: Well appearing, and in no distress  Mental status: Alert, oriented to person, place, and time  Skin: Warm & dry  Cardiovascular: Normal heart rate noted  Respiratory: Normal respiratory effort, no distress  Abdomen: Soft, gravid, nontender gravid uterus at U- 2  Pelvic: Cervical exam deferred         Extremities: Edema: None  Fetal Status:     139  No results found for this or any previous visit (from the past 24 hour(s)).  Assessment & Plan:  1) Low-risk pregnancy [id number] at [redacted]w[redacted]d with an Estimated Date of Delivery: 04/02/19   2) desires permanent sterilization, to sign tubal papers at 18-week visit, along with ultrasound 3.  Needs second IT at 18-week ultrasound Plan:  Continue routine obstetrical care :           Anatomy ultrasound tubal papers and second IT at 18.5-week ultrasound   Meds: No orders of the defined types were  placed in this encounter.  Labs/procedures today: None   Reviewed: general obstetric precautions including but not limited to vaginal bleeding, contractions, leaking of fluid and fetal movement were reviewed in detail with the patient.  All questions were answered patient declined second IT today.  Blood pressure cuff ordered  Follow-up: No follow-ups on file.  No orders of the defined types were placed in this encounter.  @Halei Hanover  France Ravens, MD  10/21/2018

## 2018-11-01 ENCOUNTER — Other Ambulatory Visit: Payer: Self-pay | Admitting: Physician Assistant

## 2018-11-01 DIAGNOSIS — F3181 Bipolar II disorder: Secondary | ICD-10-CM

## 2018-11-01 MED ORDER — ESCITALOPRAM OXALATE 5 MG PO TABS: 5.0000 mg | ORAL_TABLET | Freq: Every day | ORAL | 1 refills | Status: DC

## 2018-11-01 MED ORDER — LAMOTRIGINE 100 MG PO TABS: 100.0000 mg | ORAL_TABLET | Freq: Every day | ORAL | 1 refills | Status: DC

## 2018-11-01 NOTE — Telephone Encounter (Signed)
Pt needs refill on  Escitalopram 5 mg  Lamotrigine 100 mg  [facility] [place]

## 2018-11-03 DIAGNOSIS — Z3490 Encounter for supervision of normal pregnancy, unspecified, unspecified trimester: Secondary | ICD-10-CM | POA: Diagnosis not present

## 2018-11-04 ENCOUNTER — Other Ambulatory Visit: Payer: Self-pay | Admitting: Obstetrics and Gynecology

## 2018-11-04 DIAGNOSIS — Z363 Encounter for antenatal screening for malformations: Secondary | ICD-10-CM

## 2018-11-05 ENCOUNTER — Other Ambulatory Visit: Payer: Medicaid Other

## 2018-11-05 ENCOUNTER — Other Ambulatory Visit: Payer: Self-pay

## 2018-11-05 ENCOUNTER — Ambulatory Visit (INDEPENDENT_AMBULATORY_CARE_PROVIDER_SITE_OTHER): Payer: Medicaid Other | Admitting: Obstetrics and Gynecology

## 2018-11-05 ENCOUNTER — Ambulatory Visit (INDEPENDENT_AMBULATORY_CARE_PROVIDER_SITE_OTHER): Payer: Medicaid Other

## 2018-11-05 VITALS — BP 97/59 | HR 82 | Wt 183.6 lb

## 2018-11-05 DIAGNOSIS — Z363 Encounter for antenatal screening for malformations: Secondary | ICD-10-CM

## 2018-11-05 DIAGNOSIS — Z6791 Unspecified blood type, Rh negative: Secondary | ICD-10-CM

## 2018-11-05 DIAGNOSIS — Z3482 Encounter for supervision of other normal pregnancy, second trimester: Secondary | ICD-10-CM

## 2018-11-05 DIAGNOSIS — Z3A18 18 weeks gestation of pregnancy: Secondary | ICD-10-CM | POA: Diagnosis not present

## 2018-11-05 NOTE — Progress Notes (Signed)
[place] 18+6 wks,breech,cx 3.3 cm,posterior fundal placenta gr 0,svp of fluid 5.2 cm,fhr 148 bpm,EFW 252 g 35%,anatomy complete,no obvious abnormalities

## 2018-11-05 NOTE — Progress Notes (Signed)
Patient ID: [name] [name], female   DOB: April 18, 1990, 28 y.o.   MRN: [id number]    LOW-RISK PREGNANCY VISIT Patient name: [name] [name] MRN [id number]  Date of birth: Oct 08, 1990 Chief Complaint:   Routine Prenatal Visit  History of Present Illness:   [name] [name] is a 28 y.o. [id number] female at [redacted]w[redacted]d with an Estimated Date of Delivery: 04/02/19 being seen today for ongoing management of a low-risk pregnancy. Is doing well, wants a girl. Is taking prilosec to help with nausea.  Today she reports no complaints. Contractions: Not present. Vag. Bleeding: None.  Movement: Present. denies leaking of fluid. Review of Systems:   Pertinent items are noted in HPI Denies abnormal vaginal discharge w/ itching/odor/irritation, headaches, visual changes, shortness of breath, chest pain, abdominal pain, severe nausea/vomiting, or problems with urination or bowel movements unless otherwise stated above. Pertinent History Reviewed:  Reviewed past medical,surgical, social, obstetrical and family history.  Reviewed problem list, medications and allergies. Physical Assessment:   Vitals:   11/05/18 1206  BP: (!) 97/59  Pulse: 82  Weight: 183 lb 9.6 oz (83.3 kg)  Body mass index is 32.52 kg/m.        Physical Examination:   General appearance: Well appearing, and in no distress  Mental status: Alert, oriented to person, place, and time  Skin: Warm & dry  Cardiovascular: Normal heart rate noted  Respiratory: Normal respiratory effort, no distress  Abdomen: Soft, gravid, nontender  Pelvic: Cervical exam deferred         Extremities: Edema: None  Fetal Status: Fetal Heart Rate (bpm): 148 u/s   Movement: Present    No results found for this or any previous visit (from the past 24 hour(s)).  Assessment & Plan:  1) Low-risk pregnancy [id number] at [redacted]w[redacted]d with an Estimated Date of Delivery: 04/02/19   Meds: No orders of the defined types were placed in this encounter.  Labs/procedures today: [place] 18+6  wks,breech,cx 3.3 cm,posterior fundal placenta gr 0,svp of fluid 5.2 cm,fhr 148 bpm,EFW 252 g 35%,anatomy complete,no obvious abnormalities   Plan:   Continue routine obstetrical care Tubal consent form signed today  Reviewed: Preterm labor symptoms and general obstetric precautions including but not limited to vaginal bleeding, contractions, leaking of fluid and fetal movement were reviewed in detail with the patient.    Follow-up: Return in about 4 weeks (around 12/03/2018) for Televisit.  No orders of the defined types were placed in this encounter.  By signing my name below, I, Samul Dada, attest that this documentation has been prepared under the direction and in the presence of [name] Kind, MD. Electronically Signed: Ozan. 11/05/18. 12:21 PM.  I personally performed the services described in this documentation, which was SCRIBED in my presence. The recorded information has been reviewed and considered accurate. It has been edited as necessary during review. [name] Kind, MD

## 2018-11-29 ENCOUNTER — Encounter: Payer: Self-pay | Admitting: *Deleted

## 2018-12-02 ENCOUNTER — Encounter: Payer: Self-pay | Admitting: Women's Health

## 2018-12-02 ENCOUNTER — Telehealth (INDEPENDENT_AMBULATORY_CARE_PROVIDER_SITE_OTHER): Payer: Medicaid Other | Admitting: Women's Health

## 2018-12-02 ENCOUNTER — Other Ambulatory Visit: Payer: Self-pay

## 2018-12-02 VITALS — BP 101/85 | HR 101

## 2018-12-02 DIAGNOSIS — Z3A22 22 weeks gestation of pregnancy: Secondary | ICD-10-CM

## 2018-12-02 DIAGNOSIS — Z3482 Encounter for supervision of other normal pregnancy, second trimester: Secondary | ICD-10-CM

## 2018-12-02 NOTE — Progress Notes (Signed)
   TELEHEALTH VIRTUAL OBSTETRICS VISIT ENCOUNTER NOTE Patient name: [name] [name] MRN [id number]  Date of birth: 07-12-1990  I connected with patient on 12/02/18 at  9:10 AM EDT by MyChart video  and verified that I am speaking with the correct person using two identifiers. Due to COVID-19 recommendations, pt is not currently in our office.    I discussed the limitations, risks, security and privacy concerns of performing an evaluation and management service by telephone and the availability of in person appointments. I also discussed with the patient that there may be a patient responsible charge related to this service. The patient expressed understanding and agreed to proceed.  Chief Complaint:   Routine Prenatal Visit  History of Present Illness:   [name] [name] is a 28 y.o. [phone] female at [redacted]w[redacted]d with an Estimated Date of Delivery: 04/02/19 being evaluated today for ongoing management of a low-risk pregnancy.  Today she reports wants pelvic floor PT after baby born for SUI. Contractions: Not present. Vag. Bleeding: None.  Movement: Present. denies leaking of fluid. Review of Systems:   Pertinent items are noted in HPI Denies abnormal vaginal discharge w/ itching/odor/irritation, headaches, visual changes, shortness of breath, chest pain, abdominal pain, severe nausea/vomiting, or problems with urination or bowel movements unless otherwise stated above. Pertinent History Reviewed:  Reviewed past medical,surgical, social, obstetrical and family history.  Reviewed problem list, medications and allergies. Physical Assessment:   Vitals:   12/02/18 0927  BP: 101/85  Pulse: (!) 101  There is no height or weight on file to calculate BMI.        Physical Examination:   General:  Alert, oriented and cooperative.   Mental Status: Normal mood and affect perceived. Normal judgment and thought content.  Rest of physical exam deferred due to type of encounter  No results found for this or any  previous visit (from the past 24 hour(s)).  Assessment & Plan:  1) Pregnancy [id number] at [redacted]w[redacted]d with an Estimated Date of Delivery: 04/02/19   2) SUI, requests pp pelvic floor PT   Meds: No orders of the defined types were placed in this encounter.   Labs/procedures today: none  Plan:  Continue routine obstetrical care.  Has home bp cuff.  Check bp weekly, let us know if >140/90.  Next visit: prefers will be in person for pn2, tdap, flu shot    Reviewed: Preterm labor symptoms and general obstetric precautions including but not limited to vaginal bleeding, contractions, leaking of fluid and fetal movement were reviewed in detail with the patient. The patient was advised to call back or seek an in-person office evaluation/go to [facility] at [facility] for any urgent or concerning symptoms. All questions were answered. Please refer to After Visit Summary for other counseling recommendations.    I provided 15 minutes of non-face-to-face time during this encounter.  Follow-up: Return in about 4 weeks (around 12/30/2018) for LROB, PN2, in person, CNM.  No orders of the defined types were placed in this encounter.  [place], [facility] 12/02/2018 9:58 AM

## 2018-12-02 NOTE — Patient Instructions (Signed)
[name] [name], I greatly value your feedback.  If you receive a survey following your visit with [place] today, we appreciate you taking the time to fill it out.  Thanks, Knute Neu, CNM, WHNP-BC   You will have your sugar test next visit.  Please do not eat or drink anything after midnight the night before you come, not even water.  You will be here for at least two hours.  Please make an appointment online for the bloodwork at [email or web address] for 8:30am (or as close to this as possible). Make sure you select the [facility] service center. The day of the appointment, check in with our office first, then you will go to [place] to start the sugar test.    [place]!!! It is now [place] at [facility] ([place], [place] [address]) Entrance located off of [address] parking  Go to [email or web address] to register for FREE online childbirth classes   Call the office [phone]) or go to [facility] if:  You begin to have strong, frequent contractions  Your water breaks.  Sometimes it is a big gush of fluid, sometimes it is just a trickle that keeps getting your panties wet or running down your legs  You have vaginal bleeding.  It is normal to have a small amount of spotting if your cervix was checked.   You don't feel your baby moving like normal.  If you don't, get you something to eat and drink and lay down and focus on feeling your baby move.   If your baby is still not moving like normal, you should call the office or go to [place] [facility]Family Doctors:  [place] [phone]                 [place] [phone] (usually not accepting new patients unless you have family there already, you are always welcome to call and ask)       [facility] Department [phone]       [facility] Pediatricians/Family  Doctors:   [facility]: [phone]  [facility]: [phone]  [facility]: [place] Doctors:   [facility]: [facility]: [place]:  [place]: [phone]   Home Blood Pressure Monitoring for Patients   Your provider has recommended that you check your blood pressure (BP) at least once a week at home. If you do not have a blood pressure cuff at home, one will be provided for you. Contact your provider if you have not received your monitor within 1 week.   Helpful Tips for Accurate Home Blood Pressure Checks  . Don't smoke, exercise, or drink caffeine 30 minutes before checking your BP . Use the restroom before checking your BP (a full bladder can raise your pressure) . Relax in a comfortable upright chair . Feet on the ground . Left arm resting comfortably on a flat surface at the level of your heart . Legs uncrossed . Back supported . Sit quietly and don't talk . Place the cuff on your bare arm . Adjust snuggly, so that only two fingertips can fit between your skin and the top of the cuff . Check 2 readings separated by at least one minute . Keep a log of your BP readings .  For a visual, please reference this diagram: http://ccnc.care/bpdiagram  Provider Name: [facility]     Phone: [phone]  Zone 1: ALL CLEAR  Continue to monitor your symptoms:  . BP reading is less than 140 (top number) or less than 90 (bottom number)  . No right upper stomach pain . No headaches or seeing spots . No feeling nauseated or throwing up . No swelling in face and hands  Zone 2: CAUTION Call your doctor's office for any of the following:  . BP reading is greater than 140 (top number) or greater than 90 (bottom number)  . Stomach pain under your ribs in the middle or right side . Headaches or seeing spots . Feeling  nauseated or throwing up . Swelling in face and hands  Zone 3: EMERGENCY  Seek immediate medical care if you have any of the following:  . BP reading is greater than160 (top number) or greater than 110 (bottom number) . Severe headaches not improving with Tylenol . Serious difficulty catching your breath . Any worsening symptoms from Zone 2   Second Trimester of Pregnancy The second trimester is from week 13 through week 28, months 4 through 6. The second trimester is often a time when you feel your best. Your body has also adjusted to being pregnant, and you begin to feel better physically. Usually, morning sickness has lessened or quit completely, you may have more energy, and you may have an increase in appetite. The second trimester is also a time when the fetus is growing rapidly. At the end of the sixth month, the fetus is about 9 inches long and weighs about 1 pounds. You will likely begin to feel the baby move (quickening) between 18 and 20 weeks of the pregnancy. BODY CHANGES Your body goes through many changes during pregnancy. The changes vary from woman to woman.   Your weight will continue to increase. You will notice your lower abdomen bulging out.  You may begin to get stretch marks on your hips, abdomen, and breasts.  You may develop headaches that can be relieved by medicines approved by your health care provider.  You may urinate more often because the fetus is pressing on your bladder.  You may develop or continue to have heartburn as a result of your pregnancy.  You may develop constipation because certain hormones are causing the muscles that push waste through your intestines to slow down.  You may develop hemorrhoids or swollen, bulging veins (varicose veins).  You may have back pain because of the weight gain and pregnancy hormones relaxing your joints between the bones in your pelvis and as a result of a shift in weight and the muscles that support your  balance.  Your breasts will continue to grow and be tender.  Your gums may bleed and may be sensitive to brushing and flossing.  Dark spots or blotches (chloasma, mask of pregnancy) may develop on your face. This will likely fade after the baby is born.  A dark line from your belly button to the pubic area (linea nigra) may appear. This will likely fade after the baby is born.  You may have changes in your hair. These can include thickening of your hair, rapid growth, and changes in texture. Some women also have hair loss during or after pregnancy, or hair that feels dry or thin. Your hair will most likely return to normal after your baby is born. WHAT TO EXPECT AT YOUR PRENATAL VISITS During a routine  prenatal visit:  You will be weighed to make sure you and the fetus are growing normally.  Your blood pressure will be taken.  Your abdomen will be measured to track your baby's growth.  The fetal heartbeat will be listened to.  Any test results from the previous visit will be discussed. Your health care provider may ask you:  How you are feeling.  If you are feeling the baby move.  If you have had any abnormal symptoms, such as leaking fluid, bleeding, severe headaches, or abdominal cramping.  If you have any questions. Other tests that may be performed during your second trimester include:  Blood tests that check for:  Low iron levels (anemia).  Gestational diabetes (between 24 and 28 weeks).  Rh antibodies.  Urine tests to check for infections, diabetes, or protein in the urine.  An ultrasound to confirm the proper growth and development of the baby.  An amniocentesis to check for possible genetic problems.  Fetal screens for spina bifida and Down syndrome. HOME CARE INSTRUCTIONS   Avoid all smoking, herbs, alcohol, and unprescribed drugs. These chemicals affect the formation and growth of the baby.  Follow your health care provider's instructions regarding  medicine use. There are medicines that are either safe or unsafe to take during pregnancy.  Exercise only as directed by your health care provider. Experiencing uterine cramps is a good sign to stop exercising.  Continue to eat regular, healthy meals.  Wear a good support bra for breast tenderness.  Do not use hot tubs, steam rooms, or saunas.  Wear your seat belt at all times when driving.  Avoid raw meat, uncooked cheese, cat litter boxes, and soil used by cats. These carry germs that can cause birth defects in the baby.  Take your prenatal vitamins.  Try taking a stool softener (if your health care provider approves) if you develop constipation. Eat more high-fiber foods, such as fresh vegetables or fruit and whole grains. Drink plenty of fluids to keep your urine clear or pale yellow.  Take warm sitz baths to soothe any pain or discomfort caused by hemorrhoids. Use hemorrhoid cream if your health care provider approves.  If you develop varicose veins, wear support hose. Elevate your feet for 15 minutes, 3-4 times a day. Limit salt in your diet.  Avoid heavy lifting, wear low heel shoes, and practice good posture.  Rest with your legs elevated if you have leg cramps or low back pain.  Visit your dentist if you have not gone yet during your pregnancy. Use a soft toothbrush to brush your teeth and be gentle when you floss.  A sexual relationship may be continued unless your health care provider directs you otherwise.  Continue to go to all your prenatal visits as directed by your health care provider. SEEK MEDICAL CARE IF:   You have dizziness.  You have mild pelvic cramps, pelvic pressure, or nagging pain in the abdominal area.  You have persistent nausea, vomiting, or diarrhea.  You have a bad smelling vaginal discharge.  You have pain with urination. SEEK IMMEDIATE MEDICAL CARE IF:   You have a fever.  You are leaking fluid from your vagina.  You have spotting or  bleeding from your vagina.  You have severe abdominal cramping or pain.  You have rapid weight gain or loss.  You have shortness of breath with chest pain.  You notice sudden or extreme swelling of your face, hands, ankles, feet, or legs.  You have not  felt your baby move in over an hour.  You have severe headaches that do not go away with medicine.  You have vision changes. Document Released: 01/28/2001 Document Revised: 02/08/2013 Document Reviewed: 04/06/2012 [facility] Patient Information 2015 [place], [place]. This information is not intended to replace advice given to you by your health care provider. Make sure you discuss any questions you have with your health care provider.

## 2018-12-30 ENCOUNTER — Encounter: Payer: Self-pay | Admitting: Obstetrics & Gynecology

## 2018-12-30 ENCOUNTER — Other Ambulatory Visit: Payer: Medicaid Other

## 2018-12-30 ENCOUNTER — Ambulatory Visit (INDEPENDENT_AMBULATORY_CARE_PROVIDER_SITE_OTHER): Payer: Medicaid Other | Admitting: Obstetrics & Gynecology

## 2018-12-30 ENCOUNTER — Other Ambulatory Visit: Payer: Self-pay

## 2018-12-30 VITALS — BP 100/59 | HR 85 | Wt 196.0 lb

## 2018-12-30 DIAGNOSIS — Z3482 Encounter for supervision of other normal pregnancy, second trimester: Secondary | ICD-10-CM | POA: Diagnosis not present

## 2018-12-30 DIAGNOSIS — Z3A26 26 weeks gestation of pregnancy: Secondary | ICD-10-CM | POA: Diagnosis not present

## 2018-12-30 DIAGNOSIS — Z331 Pregnant state, incidental: Secondary | ICD-10-CM

## 2018-12-30 DIAGNOSIS — Z23 Encounter for immunization: Secondary | ICD-10-CM | POA: Diagnosis not present

## 2018-12-30 DIAGNOSIS — Z1389 Encounter for screening for other disorder: Secondary | ICD-10-CM

## 2018-12-30 LAB — POCT URINALYSIS DIPSTICK OB
Blood, UA: NEGATIVE
Glucose, UA: NEGATIVE
Ketones, UA: NEGATIVE
Leukocytes, UA: NEGATIVE
Nitrite, UA: NEGATIVE
POC,PROTEIN,UA: NEGATIVE

## 2018-12-30 NOTE — Progress Notes (Signed)
Patient ID: Casey Maxfield, female   DOB: 1990/11/12, 28 y.o.   MRN: [id number]   LOW-RISK PREGNANCY VISIT Patient name: Linsay Vogt MRN [id number]  Date of birth: 1991-02-15 Chief Complaint:   Routine Prenatal Visit (PN2)  History of Present Illness:   Kori Goins is a 28 y.o. [id number] female at [redacted]w[redacted]d with an Estimated Date of Delivery: 04/02/19 being seen today for ongoing management of a low-risk pregnancy.  Today she reports no complaints. Contractions: Not present. Vag. Bleeding: None.  Movement: Present. denies leaking of fluid. Review of Systems:   Pertinent items are noted in HPI Denies abnormal vaginal discharge w/ itching/odor/irritation, headaches, visual changes, shortness of breath, chest pain, abdominal pain, severe nausea/vomiting, or problems with urination or bowel movements unless otherwise stated above. Pertinent History Reviewed:  Reviewed past medical,surgical, social, obstetrical and family history.  Reviewed problem list, medications and allergies. Physical Assessment:   Vitals:   12/30/18 0912  BP: (!) 100/59  Pulse: 85  Weight: 196 lb (88.9 kg)  Body mass index is 34.72 kg/m.        Physical Examination:   General appearance: Well appearing, and in no distress  Mental status: Alert, oriented to person, place, and time  Skin: Warm & dry  Cardiovascular: Normal heart rate noted  Respiratory: Normal respiratory effort, no distress  Abdomen: Soft, gravid, nontender  Pelvic: Cervical exam deferred         Extremities: Edema: None  Fetal Status:     Movement: Present    Chaperone: n/a    No results found for this or any previous visit (from the past 24 hour(s)).  Assessment & Plan:  1) Low-risk pregnancy [id number] at [redacted]w[redacted]d with an Estimated Date of Delivery: 04/02/19   2) ,    Meds: No orders of the defined types were placed in this encounter.  Labs/procedures today: PN2  Plan:  Continue routine obstetrical care  Next visit: prefers in person     Reviewed: Preterm labor symptoms and general obstetric precautions including but not limited to vaginal bleeding, contractions, leaking of fluid and fetal movement were reviewed in detail with the patient.  All questions were answered.  home bp cuff. Rx faxed to . Check bp weekly, let us know if >140/90.   Follow-up: No follow-ups on file.  Orders Placed This Encounter  Procedures  . Flu Vaccine QUAD 36+ mos IM  . POC Urinalysis Dipstick OB   Florian Buff  07/03/2019 4:08 PM

## 2018-12-31 ENCOUNTER — Encounter: Payer: Self-pay | Admitting: Women's Health

## 2018-12-31 ENCOUNTER — Other Ambulatory Visit: Payer: Self-pay | Admitting: Women's Health

## 2018-12-31 DIAGNOSIS — O24410 Gestational diabetes mellitus in pregnancy, diet controlled: Secondary | ICD-10-CM | POA: Insufficient documentation

## 2018-12-31 DIAGNOSIS — Z8632 Personal history of gestational diabetes: Secondary | ICD-10-CM | POA: Insufficient documentation

## 2018-12-31 LAB — GLUCOSE TOLERANCE, 2 HOURS W/ 1HR
Glucose, 1 hour: 179 mg/dL (ref 65–179)
Glucose, 2 hour: 159 mg/dL — ABNORMAL HIGH (ref 65–152)
Glucose, Fasting: 94 mg/dL — ABNORMAL HIGH (ref 65–91)

## 2018-12-31 LAB — CBC
Hematocrit: 32.1 % — ABNORMAL LOW (ref 34.0–46.6)
Hemoglobin: 10.3 g/dL — ABNORMAL LOW (ref 11.1–15.9)
MCH: 24.1 pg — ABNORMAL LOW (ref 26.6–33.0)
MCHC: 32.1 g/dL (ref 31.5–35.7)
MCV: 75 fL — ABNORMAL LOW (ref 79–97)
Platelets: 193 10*3/uL (ref 150–450)
RBC: 4.27 x10E6/uL (ref 3.77–5.28)
RDW: 16.1 % — ABNORMAL HIGH (ref 11.7–15.4)
WBC: 12.7 10*3/uL — ABNORMAL HIGH (ref 3.4–10.8)

## 2018-12-31 LAB — HIV ANTIBODY (ROUTINE TESTING W REFLEX): HIV Screen 4th Generation wRfx: NONREACTIVE

## 2018-12-31 LAB — RPR: RPR Ser Ql: NONREACTIVE

## 2018-12-31 LAB — ANTIBODY SCREEN: Antibody Screen: NEGATIVE

## 2018-12-31 MED ORDER — FERROUS SULFATE 325 (65 FE) MG PO TABS: 325.0000 mg | ORAL_TABLET | Freq: Two times a day (BID) | ORAL | 3 refills | Status: DC

## 2019-01-03 ENCOUNTER — Other Ambulatory Visit: Payer: Self-pay | Admitting: *Deleted

## 2019-01-03 DIAGNOSIS — Z3A27 27 weeks gestation of pregnancy: Secondary | ICD-10-CM

## 2019-01-03 DIAGNOSIS — O24410 Gestational diabetes mellitus in pregnancy, diet controlled: Secondary | ICD-10-CM

## 2019-01-03 DIAGNOSIS — O0990 Supervision of high risk pregnancy, unspecified, unspecified trimester: Secondary | ICD-10-CM

## 2019-01-03 MED ORDER — ACCU-CHEK GUIDE ME W/DEVICE KIT: 1.0000 | PACK | Freq: Four times a day (QID) | 0 refills | Status: DC

## 2019-01-03 MED ORDER — ACCU-CHEK FASTCLIX LANCETS MISC: 1.0000 | Freq: Four times a day (QID) | 12 refills | Status: DC

## 2019-01-03 MED ORDER — ACCU-CHEK GUIDE VI STRP: ORAL_STRIP | 12 refills | Status: DC

## 2019-01-05 ENCOUNTER — Telehealth: Payer: Self-pay | Admitting: *Deleted

## 2019-01-05 NOTE — Telephone Encounter (Signed)
LMOVM requesting patient return my call regarding sugar test.

## 2019-01-06 ENCOUNTER — Telehealth: Payer: Self-pay | Admitting: *Deleted

## 2019-01-06 NOTE — Telephone Encounter (Signed)
Left voicemail on home number for patient to return my call regarding her lab work. No voicemail set up on mobile number.

## 2019-01-20 ENCOUNTER — Encounter: Payer: Medicaid Other | Attending: Obstetrics & Gynecology | Admitting: *Deleted

## 2019-01-20 ENCOUNTER — Other Ambulatory Visit: Payer: Self-pay

## 2019-01-20 ENCOUNTER — Ambulatory Visit: Payer: Medicaid Other | Admitting: *Deleted

## 2019-01-20 NOTE — Progress Notes (Signed)
This visit was completed via MyChart due to the COVID-19 pandemic.   I spoke with [name] [name] and verified that I was speaking with the correct person with two patient identifiers (full name and date of birth).   I discussed the limitations related to this kind of visit and the patient is willing to proceed.   Patient was seen virtually on 01/20/2019 for Gestational Diabetes self-management. EDD 04/02/2019. Patient states no history of GDM with last two pregnancies but both parents have Diabetes. She states she has not picked up her meter yet but is familiar with blood sugar testing by watching her parents.. Diet history obtained. Patient eats poor to fair variety of all food groups stating she does not cook, their stove isn't working so all meals are prepared in [employer] for now and she does not like any vegetables. So she eats convenience type foods primarily. She does enjoy fresh fruit as a snack regularly. . Beverages include water, V-8 Splash and occasionally regular Ginger ale if she is nauseated.  The following learning objectives were met by the patient :   States the definition of Gestational Diabetes  States why dietary management is important in controlling blood glucose  Describes the effects of carbohydrates on blood glucose levels  Demonstrates ability to create a balanced meal plan  Demonstrates carbohydrate counting   States when to check blood glucose levels  Demonstrates proper blood glucose monitoring techniques  States the effect of stress and exercise on blood glucose levels  States the importance of limiting caffeine and abstaining from alcohol and smoking  Plan:  Continue to spread carbohydrate foods fairly evenly throughoutt the day Consider reading food labels for Total Carbohydrate of foods If OK with your MD, consider  increasing your activity level by walking, Arm Chair Exercises or other activity daily as tolerated Begin checking BG before breakfast and  2 hours after first bite of breakfast, lunch and dinner as directed by MD  Bring Log Book/Sheet and meter to every medical appointment OR use Baby Scripts (see below) Baby Scripts:  Patient was introduced to [employer] and plans to use as record of BG electronically  Take medication if directed by MD  Blood glucose monitor Rx called into pharmacy prior to this appointment but she has not picked it up yet. I provided her with [id number] to ensure she is not charged for the meter with Medicaid coverage:  Accu Check Guide with Fast Clix drums OR Softclix lancets Patient declined meter instruction today as she is familiar with testing blood sugars already. I did instruct her to test pre breakfast and 2 hours each meal as directed by MD  Patient instructed to monitor glucose levels: FBS: 60 - 95 mg/dl 2 hour: <120 mg/dl  Patient received the following handouts:emailed to patient due to virtual visit  Nutrition Diabetes and Pregnancy  Carbohydrate Counting List  Patient will be seen for follow-up as needed.

## 2019-01-24 ENCOUNTER — Encounter: Payer: Self-pay | Admitting: *Deleted

## 2019-01-26 ENCOUNTER — Other Ambulatory Visit: Payer: Self-pay | Admitting: *Deleted

## 2019-01-26 DIAGNOSIS — O24419 Gestational diabetes mellitus in pregnancy, unspecified control: Secondary | ICD-10-CM

## 2019-01-27 ENCOUNTER — Other Ambulatory Visit: Payer: Self-pay

## 2019-01-27 ENCOUNTER — Encounter: Payer: Self-pay | Admitting: Women's Health

## 2019-01-27 ENCOUNTER — Ambulatory Visit (INDEPENDENT_AMBULATORY_CARE_PROVIDER_SITE_OTHER): Payer: Medicaid Other | Admitting: Women's Health

## 2019-01-27 VITALS — BP 110/63 | HR 87 | Wt 192.8 lb

## 2019-01-27 DIAGNOSIS — Z331 Pregnant state, incidental: Secondary | ICD-10-CM

## 2019-01-27 DIAGNOSIS — O0993 Supervision of high risk pregnancy, unspecified, third trimester: Secondary | ICD-10-CM

## 2019-01-27 DIAGNOSIS — O0990 Supervision of high risk pregnancy, unspecified, unspecified trimester: Secondary | ICD-10-CM

## 2019-01-27 DIAGNOSIS — Z23 Encounter for immunization: Secondary | ICD-10-CM | POA: Diagnosis not present

## 2019-01-27 DIAGNOSIS — O360130 Maternal care for anti-D [Rh] antibodies, third trimester, not applicable or unspecified: Secondary | ICD-10-CM

## 2019-01-27 DIAGNOSIS — Z6791 Unspecified blood type, Rh negative: Secondary | ICD-10-CM

## 2019-01-27 DIAGNOSIS — Z1389 Encounter for screening for other disorder: Secondary | ICD-10-CM

## 2019-01-27 DIAGNOSIS — O24410 Gestational diabetes mellitus in pregnancy, diet controlled: Secondary | ICD-10-CM

## 2019-01-27 DIAGNOSIS — Z3A30 30 weeks gestation of pregnancy: Secondary | ICD-10-CM

## 2019-01-27 LAB — POCT URINALYSIS DIPSTICK OB
Blood, UA: NEGATIVE
Glucose, UA: NEGATIVE
Ketones, UA: NEGATIVE
Leukocytes, UA: NEGATIVE
Nitrite, UA: NEGATIVE
POC,PROTEIN,UA: NEGATIVE

## 2019-01-27 MED ORDER — RHO D IMMUNE GLOBULIN 1500 UNIT/2ML IJ SOSY
300.0000 ug | PREFILLED_SYRINGE | Freq: Once | INTRAMUSCULAR | Status: AC
  Administered 2019-01-27: 09:00:00 300 ug via INTRAMUSCULAR

## 2019-01-27 NOTE — Addendum Note (Signed)
Addended by: Diona Fanti A on: 01/27/2019 10:13 AM   Modules accepted: Orders

## 2019-01-27 NOTE — Progress Notes (Signed)
   HIGH-RISK PREGNANCY VISIT Patient name: [name] [name] MRN [id number]  Date of birth: 1990/06/01 Chief Complaint:   High Risk Gestation  History of Present Illness:   [name] [name] is a 28 y.o. [id number] female at [redacted]w[redacted]d with an Estimated Date of Delivery: 04/02/19 being seen today for ongoing management of a high-risk pregnancy complicated by diabetes mellitus A1DM.  Today she reports hasn't started checking sugars yet, [facility] hasn't gotten her supplies in yet. Was drinking 5 cartons of egg nog weekly prior to sugar test, she feels this may have had something to do w/ abnormal results. Has since quit. Wants BTL vs Nexplanon.  Contractions: Not present.  .  Movement: Present. denies leaking of fluid.  Review of Systems:   Pertinent items are noted in HPI Denies abnormal vaginal discharge w/ itching/odor/irritation, headaches, visual changes, shortness of breath, chest pain, abdominal pain, severe nausea/vomiting, or problems with urination or bowel movements unless otherwise stated above. Pertinent History Reviewed:  Reviewed past medical,surgical, social, obstetrical and family history.  Reviewed problem list, medications and allergies. Physical Assessment:   Vitals:   01/27/19 0903  BP: 110/63  Pulse: 87  Weight: 192 lb 12.8 oz (87.5 kg)  Body mass index is 34.15 kg/m.           Physical Examination:   General appearance: alert, well appearing, and in no distress  Mental status: alert, oriented to person, place, and time  Skin: warm & dry   Extremities: Edema: None    Cardiovascular: normal heart rate noted  Respiratory: normal respiratory effort, no distress  Abdomen: gravid, soft, non-tender  Pelvic: Cervical exam deferred         Fetal Status: Fetal Heart Rate (bpm): 137 Fundal Height: 31 cm Movement: Present    Fetal Surveillance Testing today: doppler   Chaperone: n/a    No results found for this or any previous visit (from the past 24 hour(s)).  Assessment & Plan:  1)  High-risk pregnancy [id number] at [redacted]w[redacted]d with an Estimated Date of Delivery: 04/02/19   2) A1DM, hasn't started checking sugars yet, to call [facility] today to see how much longer, if doesn't have supplies by Monday, switch rx to another pharmacy and start QID testing ASAP  3) Wants BTL, reviewed risks/benefits, LARCs just as effective, consent signed today   Meds:  Meds ordered this encounter  Medications  . rho (d) immune globulin (RHIG/RHOPHYLAC) injection 300 mcg    Labs/procedures today: tdap, rhophylac  Treatment Plan:  Growth u/s @ 36wk     Deliver @ 39-40wks:____   Reviewed: Preterm labor symptoms and general obstetric precautions including but not limited to vaginal bleeding, contractions, leaking of fluid and fetal movement were reviewed in detail with the patient.  All questions were answered. Has home bp cuff. Check bp weekly, let us know if >140/90.   Follow-up: Return in about 2 weeks (around 02/10/2019) for HROB, MyChart Video, MD or CNM, Sign BTL consent today.  Orders Placed This Encounter  Procedures  . Tdap vaccine greater than or equal to 7yo IM   Roma Schanz CNM, [facility] 01/27/2019 9:47 AM

## 2019-01-27 NOTE — Patient Instructions (Signed)
[name] [name], I greatly value your feedback.  If you receive a survey following your visit with [place] today, we appreciate you taking the time to fill it out.  Thanks, Knute Neu, CNM, [facility]  [place]!!! It is now [place] [facility] ([place], [place] [address]) Entrance located off of [address] parking   Go to [email or web address] to register for FREE online childbirth classes    Call the office [phone]) or go to [facility] if:  You begin to have strong, frequent contractions  Your water breaks.  Sometimes it is a big gush of fluid, sometimes it is just a trickle that keeps getting your panties wet or running down your legs  You have vaginal bleeding.  It is normal to have a small amount of spotting if your cervix was checked.   You don't feel your baby moving like normal.  If you don't, get you something to eat and drink and lay down and focus on feeling your baby move.  You should feel at least 10 movements in 2 hours.  If you don't, you should call the office or go to [place] Blood Pressure Monitoring for Patients   Your provider has recommended that you check your blood pressure (BP) at least once a week at home. If you do not have a blood pressure cuff at home, one will be provided for you. Contact your provider if you have not received your monitor within 1 week.   Helpful Tips for Accurate Home Blood Pressure Checks  . Don't smoke, exercise, or drink caffeine 30 minutes before checking your BP . Use the restroom before checking your BP (a full bladder can raise your pressure) . Relax in a comfortable upright chair . Feet on the ground . Left arm resting comfortably on a flat surface at the level of your heart . Legs uncrossed . Back supported . Sit quietly and don't talk . Place the cuff on your bare arm . Adjust snuggly, so that only two fingertips can fit between your skin and  the top of the cuff . Check 2 readings separated by at least one minute . Keep a log of your BP readings . For a visual, please reference this diagram: http://ccnc.care/bpdiagram  Provider Name: [facility]     Phone: [phone]  Zone 1: ALL CLEAR  Continue to monitor your symptoms:  . BP reading is less than 140 (top number) or less than 90 (bottom number)  . No right upper stomach pain . No headaches or seeing spots . No feeling nauseated or throwing up . No swelling in face and hands  Zone 2: CAUTION Call your doctor's office for any of the following:  . BP reading is greater than 140 (top number) or greater than 90 (bottom number)  . Stomach pain under your ribs in the middle or right side . Headaches or seeing spots . Feeling nauseated or throwing up . Swelling in face and hands  Zone 3: EMERGENCY  Seek immediate medical care if you have any of the following:  . BP reading is greater than160 (top number) or greater than 110 (bottom number) . Severe headaches not improving with Tylenol . Serious difficulty catching your breath . Any worsening symptoms from Zone 2   Preterm Labor and Birth Information  The normal length of a pregnancy is 39-41 weeks. Preterm labor is when labor starts before 57  completed weeks of pregnancy. What are the risk factors for preterm labor? Preterm labor is more likely to occur in women who:  Have certain infections during pregnancy such as a bladder infection, sexually transmitted infection, or infection inside the uterus (chorioamnionitis).  Have a shorter-than-normal cervix.  Have gone into preterm labor before.  Have had surgery on their cervix.  Are younger than age 39 or older than age 32.  Are African American.  Are pregnant with twins or multiple babies (multiple gestation).  Take street drugs or smoke while pregnant.  Do not gain enough weight while pregnant.  Became pregnant shortly after having been pregnant.  What are the symptoms of preterm labor? Symptoms of preterm labor include:  Cramps similar to those that can happen during a menstrual period. The cramps may happen with diarrhea.  Pain in the abdomen or lower back.  Regular uterine contractions that may feel like tightening of the abdomen.  A feeling of increased pressure in the pelvis.  Increased watery or bloody mucus discharge from the vagina.  Water breaking (ruptured amniotic sac). Why is it important to recognize signs of preterm labor? It is important to recognize signs of preterm labor because babies who are born prematurely may not be fully developed. This can put them at an increased risk for:  Long-term (chronic) heart and lung problems.  Difficulty immediately after birth with regulating body systems, including blood sugar, body temperature, heart rate, and breathing rate.  Bleeding in the brain.  Cerebral palsy.  Learning difficulties.  Death. These risks are highest for babies who are born before 35 weeks of pregnancy. How is preterm labor treated? Treatment depends on the length of your pregnancy, your condition, and the health of your baby. It may involve:  Having a stitch (suture) placed in your cervix to prevent your cervix from opening too early (cerclage).  Taking or being given medicines, such as: ? Hormone medicines. These may be given early in pregnancy to help support the pregnancy. ? Medicine to stop contractions. ? Medicines to help mature the baby's lungs. These may be prescribed if the risk of delivery is high. ? Medicines to prevent your baby from developing cerebral palsy. If the labor happens before 34 weeks of pregnancy, you may need to stay in the hospital. What should I do if I think I am in preterm labor? If you think that you are going into preterm labor, call your health care provider right away. How can I prevent preterm labor in future pregnancies? To increase your chance of having a  full-term pregnancy:  Do not use any tobacco products, such as cigarettes, chewing tobacco, and e-cigarettes. If you need help quitting, ask your health care provider.  Do not use street drugs or medicines that have not been prescribed to you during your pregnancy.  Talk with your health care provider before taking any herbal supplements, even if you have been taking them regularly.  Make sure you gain a healthy amount of weight during your pregnancy.  Watch for infection. If you think that you might have an infection, get it checked right away.  Make sure to tell your health care provider if you have gone into preterm labor before. This information is not intended to replace advice given to you by your health care provider. Make sure you discuss any questions you have with your health care provider. Document Released: 04/26/2003 Document Revised: 05/28/2018 Document Reviewed: 06/27/2015 Elsevier Patient Education  2020 [employer].

## 2019-02-09 ENCOUNTER — Telehealth (INDEPENDENT_AMBULATORY_CARE_PROVIDER_SITE_OTHER): Payer: Medicaid Other | Admitting: Advanced Practice Midwife

## 2019-02-09 ENCOUNTER — Other Ambulatory Visit: Payer: Self-pay

## 2019-02-09 ENCOUNTER — Encounter: Payer: Self-pay | Admitting: Advanced Practice Midwife

## 2019-02-09 DIAGNOSIS — O0993 Supervision of high risk pregnancy, unspecified, third trimester: Secondary | ICD-10-CM

## 2019-02-09 DIAGNOSIS — O0990 Supervision of high risk pregnancy, unspecified, unspecified trimester: Secondary | ICD-10-CM

## 2019-02-09 DIAGNOSIS — Z302 Encounter for sterilization: Secondary | ICD-10-CM | POA: Insufficient documentation

## 2019-02-09 DIAGNOSIS — O24410 Gestational diabetes mellitus in pregnancy, diet controlled: Secondary | ICD-10-CM

## 2019-02-09 DIAGNOSIS — Z3A32 32 weeks gestation of pregnancy: Secondary | ICD-10-CM

## 2019-02-09 NOTE — Progress Notes (Signed)
   TELEHEALTH VIRTUAL OBSTETRICS VISIT ENCOUNTER NOTE Patient name: [name] [name] MRN [id number]  Date of birth: [birth year]  I connected with patient on 02/09/19 at  9:30 AM EST by My Chart and verified that I am speaking with the correct person using two identifiers. Due to COVID-19 recommendations, pt is not currently in our office.    I discussed the limitations, risks, security and privacy concerns of performing an evaluation and management service by telephone and the availability of in person appointments. I also discussed with the patient that there may be a patient responsible charge related to this service. The patient expressed understanding and agreed to proceed.  Chief Complaint:   No chief complaint on file.  History of Present Illness:   [name] [name] is a 28 y.o. [phone] female at [redacted]w[redacted]d with an Estimated Date of Delivery: 04/02/19 being evaluated today for ongoing management of a high-risk pregnancy due to [place].  Today she reports very erratic eating patterns due to recent death in the family, sadness. As a result she hasn't been in a good pattern of checking CBGs. Did have diabetic teaching. Has some fasting values in the high 90s, no postprandial values..  .  .   . denies leaking of fluid. Review of Systems:   Pertinent items are noted in HPI Denies abnormal vaginal discharge w/ itching/odor/irritation, headaches, visual changes, shortness of breath, chest pain, abdominal pain, severe nausea/vomiting, or problems with urination or bowel movements unless otherwise stated above. Pertinent History Reviewed:  Reviewed past medical,surgical, social, obstetrical and family history.  Reviewed problem list, medications and allergies. Physical Assessment:  There were no vitals filed for this visit.There is no height or weight on file to calculate BMI.        Physical Examination:   General:  Alert, oriented and cooperative.   Mental Status: Normal mood and affect perceived. Normal  judgment and thought content.  Rest of physical exam deferred due to type of encounter  No results found for this or any previous visit (from the past 24 hour(s)).  Assessment & Plan:  1) Pregnancy [id number] at [redacted]w[redacted]d with an Estimated Date of Delivery: 04/02/19   2) GDMA1, will work on reg eating pattern and checking of CBGs this week; needs growth scan at next visit  3) Plans on BTL, papers signed  4) Bipolar, doing well on meds (lexapro & lamictal)  5) Rh neg, rec'd Rhogam 01/27/28   Meds: No orders of the defined types were placed in this encounter.   Labs/procedures today: none  Plan:  Continue routine obstetrical care including growth scan at next visit. Has home bp cuff. Check bp weekly, let us know if >140/90.  Next visit: prefers will be in person for U/S    Reviewed: Preterm labor symptoms and general obstetric precautions including but not limited to vaginal bleeding, contractions, leaking of fluid and fetal movement were reviewed in detail with the patient. The patient was advised to call back or seek an in-person office evaluation/go to [facility] at [facility] for any urgent or concerning symptoms. All questions were answered. Please refer to After Visit Summary for other counseling recommendations.    I provided 15 minutes of non-face-to-face time during this encounter.  Follow-up: No follow-ups on file.  Orders Placed This Encounter  Procedures  . US OB Follow Up   Myrtis Ser CNM 02/09/2019 11:19 AM

## 2019-02-18 NOTE — L&D Delivery Note (Signed)
LABOR COURSE Patient presented in active labor and was noted to be 7.5cm in MAU. She SROMd meconium stained fluid soon after her arrival on L&D and delivered shortly thereafter.  Delivery Note Called to room and patient was complete and pushing. Head delivered LOT. No nuchal cord present. Shoulder and body delivered in usual fashion. At 1113 a viable female was delivered via Vaginal, Spontaneous (Presentation:LOT ; LOA ).  Infant with spontaneous cry, placed on mother's abdomen, dried and stimulated. Cord clamped x 2 after one-minute delay, and cut by FOB. Cord blood drawn. Placenta delivered spontaneously with gentle cord traction. Appears intact. Fundus firm with massage and Pitocin. Labia, perineum, vagina, and cervix inspected with no lacerations noted.   APGAR: 8,9; weight  .   Cord: 3VC with the following complications:none.   Cord pH: collection not indicated  Anesthesia:  N/A. Patient given IV Fentanyl immediately after delivery per her request Episiotomy: None Lacerations: None Q. Blood Loss (mL): 50  Mom to OR for tubal.  Baby to Couplet care / Skin to Skin.  Message sent to clinic to coordinate postpartum appointment.  Clayton Bibles, CNM 04/01/19 12:36 PM

## 2019-02-25 ENCOUNTER — Ambulatory Visit (INDEPENDENT_AMBULATORY_CARE_PROVIDER_SITE_OTHER): Payer: Medicaid Other | Admitting: Obstetrics & Gynecology

## 2019-02-25 ENCOUNTER — Ambulatory Visit (INDEPENDENT_AMBULATORY_CARE_PROVIDER_SITE_OTHER): Payer: Medicaid Other

## 2019-02-25 ENCOUNTER — Encounter: Payer: Self-pay | Admitting: Obstetrics & Gynecology

## 2019-02-25 ENCOUNTER — Other Ambulatory Visit: Payer: Self-pay

## 2019-02-25 VITALS — BP 108/68 | HR 81 | Wt 193.0 lb

## 2019-02-25 DIAGNOSIS — Z1389 Encounter for screening for other disorder: Secondary | ICD-10-CM

## 2019-02-25 DIAGNOSIS — O0990 Supervision of high risk pregnancy, unspecified, unspecified trimester: Secondary | ICD-10-CM

## 2019-02-25 DIAGNOSIS — Z3A34 34 weeks gestation of pregnancy: Secondary | ICD-10-CM | POA: Diagnosis not present

## 2019-02-25 DIAGNOSIS — Z331 Pregnant state, incidental: Secondary | ICD-10-CM

## 2019-02-25 DIAGNOSIS — Z362 Encounter for other antenatal screening follow-up: Secondary | ICD-10-CM

## 2019-02-25 DIAGNOSIS — O0993 Supervision of high risk pregnancy, unspecified, third trimester: Secondary | ICD-10-CM

## 2019-02-25 DIAGNOSIS — O24410 Gestational diabetes mellitus in pregnancy, diet controlled: Secondary | ICD-10-CM

## 2019-02-25 LAB — POCT URINALYSIS DIPSTICK OB
Glucose, UA: NEGATIVE
Ketones, UA: NEGATIVE
Nitrite, UA: NEGATIVE
POC,PROTEIN,UA: NEGATIVE

## 2019-02-25 NOTE — Progress Notes (Signed)
   HIGH-RISK PREGNANCY VISIT Patient name: [name] [name] MRN [id number]  Date of birth: 22-Jul-1990 Chief Complaint:   High Risk Gestation ([place] today)  History of Present Illness:   [name] [name] is a 29 y.o. [phone] female at [redacted]w[redacted]d with an Estimated Date of Delivery: 04/02/19 being seen today for ongoing management of a high-risk pregnancy complicated by diabetes mellitus A1DM.  Today she reports no complaints. Contractions: Not present. Vag. Bleeding: None.  Movement: Present. denies leaking of fluid.  Review of Systems:   Pertinent items are noted in HPI Denies abnormal vaginal discharge w/ itching/odor/irritation, headaches, visual changes, shortness of breath, chest pain, abdominal pain, severe nausea/vomiting, or problems with urination or bowel movements unless otherwise stated above. Pertinent History Reviewed:  Reviewed past medical,surgical, social, obstetrical and family history.  Reviewed problem list, medications and allergies. Physical Assessment:   Vitals:   02/25/19 1112  BP: 108/68  Pulse: 81  Weight: 193 lb (87.5 kg)  Body mass index is 34.19 kg/m.           Physical Examination:   General appearance: alert, well appearing, and in no distress  Mental status: alert, oriented to person, place, and time  Skin: warm & dry   Extremities: Edema: Trace    Cardiovascular: normal heart rate noted  Respiratory: normal respiratory effort, no distress  Abdomen: gravid, soft, non-tender  Pelvic: Cervical exam deferred         Fetal Status:   Fundal Height: 33152 cm Movement: Present    Fetal Surveillance Testing today: sonogram   Chaperone: n/a    Results for orders placed or performed in visit on 02/25/19 (from the past 24 hour(s))  POC Urinalysis Dipstick OB   Collection Time: 02/25/19 11:15 AM  Result Value Ref Range   Color, UA     Clarity, UA     Glucose, UA Negative Negative   Bilirubin, UA     Ketones, UA neg    Spec Grav, UA     Blood, UA trace    pH, UA      POC,PROTEIN,UA Negative Negative, Trace, Small (1+), Moderate (2+), Large (3+), 4+   Urobilinogen, UA     Nitrite, UA neg    Leukocytes, UA Trace (A) Negative   Appearance     Odor      Assessment & Plan:  1) High-risk pregnancy [id number] at [redacted]w[redacted]d with an Estimated Date of Delivery: 04/02/19   2) Class A1 DM, stable, CBG are reasonable not perfect, but EFW 70%    Meds: No orders of the defined types were placed in this encounter.   Labs/procedures today: sonogram  Treatment Plan:  IOL 40 weeks if undelivered  Reviewed: Term labor symptoms and general obstetric precautions including but not limited to vaginal bleeding, contractions, leaking of fluid and fetal movement were reviewed in detail with the patient.  All questions were answered. Has home bp cuff. Rx faxed to . Check bp weekly, let us know if >140/90.   Follow-up: Return in about 2 weeks (around 03/11/2019) for HROB.  Orders Placed This Encounter  Procedures  . POC Urinalysis Dipstick OB   Lazaro Arms  02/25/2019 11:37 AM

## 2019-02-25 NOTE — Progress Notes (Signed)
[place] 34+6 wks,cephalic,posterior placenta gr 1,fhr 152 bpm,afi 15.6 cm,efw 2744 g 70%

## 2019-03-11 ENCOUNTER — Ambulatory Visit (INDEPENDENT_AMBULATORY_CARE_PROVIDER_SITE_OTHER): Payer: Medicaid Other | Admitting: Obstetrics & Gynecology

## 2019-03-11 ENCOUNTER — Other Ambulatory Visit (HOSPITAL_COMMUNITY)
Admission: RE | Admit: 2019-03-11 | Discharge: 2019-03-11 | Disposition: A | Discharge status: Home / Self Care | Payer: Medicaid Other | Source: Ambulatory Visit | Attending: Obstetrics & Gynecology | Admitting: Obstetrics & Gynecology

## 2019-03-11 ENCOUNTER — Other Ambulatory Visit: Payer: Self-pay

## 2019-03-11 VITALS — BP 108/67 | HR 87 | Wt 195.0 lb

## 2019-03-11 DIAGNOSIS — Z3A36 36 weeks gestation of pregnancy: Secondary | ICD-10-CM | POA: Diagnosis not present

## 2019-03-11 DIAGNOSIS — O0990 Supervision of high risk pregnancy, unspecified, unspecified trimester: Secondary | ICD-10-CM

## 2019-03-11 DIAGNOSIS — O0993 Supervision of high risk pregnancy, unspecified, third trimester: Secondary | ICD-10-CM

## 2019-03-11 DIAGNOSIS — O99891 Other specified diseases and conditions complicating pregnancy: Secondary | ICD-10-CM | POA: Insufficient documentation

## 2019-03-11 DIAGNOSIS — O24410 Gestational diabetes mellitus in pregnancy, diet controlled: Secondary | ICD-10-CM

## 2019-03-11 DIAGNOSIS — Z6791 Unspecified blood type, Rh negative: Secondary | ICD-10-CM

## 2019-03-11 DIAGNOSIS — N393 Stress incontinence (female) (male): Secondary | ICD-10-CM | POA: Insufficient documentation

## 2019-03-11 DIAGNOSIS — O26899 Other specified pregnancy related conditions, unspecified trimester: Secondary | ICD-10-CM

## 2019-03-11 NOTE — Progress Notes (Signed)
   HIGH-RISK PREGNANCY VISIT Patient name: [name] [name] MRN [id number]  Date of birth: 1990/12/13 Chief Complaint:   Routine Prenatal Visit  History of Present Illness:   [name] [name] is a 29 y.o. [id number] female at [redacted]w[redacted]d with an Estimated Date of Delivery: 04/02/19 being seen today for ongoing management of a high-risk pregnancy complicated by diabetes mellitus A1DM.  Today she reports no complaints. Contractions: Not present. Vag. Bleeding: None.  Movement: Present. denies leaking of fluid.  Review of Systems:   Pertinent items are noted in HPI Denies abnormal vaginal discharge w/ itching/odor/irritation, headaches, visual changes, shortness of breath, chest pain, abdominal pain, severe nausea/vomiting, or problems with urination or bowel movements unless otherwise stated above. Pertinent History Reviewed:  Reviewed past medical,surgical, social, obstetrical and family history.  Reviewed problem list, medications and allergies. Physical Assessment:   Vitals:   03/11/19 0836  BP: 108/67  Pulse: 87  Weight: 195 lb (88.5 kg)  Body mass index is 34.54 kg/m.           Physical Examination:   General appearance: alert, well appearing, and in no distress  Mental status: alert, oriented to person, place, and time  Skin: warm & dry   Extremities: Edema: Trace    Cardiovascular: normal heart rate noted  Respiratory: normal respiratory effort, no distress  Abdomen: gravid, soft, non-tender  Pelvic: Cervical exam performed  Dilation: 1 Effacement (%): Thick Station: -3  Fetal Status: Fetal Heart Rate (bpm): 142 Fundal Height: 38 cm Movement: Present Presentation: Vertex  Fetal Surveillance Testing today: FHR   Chaperone: Latisha Cresenzo    No results found for this or any previous visit (from the past 24 hour(s)).  Assessment & Plan:  1) High-risk pregnancy [id number] at [redacted]w[redacted]d with an Estimated Date of Delivery: 04/02/19   2) Class A1 DM, stable, EFW 70% all CBG are good    Meds:  No orders of the defined types were placed in this encounter.   Labs/procedures today:   Treatment Plan:  IOL 40 weeks  Reviewed: Term labor symptoms and general obstetric precautions including but not limited to vaginal bleeding, contractions, leaking of fluid and fetal movement were reviewed in detail with the patient.  All questions were answered. Has home bp cuff. Rx faxed to . Check bp weekly, let us know if >140/90.   Follow-up: Return in about 1 week (around 03/18/2019) for HROB.  Orders Placed This Encounter  Procedures  . Culture, beta strep (group b only)   Lazaro Arms  03/11/2019 9:16 AM

## 2019-03-14 LAB — CERVICOVAGINAL ANCILLARY ONLY
Chlamydia: NEGATIVE
Comment: NEGATIVE
Comment: NORMAL
Neisseria Gonorrhea: NEGATIVE

## 2019-03-15 LAB — CULTURE, BETA STREP (GROUP B ONLY): Strep Gp B Culture: NEGATIVE

## 2019-03-18 ENCOUNTER — Encounter: Payer: Self-pay | Admitting: Obstetrics and Gynecology

## 2019-03-18 ENCOUNTER — Ambulatory Visit (INDEPENDENT_AMBULATORY_CARE_PROVIDER_SITE_OTHER): Payer: Medicaid Other | Admitting: Obstetrics and Gynecology

## 2019-03-18 ENCOUNTER — Other Ambulatory Visit: Payer: Self-pay

## 2019-03-18 VITALS — BP 114/77 | HR 98 | Wt 193.0 lb

## 2019-03-18 DIAGNOSIS — O26899 Other specified pregnancy related conditions, unspecified trimester: Secondary | ICD-10-CM

## 2019-03-18 DIAGNOSIS — Z1389 Encounter for screening for other disorder: Secondary | ICD-10-CM

## 2019-03-18 DIAGNOSIS — Z302 Encounter for sterilization: Secondary | ICD-10-CM

## 2019-03-18 DIAGNOSIS — O24410 Gestational diabetes mellitus in pregnancy, diet controlled: Secondary | ICD-10-CM

## 2019-03-18 DIAGNOSIS — Z3A37 37 weeks gestation of pregnancy: Secondary | ICD-10-CM

## 2019-03-18 DIAGNOSIS — O0990 Supervision of high risk pregnancy, unspecified, unspecified trimester: Secondary | ICD-10-CM

## 2019-03-18 DIAGNOSIS — Z331 Pregnant state, incidental: Secondary | ICD-10-CM

## 2019-03-18 DIAGNOSIS — O26893 Other specified pregnancy related conditions, third trimester: Secondary | ICD-10-CM

## 2019-03-18 DIAGNOSIS — Z6791 Unspecified blood type, Rh negative: Secondary | ICD-10-CM

## 2019-03-18 DIAGNOSIS — O0993 Supervision of high risk pregnancy, unspecified, third trimester: Secondary | ICD-10-CM

## 2019-03-18 LAB — POCT URINALYSIS DIPSTICK OB
Blood, UA: NEGATIVE
Glucose, UA: NEGATIVE
Ketones, UA: NEGATIVE
Leukocytes, UA: NEGATIVE
Nitrite, UA: NEGATIVE
POC,PROTEIN,UA: NEGATIVE

## 2019-03-18 NOTE — Progress Notes (Signed)
Subjective:  [name] [name] is a 29 y.o. [phone] at [redacted]w[redacted]d being seen today for ongoing prenatal care.  She is currently monitored for the following issues for this high-risk pregnancy and has Affective disorder (HCC); Bipolar 2 disorder (HCC); Attention deficit hyperactivity disorder (ADHD), predominantly inattentive type; Post-traumatic arthritis of right ankle; Antalgic gait; De Quervain's tenosynovitis, right; GERD (gastroesophageal reflux disease); Supervision of high risk pregnancy, antepartum; Short interval between pregnancies affecting pregnancy, antepartum; Rh negative state in antepartum period; Gestational diabetes mellitus, class A1; Request for sterilization; and Stress incontinence during pregnancy on their problem list.  Patient reports general discomforts of pregnancy.  Contractions: Not present. Vag. Bleeding: None.  Movement: Present. Denies leaking of fluid.   The following portions of the patient's history were reviewed and updated as appropriate: allergies, current medications, past family history, past medical history, past social history, past surgical history and problem list. Problem list updated.  Objective:   Vitals:   03/18/19 1133  BP: 114/77  Pulse: 98  Weight: 193 lb (87.5 kg)    Fetal Status:     Movement: Present     General:  Alert, oriented and cooperative. Patient is in no acute distress.  Skin: Skin is warm and dry. No rash noted.   Cardiovascular: Normal heart rate noted  Respiratory: Normal respiratory effort, no problems with respiration noted  Abdomen: Soft, gravid, appropriate for gestational age. Pain/Pressure: Present     Pelvic:  Cervical exam performed        Extremities: Normal range of motion.  Edema: None  Mental Status: Normal mood and affect. Normal behavior. Normal judgment and thought content.   Urinalysis:      Assessment and Plan:  Pregnancy: [id number] at [redacted]w[redacted]d  1. Screening for genitourinary condition  - POC Urinalysis Dipstick  OB  2. Pregnant state, incidental  - POC Urinalysis Dipstick OB  3. Supervision of high risk pregnancy, antepartum Stable Labor precautions  4. Gestational diabetes mellitus, class A1 Did not bring CBG's but reports in goal range Growth 73 % 0n 02/25/19 IOL at 40 weeks   5. Rh negative state in antepartum period S/P Rhogam  6. Request for sterilization BTL papers signed  Term labor symptoms and general obstetric precautions including but not limited to vaginal bleeding, contractions, leaking of fluid and fetal movement were reviewed in detail with the patient. Please refer to After Visit Summary for other counseling recommendations.  Return in about 1 week (around 03/25/2019) for OB visit, face to face, MD provider.   Hermina Staggers, MD

## 2019-03-18 NOTE — Patient Instructions (Signed)

## 2019-03-25 ENCOUNTER — Telehealth (HOSPITAL_COMMUNITY): Payer: Self-pay | Admitting: *Deleted

## 2019-03-25 ENCOUNTER — Other Ambulatory Visit: Payer: Self-pay

## 2019-03-25 ENCOUNTER — Ambulatory Visit (INDEPENDENT_AMBULATORY_CARE_PROVIDER_SITE_OTHER): Payer: Medicaid Other

## 2019-03-25 ENCOUNTER — Encounter (HOSPITAL_COMMUNITY): Payer: Self-pay | Admitting: *Deleted

## 2019-03-25 VITALS — BP 116/71 | HR 83 | Wt 195.0 lb

## 2019-03-25 DIAGNOSIS — Z331 Pregnant state, incidental: Secondary | ICD-10-CM

## 2019-03-25 DIAGNOSIS — O0990 Supervision of high risk pregnancy, unspecified, unspecified trimester: Secondary | ICD-10-CM

## 2019-03-25 DIAGNOSIS — O0993 Supervision of high risk pregnancy, unspecified, third trimester: Secondary | ICD-10-CM

## 2019-03-25 DIAGNOSIS — Z1389 Encounter for screening for other disorder: Secondary | ICD-10-CM

## 2019-03-25 DIAGNOSIS — O24410 Gestational diabetes mellitus in pregnancy, diet controlled: Secondary | ICD-10-CM

## 2019-03-25 DIAGNOSIS — Z3A38 38 weeks gestation of pregnancy: Secondary | ICD-10-CM

## 2019-03-25 DIAGNOSIS — Z302 Encounter for sterilization: Secondary | ICD-10-CM

## 2019-03-25 LAB — POCT URINALYSIS DIPSTICK OB
Blood, UA: NEGATIVE
Glucose, UA: NEGATIVE
Ketones, UA: NEGATIVE
Leukocytes, UA: NEGATIVE
Nitrite, UA: NEGATIVE

## 2019-03-25 NOTE — Telephone Encounter (Signed)
Preadmission screen  

## 2019-03-25 NOTE — Addendum Note (Signed)
Addended by: Gerrit Heck L on: 03/25/2019 03:07 PM   Modules accepted: Orders, SmartSet

## 2019-03-25 NOTE — Patient Instructions (Signed)

## 2019-03-25 NOTE — Progress Notes (Signed)
   PRENATAL VISIT NOTE  Subjective:  [name] [name] is a 29 y.o. [id number] at [redacted]w[redacted]d who presents today for routine prenatal care.  She is currently being monitored for supervision of a high-risk pregnancy with problems as listed below.  Patient has no pregnancy related concerns and endorses fetal movement.  She states her blood sugars have been normal with one elevated (147) after eating ramen noodles.  She states that she thinks this baby is bigger than previous babies and questions when she will be induced.  She reports some intermittent contractions and denies vaginal concerns including discharge, bleeding, leaking, itching, and burning.   Patient Active Problem List   Diagnosis Date Noted  . Stress incontinence during pregnancy 03/11/2019  . Request for sterilization 02/09/2019  . Gestational diabetes mellitus, class A1 12/31/2018  . Supervision of high risk pregnancy, antepartum 09/22/2018  . Short interval between pregnancies affecting pregnancy, antepartum 09/22/2018  . Rh negative state in antepartum period 09/22/2018  . GERD (gastroesophageal reflux disease) 12/02/2017  . De Quervain's tenosynovitis, right 02/28/2016  . Antalgic gait 11/02/2015  . Attention deficit hyperactivity disorder (ADHD), predominantly inattentive type 10/12/2014  . 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 Bipolar 2 disorder (HCC) 09/08/2014  . Affective disorder (HCC) 09/07/2014    The following portions of the patient's history were reviewed and updated as appropriate: allergies, current medications, past family history, past medical history, past social history, past surgical history and problem list. Problem list updated.  Objective:   Vitals:   03/25/19 1016  BP: 116/71  Pulse: 83    Fetal Status: Fetal Heart Rate (bpm): 134 Fundal Height: 39 cm Movement: Present     General:  Alert, oriented and cooperative. Patient is in no acute distress.  Skin: Skin is warm and dry.     Cardiovascular: Regular rate and rhythm.  Respiratory: Normal respiratory effort. CTA-Bilaterally  Abdomen: Soft, gravid, appropriate for gestational age.  Pelvic: Cervical exam deferred        Extremities: Normal range of motion.  Edema: None  Mental Status: Normal mood and affect. Normal behavior. Normal judgment and thought content.   Assessment and Plan:  Pregnancy: [id number] at [redacted]w[redacted]d  1. Supervision of high risk pregnancy, antepartum -Reviewed induction of labor including preparations, methods, and availability of pain medication. -Questions and concerns addressed.  2. Gestational diabetes mellitus, class A1 -States fasting have been 89 and below, while after meals have been 121. -Patient to be scheduled for IOL at 40 weeks.  Instructed to bring records of blood sugars at next visit.  3. Request for sterilization -Papers signed. -Reviewed PPBTL procedure.    Term labor symptoms and general obstetric precautions including but not limited to vaginal bleeding, contractions, leaking of fluid and fetal movement were reviewed with the patient.  Please refer to After Visit Summary for other counseling recommendations.  Return in about 1 week (around 04/01/2019) for HR-ROB.  Future Appointments  Date Time Provider Department Center  03/31/2019  9:30 AM [facility]-SCREENING [facility] None  04/01/2019 10:10 AM Lazaro Arms, MD [facility]  04/02/2019  8:10 AM [facility]-LD SCHED ROOM [facility] None    Cherre Robins, CNM 03/25/2019, 10:35 AM

## 2019-03-25 NOTE — Progress Notes (Signed)
  Induction Assessment Scheduling Form: Fax to [facility]:  [phone]  [name] [name] 11-Aug-1990 [id number]                                                                                                                                      Phone #:    [phone]                 Provider:  [facility]  GP:  [id number] Estimated Date of Delivery: 04/02/19 Dating Criteria: Early [place] at 6 weeks    Medical Indications for induction:  GDM Admission Date/Time:  Apr 02, 2019 Gestational age on admission:  40 weeks  Filed Weights   03/25/19 1016  Weight: 195 lb (88.5 kg)   HIV: Non Reactive (11/12 0856)  GBS: Negative/-- (01/22 1139)  Pelvic Exam to be performed at [facility]   Method of induction(proposed):  Cytotec   Scheduling Provider Signature:  Cherre Robins MSN, CNM Advanced Practice Provider, Center for [facility]                                 Today's Date:  03/25/2019

## 2019-03-30 ENCOUNTER — Other Ambulatory Visit: Payer: Self-pay | Admitting: Advanced Practice Midwife

## 2019-03-31 ENCOUNTER — Other Ambulatory Visit (HOSPITAL_COMMUNITY)
Admission: RE | Admit: 2019-03-31 | Discharge: 2019-03-31 | Disposition: A | Discharge status: Home / Self Care | Payer: Medicaid Other | Source: Ambulatory Visit | Attending: Obstetrics & Gynecology | Admitting: Obstetrics & Gynecology

## 2019-03-31 ENCOUNTER — Other Ambulatory Visit (HOSPITAL_COMMUNITY): Payer: Medicaid Other

## 2019-03-31 ENCOUNTER — Other Ambulatory Visit: Payer: Self-pay

## 2019-03-31 LAB — SARS CORONAVIRUS 2 (TAT 6-24 HRS): SARS Coronavirus 2: NEGATIVE

## 2019-04-01 ENCOUNTER — Encounter: Payer: Medicaid Other | Admitting: Obstetrics & Gynecology

## 2019-04-01 ENCOUNTER — Encounter (HOSPITAL_COMMUNITY): Payer: Self-pay | Admitting: Anesthesiology

## 2019-04-01 ENCOUNTER — Inpatient Hospital Stay (HOSPITAL_COMMUNITY)
Admission: AD | Admit: 2019-04-01 | Discharge: 2019-04-02 | DRG: 806 | Disposition: A | Discharge status: Home / Self Care | Payer: Medicaid Other | Attending: Obstetrics & Gynecology | Admitting: Obstetrics & Gynecology

## 2019-04-01 ENCOUNTER — Encounter (HOSPITAL_COMMUNITY)
Admission: AD | Disposition: A | Discharge status: Home / Self Care | Payer: Self-pay | Source: Home / Self Care | Attending: Obstetrics & Gynecology

## 2019-04-01 ENCOUNTER — Other Ambulatory Visit: Payer: Self-pay

## 2019-04-01 ENCOUNTER — Encounter (HOSPITAL_COMMUNITY): Payer: Self-pay | Admitting: Obstetrics & Gynecology

## 2019-04-01 DIAGNOSIS — F17210 Nicotine dependence, cigarettes, uncomplicated: Secondary | ICD-10-CM | POA: Diagnosis present

## 2019-04-01 DIAGNOSIS — Z20822 Contact with and (suspected) exposure to covid-19: Secondary | ICD-10-CM | POA: Diagnosis present

## 2019-04-01 DIAGNOSIS — Z8632 Personal history of gestational diabetes: Secondary | ICD-10-CM | POA: Diagnosis present

## 2019-04-01 DIAGNOSIS — O24420 Gestational diabetes mellitus in childbirth, diet controlled: Secondary | ICD-10-CM | POA: Diagnosis not present

## 2019-04-01 DIAGNOSIS — F3181 Bipolar II disorder: Secondary | ICD-10-CM | POA: Diagnosis present

## 2019-04-01 DIAGNOSIS — Z3A39 39 weeks gestation of pregnancy: Secondary | ICD-10-CM

## 2019-04-01 DIAGNOSIS — O26893 Other specified pregnancy related conditions, third trimester: Secondary | ICD-10-CM | POA: Diagnosis present

## 2019-04-01 DIAGNOSIS — O0990 Supervision of high risk pregnancy, unspecified, unspecified trimester: Secondary | ICD-10-CM

## 2019-04-01 DIAGNOSIS — Z30017 Encounter for initial prescription of implantable subdermal contraceptive: Secondary | ICD-10-CM | POA: Diagnosis not present

## 2019-04-01 DIAGNOSIS — O09899 Supervision of other high risk pregnancies, unspecified trimester: Secondary | ICD-10-CM

## 2019-04-01 DIAGNOSIS — Z3042 Encounter for surveillance of injectable contraceptive: Secondary | ICD-10-CM | POA: Diagnosis not present

## 2019-04-01 DIAGNOSIS — Z6791 Unspecified blood type, Rh negative: Secondary | ICD-10-CM | POA: Diagnosis not present

## 2019-04-01 DIAGNOSIS — O99334 Smoking (tobacco) complicating childbirth: Secondary | ICD-10-CM | POA: Diagnosis present

## 2019-04-01 DIAGNOSIS — Z302 Encounter for sterilization: Secondary | ICD-10-CM

## 2019-04-01 DIAGNOSIS — O99344 Other mental disorders complicating childbirth: Secondary | ICD-10-CM | POA: Diagnosis present

## 2019-04-01 DIAGNOSIS — O26899 Other specified pregnancy related conditions, unspecified trimester: Secondary | ICD-10-CM

## 2019-04-01 LAB — CBC
HCT: 36 % (ref 36.0–46.0)
Hemoglobin: 11.3 g/dL — ABNORMAL LOW (ref 12.0–15.0)
MCH: 24.1 pg — ABNORMAL LOW (ref 26.0–34.0)
MCHC: 31.4 g/dL (ref 30.0–36.0)
MCV: 76.8 fL — ABNORMAL LOW (ref 80.0–100.0)
Platelets: 206 10*3/uL (ref 150–400)
RBC: 4.69 MIL/uL (ref 3.87–5.11)
RDW: 16.4 % — ABNORMAL HIGH (ref 11.5–15.5)
WBC: 15.2 10*3/uL — ABNORMAL HIGH (ref 4.0–10.5)
nRBC: 0 % (ref 0.0–0.2)

## 2019-04-01 LAB — GLUCOSE, CAPILLARY: Glucose-Capillary: 109 mg/dL — ABNORMAL HIGH (ref 70–99)

## 2019-04-01 SURGERY — LIGATION, FALLOPIAN TUBE, POSTPARTUM
Anesthesia: Choice

## 2019-04-01 MED ORDER — COCONUT OIL OIL: 1.0000 "application " | TOPICAL_OIL | Status: DC | PRN

## 2019-04-01 MED ORDER — LACTATED RINGERS IV SOLN: 500.0000 mL | INTRAVENOUS | Status: DC | PRN

## 2019-04-01 MED ORDER — ONDANSETRON HCL 4 MG/2ML IJ SOLN
4.0000 mg | Freq: Four times a day (QID) | INTRAMUSCULAR | Status: DC | PRN
  Administered 2019-04-01: 4 mg via INTRAVENOUS
  Filled 2019-04-01: qty 2

## 2019-04-01 MED ORDER — SIMETHICONE 80 MG PO CHEW: 80.0000 mg | CHEWABLE_TABLET | ORAL | Status: DC | PRN

## 2019-04-01 MED ORDER — EPHEDRINE 5 MG/ML INJ: 10.0000 mg | INTRAVENOUS | Status: DC | PRN

## 2019-04-01 MED ORDER — SOD CITRATE-CITRIC ACID 500-334 MG/5ML PO SOLN
30.0000 mL | Freq: Once | ORAL | Status: DC
  Filled 2019-04-01: qty 30

## 2019-04-01 MED ORDER — WITCH HAZEL-GLYCERIN EX PADS: 1.0000 "application " | MEDICATED_PAD | CUTANEOUS | Status: DC | PRN

## 2019-04-01 MED ORDER — ONDANSETRON HCL 4 MG/2ML IJ SOLN: 4.0000 mg | INTRAMUSCULAR | Status: DC | PRN

## 2019-04-01 MED ORDER — PHENYLEPHRINE 40 MCG/ML (10ML) SYRINGE FOR IV PUSH (FOR BLOOD PRESSURE SUPPORT): 80.0000 ug | PREFILLED_SYRINGE | INTRAVENOUS | Status: DC | PRN

## 2019-04-01 MED ORDER — OXYTOCIN 40 UNITS IN NORMAL SALINE INFUSION - SIMPLE MED
2.5000 [IU]/h | INTRAVENOUS | Status: DC
  Filled 2019-04-01: qty 1000

## 2019-04-01 MED ORDER — TERBUTALINE SULFATE 1 MG/ML IJ SOLN: 0.2500 mg | Freq: Once | INTRAMUSCULAR | Status: DC | PRN

## 2019-04-01 MED ORDER — ESCITALOPRAM OXALATE 10 MG PO TABS
5.0000 mg | ORAL_TABLET | Freq: Every day | ORAL | Status: DC
  Administered 2019-04-01 – 2019-04-02 (×2): 5 mg via ORAL
  Filled 2019-04-01 (×2): qty 1

## 2019-04-01 MED ORDER — PRENATAL MULTIVITAMIN CH
1.0000 | ORAL_TABLET | Freq: Every day | ORAL | Status: DC
  Administered 2019-04-02: 1 via ORAL
  Filled 2019-04-01: qty 1

## 2019-04-01 MED ORDER — DIPHENHYDRAMINE HCL 50 MG/ML IJ SOLN: 12.5000 mg | INTRAMUSCULAR | Status: DC | PRN

## 2019-04-01 MED ORDER — SOD CITRATE-CITRIC ACID 500-334 MG/5ML PO SOLN: 30.0000 mL | ORAL | Status: DC | PRN

## 2019-04-01 MED ORDER — OXYTOCIN BOLUS FROM INFUSION
500.0000 mL | Freq: Once | INTRAVENOUS | Status: AC
  Administered 2019-04-01: 500 mL via INTRAVENOUS

## 2019-04-01 MED ORDER — DIBUCAINE (PERIANAL) 1 % EX OINT: 1.0000 "application " | TOPICAL_OINTMENT | CUTANEOUS | Status: DC | PRN

## 2019-04-01 MED ORDER — ACETAMINOPHEN 325 MG PO TABS: 650.0000 mg | ORAL_TABLET | ORAL | Status: DC | PRN

## 2019-04-01 MED ORDER — NICOTINE 7 MG/24HR TD PT24
7.0000 mg | MEDICATED_PATCH | Freq: Every day | TRANSDERMAL | Status: DC
  Administered 2019-04-01 – 2019-04-02 (×2): 7 mg via TRANSDERMAL
  Filled 2019-04-01 (×2): qty 1

## 2019-04-01 MED ORDER — LACTATED RINGERS IV SOLN: 500.0000 mL | Freq: Once | INTRAVENOUS | Status: DC

## 2019-04-01 MED ORDER — ONDANSETRON HCL 4 MG PO TABS: 4.0000 mg | ORAL_TABLET | ORAL | Status: DC | PRN

## 2019-04-01 MED ORDER — FENTANYL-BUPIVACAINE-NACL 0.5-0.125-0.9 MG/250ML-% EP SOLN
12.0000 mL/h | EPIDURAL | Status: DC | PRN
  Filled 2019-04-01: qty 250

## 2019-04-01 MED ORDER — OXYTOCIN 40 UNITS IN NORMAL SALINE INFUSION - SIMPLE MED: 1.0000 m[IU]/min | INTRAVENOUS | Status: DC

## 2019-04-01 MED ORDER — DIPHENHYDRAMINE HCL 25 MG PO CAPS: 25.0000 mg | ORAL_CAPSULE | Freq: Four times a day (QID) | ORAL | Status: DC | PRN

## 2019-04-01 MED ORDER — LIDOCAINE HCL (PF) 1 % IJ SOLN: 30.0000 mL | INTRAMUSCULAR | Status: DC | PRN

## 2019-04-01 MED ORDER — MISOPROSTOL 25 MCG QUARTER TABLET: 25.0000 ug | ORAL_TABLET | ORAL | Status: DC | PRN

## 2019-04-01 MED ORDER — FENTANYL CITRATE (PF) 100 MCG/2ML IJ SOLN
50.0000 ug | INTRAMUSCULAR | Status: DC | PRN
  Administered 2019-04-01: 100 ug via INTRAVENOUS
  Filled 2019-04-01: qty 2

## 2019-04-01 MED ORDER — BENZOCAINE-MENTHOL 20-0.5 % EX AERO
1.0000 "application " | INHALATION_SPRAY | CUTANEOUS | Status: DC | PRN
  Administered 2019-04-01: 1 via TOPICAL
  Filled 2019-04-01: qty 56

## 2019-04-01 MED ORDER — IBUPROFEN 600 MG PO TABS
600.0000 mg | ORAL_TABLET | Freq: Four times a day (QID) | ORAL | Status: DC
  Administered 2019-04-01 – 2019-04-02 (×4): 600 mg via ORAL
  Filled 2019-04-01 (×4): qty 1

## 2019-04-01 MED ORDER — LAMOTRIGINE 100 MG PO TABS
100.0000 mg | ORAL_TABLET | Freq: Every day | ORAL | Status: DC
  Administered 2019-04-01 – 2019-04-02 (×2): 100 mg via ORAL
  Filled 2019-04-01 (×2): qty 1

## 2019-04-01 MED ORDER — FERROUS SULFATE 325 (65 FE) MG PO TABS
325.0000 mg | ORAL_TABLET | Freq: Two times a day (BID) | ORAL | Status: DC
  Administered 2019-04-01 – 2019-04-02 (×2): 325 mg via ORAL
  Filled 2019-04-01 (×2): qty 1

## 2019-04-01 MED ORDER — LACTATED RINGERS IV SOLN: INTRAVENOUS | Status: DC

## 2019-04-01 MED ORDER — FAMOTIDINE IN NACL 20-0.9 MG/50ML-% IV SOLN
20.0000 mg | Freq: Once | INTRAVENOUS | Status: DC
  Filled 2019-04-01: qty 50

## 2019-04-01 MED ORDER — MAGNESIUM HYDROXIDE 400 MG/5ML PO SUSP: 30.0000 mL | ORAL | Status: DC | PRN

## 2019-04-01 NOTE — MAU Note (Signed)
Presents with c/o ctxs every 3-5 minutes apart for approximately 1 hour.  Denies VB or LOF.  Endorses +FM.

## 2019-04-01 NOTE — Anesthesia Preprocedure Evaluation (Deleted)
Anesthesia Evaluation  Patient identified by MRN, date of birth, ID band Patient awake    Reviewed: Allergy & Precautions, NPO status , Patient's Chart, lab work & pertinent test results  Airway        Dental   Pulmonary neg pulmonary ROS, Current Smoker,           Cardiovascular negative cardio ROS       Neuro/Psych PSYCHIATRIC DISORDERS Anxiety Depression Bipolar Disorder negative neurological ROS     GI/Hepatic Neg liver ROS, GERD  ,  Endo/Other  diabetes, Gestational  Renal/GU negative Renal ROS  negative genitourinary   Musculoskeletal  (+) Arthritis ,   Abdominal   Peds  (+) ADHD Hematology negative hematology ROS (+)   Anesthesia Other Findings PPTL  Reproductive/Obstetrics (+) Pregnancy                             Anesthesia Physical Anesthesia Plan  ASA: III  Anesthesia Plan: Spinal   Post-op Pain Management:    Induction:   PONV Risk Score and Plan: Treatment may vary due to age or medical condition  Airway Management Planned: Natural Airway  Additional Equipment:   Intra-op Plan:   Post-operative Plan:   Informed Consent: I have reviewed the patients History and Physical, chart, labs and discussed the procedure including the risks, benefits and alternatives for the proposed anesthesia with the patient or authorized representative who has indicated his/her understanding and acceptance.     Dental advisory given  Plan Discussed with: CRNA  Anesthesia Plan Comments:         Anesthesia Quick Evaluation

## 2019-04-01 NOTE — H&P (Signed)
[name] [name] is a 29 y.o. female presenting for SOL. She endorses recurrent painful contractions, new onset around 0930. She denies vaginal bleeding, leaking of fluid, decreased fetal movement, fever, falls, or recent illness.   Patient Active Problem List   Diagnosis Date Noted  . Indication for care or intervention in labor or delivery 04/01/2019  . Stress incontinence during pregnancy 03/11/2019  . Request for sterilization 02/09/2019  . Gestational diabetes mellitus, class A1 12/31/2018  . Supervision of high risk pregnancy, antepartum 09/22/2018  . Short interval between pregnancies affecting pregnancy, antepartum 09/22/2018  . Rh negative state in antepartum period 09/22/2018  . GERD (gastroesophageal reflux disease) 12/02/2017  . De Quervain's tenosynovitis, right 02/28/2016  . Antalgic gait 11/02/2015  . Attention deficit hyperactivity disorder (ADHD), predominantly inattentive type 10/12/2014  . 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 Bipolar 2 disorder ([place]) 09/08/2014  . Affective disorder ([place]) 09/07/2014   OB History    Gravida  4   Para  2   Term  2   Preterm      AB  1   Living  2     SAB  1   TAB      Ectopic      Multiple  0   Live Births  2          Past Medical History:  Diagnosis Date  . ADD (attention deficit disorder)   . ADHD (attention deficit hyperactivity disorder)   . Anxiety   . Bipolar disorder ([place])   . Depression    manid depressive w/ generalized anxiety  . Frequent UTI   . PMDD (premenstrual dysphoric disorder)    Past Surgical History:  Procedure Laterality Date  . TONSILLECTOMY     Family History: family history includes ADD / ADHD in her son; Alcohol abuse in her father; Anxiety disorder in her brother and mother; Arthritis in her father, sister, sister, and sister; Asthma in her mother and sister; Cancer in her father and mother; Depression in her father; Diabetes in her father, mother, paternal uncle,  and sister; Endometriosis in her sister; Hypertension in her brother, father, and mother; Uterine cancer (age of onset: 28) in her mother. Social History:  reports that she has been smoking cigarettes. She started smoking about 9 years ago. She has been smoking about 0.50 packs per day. She has never used smokeless tobacco. She reports previous alcohol use of about 4.0 standard drinks of alcohol per week. She reports that she does not use drugs.    Maternal Diabetes: Yes:  Diabetes Type:  Diet controlled Genetic Screening: Declined second IT  Maternal Ultrasounds/Referrals: Normal Fetal Ultrasounds or other Referrals:  None Maternal Substance Abuse:  No Significant Maternal Medications:  Meds include: Other:  Lamictal and Lexapro Significant Maternal Lab Results:  None Other Comments:  None  Review of Systems  Gastrointestinal: Positive for abdominal pain.  Genitourinary: Positive for vaginal pain.   Dilation: 7.5 Effacement (%): 100 Exam by:: ginger morris rn Blood pressure 120/67, pulse (!) 52, temperature (!) 97.5 F (36.4 C), temperature source Oral, resp. rate 18, height 5\' 3"  (1.6 m), weight 87.5 kg, last menstrual period 06/16/2018, SpO2 97 %, not currently breastfeeding. Physical Exam  Nursing note and vitals reviewed. Constitutional: She is oriented to person, place, and time. She appears well-developed and well-nourished.  Cardiovascular: Normal rate.  Respiratory: Effort normal. No respiratory distress.  GI:  Gravid  Neurological: She is alert and oriented to person, place, and time.  Psychiatric: She has a normal mood and affect. Her behavior is normal. Judgment and thought content normal.    Prenatal labs: ABO, Rh: --/--/O NEG (02/12 1052) Antibody: POS (02/12 1052) Rubella: 2.45 (08/05 1155) RPR: Non Reactive (11/12 0856)  HBsAg: Negative (08/05 1155)  HIV: Non Reactive (11/12 0856)  GBS: Negative/-- (01/22 1139)   Assessment/Plan: --29 y.o. [id number] at [redacted]w[redacted]d  admitted for SOL --Patient delivered precipitously shortly after her arrival on L&D --GBS negative --Desires inpatient BTL, consent signed 01/27/2019  Calvert Cantor, CNM 04/01/2019, 12:30 PM

## 2019-04-01 NOTE — Lactation Note (Signed)
This note was copied from a baby's chart. Lactation Consultation Note  Patient Name: [name] [name] Date: 04/01/2019 Reason for consult: Initial assessment;Term  P3 mother whose infant is now 70 hours old.  This is a term baby.  Mother breast fed her first child (now 30 years old) for 5 months and her second child (now 67 months old) for 2 months before switching to formula.  Mother plans to switch this baby to formula at about 2 months also.    Baby was asleep in the bassinet when I arrived.  Mother was happy to report that he latched and has fed well since birth.  She shared with me that this was her "gentlest" breast feeding baby and the largest baby.  She denies pain with latching.    Encouraged to feed 8-12 times/24 hours or sooner if he shows feeding cues.  Mother is familiar with feeding cues and hand expression.  Colostrum container provided and milk storage times reviewed.  Finger feeding demonstrated.  Suggested mother give him any EBM she obtains with hand expression.    Mom made aware of O/P services, breastfeeding support groups, community resources, and our phone # for post-discharge questions. Mother has been given a DEBP for home use and stated she will freeze some milk for father to feed.  Father will be a "stay at home" dad for now since he does not have a job currently  and mother will work from home.  Encouraged mother to call for assistance as needed.   Maternal Data Formula Feeding for Exclusion: No Has patient been taught Hand Expression?: Yes Does the patient have breastfeeding experience prior to this delivery?: Yes  Feeding    LATCH Score                   Interventions    Lactation Tools Discussed/Used     Consult Status Consult Status: Follow-up Date: 04/02/19 Follow-up type: In-patient    [name] [name] 04/01/2019, 6:21 PM

## 2019-04-01 NOTE — Discharge Summary (Signed)
Postpartum Discharge Summary     Patient Name: [name] [name] DOB: [birth year] MRN: [id number]  Date of admission: 04/01/2019 Delivering Provider: Darlina Rumpf   Date of discharge: 04/02/2019  Admitting diagnosis: Indication for care or intervention in labor or delivery [O75.9] Intrauterine pregnancy: [redacted]w[redacted]d    Secondary diagnosis:  Active Problems:   Bipolar 2 disorder ([place]   Short interval between pregnancies affecting pregnancy, antepartum   Rh negative state in antepartum period   Gestational diabetes mellitus, class A1   Indication for care or intervention in labor or delivery   Vaginal delivery  Additional problems: none     Discharge diagnosis: Term Pregnancy Delivered and GDM A1                                                                                                Post partum procedures:rhogam & Nexplanon placement  Augmentation: none  Complications: None  Hospital course:  Onset of Labor With Vaginal Delivery     29y.o. yo [id number] [id number] admitted in Active Labor on 04/01/2019. Patient had an uncomplicated labor course as follows:  Membrane Rupture Time/Date: 11:08 AM ,04/01/2019   Intrapartum Procedures: Episiotomy: None [1]                                         Lacerations:  None [1]  Patient had a delivery of a Viable infant. 04/01/2019  Information for the patient's newborn:  EdValrie, Jia0[[id number]]Delivery Method: Vaginal, Spontaneous(Filed from Delivery Summary)     Pateint had an uncomplicated postpartum course. Prenatally she had expressed interest in a ppBTL, but she changed her mind to Nexplanon after delivery and requested placement prior to d/c. Her FBS on PPD#1 was 82. She is ambulating, tolerating a regular diet, passing flatus, and urinating well. Patient is discharged home in stable condition on 04/02/19.  Delivery time: 11:13 AM    Magnesium Sulfate received: No BMZ received:  No Rhophylac:Yes MMR:N/A Transfusion:No  Physical exam  Vitals:   04/01/19 1530 04/01/19 1930 04/02/19 0004 04/02/19 0316  BP: 125/72 115/71 116/72 (!) 91/54  Pulse: (!) 52 (!) 48 97 (!) 56  Resp: 16 16 18 16   Temp: 98 F (36.7 C) 98.3 F (36.8 C) 98 F (36.7 C) 98.2 F (36.8 C)  TempSrc: Oral Oral Oral Oral  SpO2: 99%  98% 98%  Weight:      Height:       General: alert and cooperative Lochia: appropriate Uterine Fundus: firm Incision: N/A DVT Evaluation: No evidence of DVT seen on physical exam. Labs: Lab Results  Component Value Date   WBC 15.2 (H) 04/01/2019   HGB 11.3 (L) 04/01/2019   HCT 36.0 04/01/2019   MCV 76.8 (L) 04/01/2019   PLT 206 04/01/2019   CMP Latest Ref Rng & Units 02/26/2017  Glucose 65 - 99 mg/dL 96  BUN 6 - 20 mg/dL 8  Creatinine 0.57 - 1.00 mg/dL 0.58  Sodium 134 - 144 mmol/L  140  Potassium 3.5 - 5.2 mmol/L 4.0  Chloride 96 - 106 mmol/L 105  CO2 20 - 29 mmol/L 22  Calcium 8.7 - 10.2 mg/dL 9.4  Total Protein 6.0 - 8.5 g/dL 7.2  Total Bilirubin 0.0 - 1.2 mg/dL 0.2  Alkaline Phos 39 - 117 IU/L 100  AST 0 - 40 IU/L 15  ALT 0 - 32 IU/L 11   Edinburgh Score: Edinburgh Postnatal Depression Scale Screening Tool 01/27/2018  I have been able to laugh and see the funny side of things. 0  I have looked forward with enjoyment to things. 0  I have blamed myself unnecessarily when things went wrong. 0  I have been anxious or worried for no good reason. 0  I have felt scared or panicky for no good reason. 0  Things have been getting on top of me. 1  I have been so unhappy that I have had difficulty sleeping. 0  I have felt sad or miserable. 0  I have been so unhappy that I have been crying. 1  The thought of harming myself has occurred to me. 0  Edinburgh Postnatal Depression Scale Total 2    Discharge instruction: per After Visit Summary and "Baby and Me Booklet".  After visit meds:  Allergies as of 04/02/2019   No Known Allergies      Medication List    STOP taking these medications   Accu-Chek FastClix Lancets Misc   Accu-Chek Guide Me w/Device Kit   Accu-Chek Guide test strip Generic drug: glucose blood   Blood Pressure Cuff Misc   omeprazole 20 MG capsule Commonly known as: PriLOSEC     TAKE these medications   escitalopram 5 MG tablet Commonly known as: LEXAPRO Take 1 tablet (5 mg total) by mouth daily.   ferrous sulfate 325 (65 FE) MG tablet Take 1 tablet (325 mg total) by mouth daily with breakfast.   ibuprofen 600 MG tablet Commonly known as: ADVIL Take 1 tablet (600 mg total) by mouth every 6 (six) hours as needed.   lamoTRIgine 100 MG tablet Commonly known as: LAMICTAL Take 1 tablet (100 mg total) by mouth daily.   PNV Prenatal Plus Multivitamin 27-1 MG Tabs Take 1 tablet by mouth daily.       Diet: routine diet  Activity: Advance as tolerated. Pelvic rest for 6 weeks.   Outpatient follow up:4wks with GTT Follow up Appt: No future appointments. Follow up Visit:  (Message sent by Maryelizabeth Kaufmann, CNM on 04/01/2019)  Please schedule this patient for Postpartum visit in: 4 weeks with the following provider: Any provider For C/S patients schedule nurse incision check in weeks 2 weeks: no High risk pregnancy complicated by: GDM Delivery mode: SVD Anticipated Birth Control: BTL done PP PP Procedures needed: 2 hour GTT  Schedule Integrated [place] visit: yes  Newborn Data: Live born female  Birth Weight: 8 lb 3.4 oz (3725 g) APGAR: 8, 9  Newborn Delivery   Birth date/time: 04/01/2019 11:13:00 Delivery type: Vaginal, Spontaneous      Baby Feeding: Bottle and Breast Disposition:home with mother   04/02/2019 Myrtis Ser, CNM  8:36 AM

## 2019-04-02 ENCOUNTER — Inpatient Hospital Stay (HOSPITAL_COMMUNITY): Payer: Medicaid Other

## 2019-04-02 ENCOUNTER — Inpatient Hospital Stay (HOSPITAL_COMMUNITY)
Admission: RE | Admit: 2019-04-02 | Payer: Medicaid Other | Source: Home / Self Care | Admitting: Obstetrics and Gynecology

## 2019-04-02 ENCOUNTER — Ambulatory Visit: Payer: Self-pay

## 2019-04-02 DIAGNOSIS — Z3042 Encounter for surveillance of injectable contraceptive: Secondary | ICD-10-CM

## 2019-04-02 LAB — RPR: RPR Ser Ql: NONREACTIVE

## 2019-04-02 LAB — GLUCOSE, CAPILLARY: Glucose-Capillary: 82 mg/dL (ref 70–99)

## 2019-04-02 MED ORDER — LIDOCAINE HCL 1 % IJ SOLN: 0.0000 mL | Freq: Once | INTRAMUSCULAR | Status: DC | PRN

## 2019-04-02 MED ORDER — ETONOGESTREL 68 MG ~~LOC~~ IMPL
68.0000 mg | DRUG_IMPLANT | Freq: Once | SUBCUTANEOUS | Status: AC
  Administered 2019-04-02: 68 mg via SUBCUTANEOUS
  Filled 2019-04-02: qty 1

## 2019-04-02 MED ORDER — LIDOCAINE HCL 1 % IJ SOLN
0.0000 mL | Freq: Once | INTRAMUSCULAR | Status: AC | PRN
  Administered 2019-04-02: 20 mL via INTRADERMAL
  Filled 2019-04-02: qty 20

## 2019-04-02 MED ORDER — FERROUS SULFATE 325 (65 FE) MG PO TABS: 325.0000 mg | ORAL_TABLET | Freq: Every day | ORAL | 3 refills | Status: DC

## 2019-04-02 MED ORDER — RHO D IMMUNE GLOBULIN 1500 UNIT/2ML IJ SOSY
300.0000 ug | PREFILLED_SYRINGE | Freq: Once | INTRAMUSCULAR | Status: AC
  Administered 2019-04-02: 300 ug via INTRAMUSCULAR
  Filled 2019-04-02: qty 2

## 2019-04-02 MED ORDER — IBUPROFEN 600 MG PO TABS: 600.0000 mg | ORAL_TABLET | Freq: Four times a day (QID) | ORAL | 0 refills | Status: DC | PRN

## 2019-04-02 MED ORDER — ETONOGESTREL 68 MG ~~LOC~~ IMPL: 68.0000 mg | DRUG_IMPLANT | Freq: Once | SUBCUTANEOUS | Status: DC

## 2019-04-02 NOTE — Lactation Note (Signed)
This note was copied from a baby's chart. Lactation Consultation Note: Mother is a P3, infant is 72 hours old and is now at 3 % wt loss. Mother reports that infant is feeding  well.  Reviewed hand expression with mother. Observed large drops of colostrum. Mother was given a harmony hand pump with instructions. Mothers nipples are erect with compressible breast tissue.    Mother was observed with infant latched on at the left breast. Observed infant suckling and infant released breast with mother nipple round .  Mother denies pain or discomfort with breastfeeding.   Mother to continue to cue base feed infant and feed at least 8-12 times or more in 24 hours and advised to cluster feed infant as needed. Mother to continue to due STS. Mother is aware of available LC services   Patient Name: Boy Lynnox Girten [id number] Date: 04/02/2019 Reason for consult: Follow-up assessment   Maternal Data    Feeding Feeding Type: Breast Fed  LATCH Score                   Interventions Interventions: Skin to skin;Hand express  Lactation Tools Discussed/Used     Consult Status Consult Status: Complete    Michel Bickers 04/02/2019, 2:05 PM

## 2019-04-02 NOTE — Discharge Instructions (Signed)

## 2019-04-02 NOTE — Clinical Social Work Maternal (Signed)
CLINICAL SOCIAL WORK MATERNAL/CHILD NOTE  Patient Details  Name: [name] [name] MRN: [id number] Date of Birth: 08/16/1990  Date:  Mar 20, 2019  Clinical Social Worker Initiating Note:  Caitlen Worth D. Lissa Morales, MSW, LCSWA Date/Time: Initiated:  04/02/19/1030     Child's Name:  [name] [name]   Biological Parents:  Mother, Father([name] [name])   Need for Interpreter:  None   Reason for Referral:   Mental Health History/Behavior Health Concerns   Address:  [address] [place] [address]    Phone number:  [phone] (home)     Additional phone number: [phone]  Household Members/Support Persons (HM/SP):   Household Member/Support Person 4, Household Member/Support Person 1   HM/SP Name Relationship DOB or Age  HM/SP -1 [name] [name] 04/08/1990  HM/SP -2  [name] [name]  FOB  07/05/1991  HM/SP -3  [name] [name]  brother of baby  12/18/2017  HM/SP -[name]  Brother of baby  10/18/2010  HM/SP -5        HM/SP -6        HM/SP -7        HM/SP -8          Natural Supports (not living in the home):  Extended Family, Parent   Professional Supports: Transport planner, Other (Comment)   Employment: Full-time   Type of Work: Warehouse manager   Education:  Nurse, adult   Homebound arranged:  N/A  Museum/gallery curator Resources:   MEDICAID   Other Resources:  Physicist, medical , [place] Considerations Which May Impact Care:  None reported  Strengths:  Understanding of illness, Psychotropic Medications   Psychotropic Medications:  Lamictal, Lexapro      Pediatrician:     [place]  Pediatrician List:   [place]      Pediatrician Fax Number:    Risk Factors/Current Problems:  Mental Health Concerns    Cognitive State:  Alert , Able to Concentrate , Insightful    Mood/Affect:  Calm , Happy , Relaxed    CSW Assessment: CSW met with  MOB at bedside after consult for hx of depression/anxiety/Bipolar 2 dx. MOB was pleasant and engaging through visit. FOB [name] [name] was present during this visit at the request of MOB. Newborn, [name] [name] was present and lying beside MOB.   MOB reports [employer] as highest level of education. MOB reports working full-time at [employer] in [place], [place]. MOB reports mood as stable, however, she stated plan to request increase of BH RX now that baby is born. She reported stopping medications during first trimester for the safety of baby. She reports restarting medication after first trimester but feels now that baby is here she may need an increase to maintain mood stability.  MOB reports medications have been very helpful. She reports attending therapy as needed at the same location as psychiatrist. MOB and FOB reported both sets of parents and grandparents as their supports.  MOB reported household includes self, FOB, 30 year old female child [name] [name], 30 year old female child [name], and newborn [name] [name]. MOB denies any CPS hx or concerns. MOB reports having all needed items for baby. MOB reports being on [facility] and FS. MOB denied any transportation barriers for self and baby to and from medical appointments.  CSW provided education to MOB and FOB on PPD an SIDS. CSW  provided MOB PPD checklist to keep track of feelings. Both denied any questions following education.   During chart review, CSW notice THC use during pregnancy, 10/02/2018 positive test. CSW explained [name] drug screen policy and consequences of UDS or CDS is positive MOB reported understanding and asked no further questions regarding drug screen policy. MOB denied any current or past CPS involvement.   CSW Plan/Description:  No Further Intervention Required/No Barriers to Discharge, Perinatal Mood and Anxiety Disorder (PMADs) Education, Sudden Infant Death Syndrome (SIDS) Education, [place], CSW  Will Continue to Monitor Umbilical Cord Tissue Drug Screen Results and Make Report if [name] [name], LCSWA 25-Nov-2019, 12:14 PM

## 2019-04-02 NOTE — Procedures (Signed)
GYNECOLOGY OFFICE PROCEDURE NOTE  [name] [name] is a 29 y.o. [phone] who is PP from a NSVD. She would like Nexplanon for contraception. R/B/A reviewed with the patient.   Nexplanon Insertion Procedure Patient identified, informed consent performed, consent signed.   Patient does understand that irregular bleeding is a very common side effect of this medication. She was advised to have backup contraception for one week after placement.  Appropriate time out taken.  Patient's left arm was prepped and draped in the usual sterile fashion. The ruler used to measure and mark insertion area.  Patient was prepped with alcohol swab and then injected with 3 ml of 1% lidocaine.  She was prepped with betadine, Nexplanon removed from packaging,  Device confirmed in needle, then inserted full length of needle and withdrawn per handbook instructions. Nexplanon was able to palpated in the patient's arm; patient palpated the insert herself. There was minimal blood loss.  Patient insertion site covered with guaze and a pressure bandage to reduce any bruising.  The patient tolerated the procedure well and was given post procedure instructions.   Thressa Sheller DNP, CNM  04/02/19  10:45 AM

## 2019-04-03 LAB — RH IG WORKUP (INCLUDES ABO/RH)
ABO/RH(D): O NEG
Fetal Screen: NEGATIVE
Gestational Age(Wks): 39.6
Unit division: 0

## 2019-04-04 LAB — BPAM RBC
Blood Product Expiration Date: 202103172359
Blood Product Expiration Date: 202103192359
Unit Type and Rh: 9500
Unit Type and Rh: 9500

## 2019-04-04 LAB — TYPE AND SCREEN
ABO/RH(D): O NEG
Antibody Screen: POSITIVE
Unit division: 0
Unit division: 0

## 2019-04-06 NOTE — Progress Notes (Signed)
Patient NOS appt 

## 2019-04-07 ENCOUNTER — Other Ambulatory Visit: Payer: Self-pay

## 2019-04-07 ENCOUNTER — Encounter: Payer: Medicaid Other | Admitting: Physician Assistant

## 2019-05-10 ENCOUNTER — Other Ambulatory Visit: Payer: Self-pay

## 2019-05-10 ENCOUNTER — Telehealth (INDEPENDENT_AMBULATORY_CARE_PROVIDER_SITE_OTHER): Payer: Medicaid Other | Admitting: Women's Health

## 2019-05-10 ENCOUNTER — Encounter: Payer: Self-pay | Admitting: Women's Health

## 2019-05-10 DIAGNOSIS — Z30017 Encounter for initial prescription of implantable subdermal contraceptive: Secondary | ICD-10-CM | POA: Insufficient documentation

## 2019-05-10 DIAGNOSIS — Z1389 Encounter for screening for other disorder: Secondary | ICD-10-CM

## 2019-05-10 DIAGNOSIS — O24430 Gestational diabetes mellitus in the puerperium, diet controlled: Secondary | ICD-10-CM

## 2019-05-10 DIAGNOSIS — Z8632 Personal history of gestational diabetes: Secondary | ICD-10-CM

## 2019-05-10 NOTE — Patient Instructions (Signed)
You will have your sugar test next visit.  Please do not eat or drink anything after midnight the night before you come, not even water.  You will be here for at least two hours.  Please make an appointment online for the bloodwork at [email or web address] for 8:30am (or as close to this as possible). Make sure you select the [address] service center. The day of the appointment, check in with our office first, then you will go to [facility] to start the sugar test.   

## 2019-05-10 NOTE — Progress Notes (Signed)
Patient: [name] [name], Female    DOB: 02-19-1990, 29 y.o.   MRN: [id number] Visit Date: 05/12/2019  Today's Provider: Margaretann Loveless, PA-C   Chief Complaint  Patient presents with  . Annual Exam   Subjective:     Annual physical exam [name] [name] is a 29 y.o. female who presents today for health maintenance and complete physical. She feels well. She reports exercising, not regularly. She reports she is sleeping well.  ----------------------------------------------------------------- Followed by OBGYN for breast and pelvic/pap exams  Review of Systems  Constitutional: Negative.   HENT: Negative.   Eyes: Negative.   Respiratory: Negative.   Cardiovascular: Negative.   Gastrointestinal: Negative.   Endocrine: Negative.   Genitourinary: Negative.   Musculoskeletal: Negative.   Skin: Negative.   Allergic/Immunologic: Negative.   Neurological: Negative.   Hematological: Negative.   Psychiatric/Behavioral: Positive for dysphoric mood (crying).    Social History      She  reports that she has been smoking cigarettes. She started smoking about 9 years ago. She has been smoking about 0.50 packs per day. She has never used smokeless tobacco. She reports previous alcohol use of about 4.0 standard drinks of alcohol per week. She reports that she does not use drugs.       Social History   Socioeconomic History  . Marital status: Single    Spouse name: Not on file  . Number of children: 2  . Years of education: Not on file  . Highest education level: Associate degree: academic program  Occupational History  . Occupation: unemployed  Tobacco Use  . Smoking status: Current Every Day Smoker    Packs/day: 0.50    Types: Cigarettes    Start date: 09/06/2009  . Smokeless tobacco: Never Used  . Tobacco comment: 2-3 cigs per day  Substance and Sexual Activity  . Alcohol use: Not Currently    Alcohol/week: 4.0 standard drinks    Types: 4 Glasses of wine per week  .  Drug use: No  . Sexual activity: Yes    Birth control/protection: None  Other Topics Concern  . Not on file  Social History Narrative  . Not on file   Social Determinants of Health   Financial Resource Strain: High Risk  . Difficulty of Paying Living Expenses: Hard  Food Insecurity: No Food Insecurity  . Worried About Programme researcher, broadcasting/film/video in the Last Year: Never true  . Ran Out of Food in the Last Year: Never true  Transportation Needs: Unmet Transportation Needs  . Lack of Transportation (Medical): Yes  . Lack of Transportation (Non-Medical): Yes  Physical Activity: Insufficiently Active  . Days of Exercise per Week: 6 days  . Minutes of Exercise per Session: 10 min  Stress: Stress Concern Present  . Feeling of Stress : Rather much  Social Connections: Moderately Isolated  . Frequency of Communication with Friends and Family: Once a week  . Frequency of Social Gatherings with Friends and Family: Never  . Attends Religious Services: Never  . Active Member of Clubs or Organizations: No  . Attends Banker Meetings: Never  . Marital Status: Living with partner    Past Medical History:  Diagnosis Date  . ADD (attention deficit disorder)   . ADHD (attention deficit hyperactivity disorder)   . Anxiety   . Bipolar disorder (HCC)   . Depression    manid depressive w/ generalized anxiety  . Frequent UTI   . PMDD (premenstrual dysphoric disorder)  Patient Active Problem List   Diagnosis Date Noted  . Nexplanon insertion 05/10/2019  . History of gestational diabetes 12/31/2018  . GERD (gastroesophageal reflux disease) 12/02/2017  . De Quervain's tenosynovitis, right 02/28/2016  . Antalgic gait 11/02/2015  . Attention deficit hyperactivity disorder (ADHD), predominantly inattentive type 10/12/2014  . 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 Bipolar 2 disorder (HCC) 09/08/2014  . Affective disorder (HCC) 09/07/2014    Past Surgical History:    Procedure Laterality Date  . TONSILLECTOMY      Family History        Family Status  Relation Name Status  . Mother  Alive  . Father  Alive  . Sister  Alive  . Brother  Alive  . Sister  Alive  . Sister  Deceased  . [employer]  Alive  . Son  Alive  . MGM  Deceased  . MGF  Alive  . PGM  Deceased  . PGF  Deceased  . Son  Alive        Her family history includes ADD / ADHD in her son; Alcohol abuse in her father; Anxiety disorder in her brother and mother; Arthritis in her father, sister, sister, and sister; Asthma in her mother and sister; Cancer in her father and mother; Depression in her father; Diabetes in her father, mother, paternal uncle, and sister; Endometriosis in her sister; Hypertension in her brother, father, and mother; Uterine cancer (age of onset: 48) in her mother.      No Known Allergies   Current Outpatient Medications:  .  escitalopram (LEXAPRO) 5 MG tablet, Take 1 tablet (5 mg total) by mouth daily., Disp: 90 tablet, Rfl: 1 .  ferrous sulfate 325 (65 FE) MG tablet, Take 1 tablet (325 mg total) by mouth daily with breakfast., Disp: 30 tablet, Rfl: 3 .  ibuprofen (ADVIL) 600 MG tablet, Take 1 tablet (600 mg total) by mouth every 6 (six) hours as needed., Disp: 30 tablet, Rfl: 0 .  lamoTRIgine (LAMICTAL) 100 MG tablet, Take 1 tablet (100 mg total) by mouth daily., Disp: 90 tablet, Rfl: 1 .  Prenatal Vit-Fe Fumarate-FA (PNV PRENATAL PLUS MULTIVITAMIN) 27-1 MG TABS, Take 1 tablet by mouth daily., Disp: 30 tablet, Rfl: 11   Patient Care Team: Margaretann Loveless, PA-C as PCP - General (Family Medicine)    Objective:    Vitals: BP 128/82 (BP Location: Left Arm, Patient Position: Sitting, Cuff Size: Normal)   Pulse 85   Temp (!) 97.1 F (36.2 C) (Temporal)   Ht 5\' 3"  (1.6 m)   Wt 185 lb 12.8 oz (84.3 kg)   SpO2 98%   BMI 32.91 kg/m    Vitals:   05/12/19 1341  BP: 128/82  Pulse: 85  Temp: (!) 97.1 F (36.2 C)  TempSrc: Temporal  SpO2: 98%  Weight:  185 lb 12.8 oz (84.3 kg)  Height: 5\' 3"  (1.6 m)     Physical Exam Vitals reviewed.  Constitutional:      General: She is not in acute distress.    Appearance: Normal appearance. She is well-developed. She is obese. She is not ill-appearing or diaphoretic.  HENT:     Head: Normocephalic and atraumatic.     Right Ear: Tympanic membrane, ear canal and external ear normal.     Left Ear: Tympanic membrane, ear canal and external ear normal.  Eyes:     General: No scleral icterus.       Right eye: No discharge.  Left eye: No discharge.     Extraocular Movements: Extraocular movements intact.     Conjunctiva/sclera: Conjunctivae normal.     Pupils: Pupils are equal, round, and reactive to light.  Neck:     Thyroid: No thyromegaly.     Vascular: No JVD.     Trachea: No tracheal deviation.  Cardiovascular:     Rate and Rhythm: Normal rate and regular rhythm.     Pulses: Normal pulses.     Heart sounds: Normal heart sounds. No murmur. No friction rub. No gallop.   Pulmonary:     Effort: Pulmonary effort is normal. No respiratory distress.     Breath sounds: Normal breath sounds. No wheezing or rales.  Chest:     Chest wall: No tenderness.  Abdominal:     General: Abdomen is flat. Bowel sounds are normal. There is no distension.     Palpations: Abdomen is soft. There is no mass.     Tenderness: There is no abdominal tenderness. There is no guarding or rebound.  Musculoskeletal:        General: No tenderness. Normal range of motion.     Cervical back: Normal range of motion and neck supple.     Right lower leg: No edema.     Left lower leg: No edema.  Lymphadenopathy:     Cervical: No cervical adenopathy.  Skin:    General: Skin is warm and dry.     Capillary Refill: Capillary refill takes less than 2 seconds.     Findings: No rash.  Neurological:     General: No focal deficit present.     Mental Status: She is alert and oriented to person, place, and time. Mental status  is at baseline.  Psychiatric:        Attention and Perception: Attention and perception normal.        Mood and Affect: Affect normal. Mood is anxious.        Speech: Speech is rapid and pressured.        Behavior: Behavior is hyperactive. Behavior is cooperative.        Thought Content: Thought content normal.        Cognition and Memory: Cognition and memory normal.        Judgment: Judgment normal.      Depression Screen PHQ 2/9 Scores 09/22/2018 06/17/2017 02/26/2017 01/29/2017  PHQ - 2 Score 6 3 5 3   PHQ- 9 Score 22 17 18 8   Exception Documentation - - - -       Assessment & Plan:     Routine Health Maintenance and Physical Exam  Exercise Activities and Dietary recommendations Goals   None     Immunization History  Administered Date(s) Administered  . Influenza,inj,Quad PF,6+ Mos 01/29/2017, 11/25/2017, 12/30/2018  . Rho (D) Immune Globulin 11/25/2017  . Tdap 05/12/2014, 09/23/2017, 01/27/2019    Health Maintenance  Topic Date Due  . URINE MICROALBUMIN  Never done  . PAP-Cervical Cytology Screening  01/30/2019  . PAP SMEAR-Modifier  11/22/2019  . TETANUS/TDAP  01/26/2029  . INFLUENZA VACCINE  Completed  . HIV Screening  Completed     Discussed health benefits of physical activity, and encouraged her to engage in regular exercise appropriate for her age and condition.    1. Annual physical exam Normal physical exam today. Will check labs as below and f/u pending lab results. If labs are stable and WNL she will not need to have these rechecked for one year at  her next annual physical exam. She is to call the office in the meantime if she has any acute issue, questions or concerns. - CBC w/Diff/Platelet - Comprehensive Metabolic Panel (CMET) - T4 AND TSH - Lipid Panel With LDL/HDL Ratio - Vitamin D (25 hydroxy)  2. Class 1 obesity due to excess calories with serious comorbidity and body mass index (BMI) of 32.0 to 32.9 in adult Counseled patient on healthy  lifestyle modifications including dieting and exercise.  Will check labs as below and f/u pending results. - Lipid Panel With LDL/HDL Ratio - Vitamin D (25 hydroxy)  3. Attention deficit hyperactivity disorder (ADHD), combined type Has been on ADHD and had good response but patient reports she did not like certain side effects. Would like to try Vyvanse, her son is on Vyvanse and it works well. Vyvanse ordered as below. Return in 4 weeks for medication f/u.  - lisdexamfetamine (VYVANSE) 20 MG capsule; Take 1 capsule (20 mg total) by mouth daily.  Dispense: 30 capsule; Refill: 0 - Vitamin D (25 hydroxy)  4. Bipolar 2 disorder ([place]) Restarting medications since she is no longer pregnant.  - lamoTRIgine (LAMICTAL) 100 MG tablet; Take 1 tablet (100 mg total) by mouth daily.  Dispense: 90 tablet; Refill: 1 - escitalopram (LEXAPRO) 10 MG tablet; Take 1 tablet (10 mg total) by mouth daily.  Dispense: 90 tablet; Refill: 1 - Vitamin D (25 hydroxy)  5. Postpartum follow-up Will check labs as below and f/u pending results. - Vitamin D (25 hydroxy)  --------------------------------------------------------------------    Mar Daring, PA-C  [facility]

## 2019-05-10 NOTE — Progress Notes (Signed)
TELEHEALTH VIRTUAL POSTPARTUM VISIT ENCOUNTER NOTE Patient name: [name] [name] MRN [id number]  Date of birth: 03-26-1990  I connected with patient on 05/10/19 at  2:10 PM EDT by MyChart video  and verified that I am speaking with the correct person using two identifiers. Due to COVID-19 recommendations, pt is not currently in our office.    I discussed the limitations, risks, security and privacy concerns of performing an evaluation and management service by telephone and the availability of in person appointments. I also discussed with the patient that there may be a patient responsible charge related to this service. The patient expressed understanding and agreed to proceed.  Chief Complaint:   Postpartum Care  History of Present Illness:   [name] [name] is a 29 y.o. [phone] Caucasian female being evaluated today for a postpartum visit. She is 5 weeks postpartum following a spontaneous vaginal delivery at 39.6 gestational weeks. Anesthesia: none. Laceration: none. I have fully reviewed the prenatal and intrapartum course. Pregnancy complicated by A1DM. Postpartum course has been uncomplicated. Bleeding thin lochia. Bowel function is normal. Bladder function is normal.  Patient is not sexually active. Contraception method is Nexplanon. Inserted 04/02/19 Last pap 11/24/16.  Results were normal .  No LMP recorded.  Baby's course has been uncomplicated. Baby is feeding by bottle   Edinburgh Postpartum Depression Screening: negative Edinburgh Postnatal Depression Scale - 05/10/19 1405      Edinburgh Postnatal Depression Scale:  In the Past 7 Days   I have been able to laugh and see the funny side of things.  1    I have looked forward with enjoyment to things.  0    I have blamed myself unnecessarily when things went wrong.  0    I have been anxious or worried for no good reason.  0    I have felt scared or panicky for no good reason.  0    Things have been getting on top of me.  0    I  have been so unhappy that I have had difficulty sleeping.  0    I have felt sad or miserable.  1    I have been so unhappy that I have been crying.  1    The thought of harming myself has occurred to me.  0    Edinburgh Postnatal Depression Scale Total  3      Review of Systems:   Pertinent items are noted in HPI Denies Abnormal vaginal discharge w/ itching/odor/irritation, headaches, visual changes, shortness of breath, chest pain, abdominal pain, severe nausea/vomiting, or problems with urination or bowel movements. Pertinent History Reviewed:  Reviewed past medical,surgical, obstetrical and family history.  Reviewed problem list, medications and allergies. OB History  Gravida Para Term Preterm AB Living  4 3 3   1 3   SAB TAB Ectopic Multiple Live Births  1     0 3    # Outcome Date GA Lbr Len/2nd Weight Sex Delivery Anes PTL Lv  4 Term 04/01/19 [redacted]w[redacted]d 06:39 / 00:04 8 lb 3.4 oz (3.725 kg) M Vag-Spont None  LIV  3 Term 12/18/17 [redacted]w[redacted]d / 00:09 7 lb 6.9 oz (3.37 kg) M Vag-Spont EPI  LIV  2 SAB 11/2016          1 Term 10/18/10 [redacted]w[redacted]d  6 lb 1 oz (2.75 kg) M Vag-Spont EPI N LIV   Physical Assessment:  There were no vitals filed for this visit.There is no height or weight on file to  calculate BMI.       Physical Examination:  General:  Alert, oriented and cooperative.   Mental Status: Normal mood and affect perceived. Normal judgment and thought content.  Rest of physical exam deferred due to type of encounter       No results found for this or any previous visit (from the past 24 hour(s)).  Assessment & Plan:  1) Postpartum exam 2) 5 wks s/p SVB 3) Bottlefeeding 4) Depression screening 5) Contraception: s/p Nexplanon insertion  6) A1DM during pregnancy> schedule 2hr GTT  Meds: No orders of the defined types were placed in this encounter.   I discussed the assessment and treatment plan with the patient. The patient was provided an opportunity to ask questions and all were  answered. The patient agreed with the plan and demonstrated an understanding of the instructions.   The patient was advised to call back or seek an in-person evaluation/go to the ED for any concerning postpartum symptoms.  I provided 15 minutes of non-face-to-face time during this encounter.  Follow-up: Return in about 4 weeks (around 06/07/2019) for pp 2hr GTT, then in Oct for pap & physical.   No orders of the defined types were placed in this encounter.   [place], [facility] 05/10/2019 2:25 PM

## 2019-05-12 ENCOUNTER — Other Ambulatory Visit: Payer: Self-pay

## 2019-05-12 ENCOUNTER — Ambulatory Visit: Payer: Medicaid Other | Admitting: Physician Assistant

## 2019-05-12 ENCOUNTER — Encounter: Payer: Self-pay | Admitting: Physician Assistant

## 2019-05-12 VITALS — BP 128/82 | HR 85 | Temp 97.1°F | Ht 63.0 in | Wt 185.8 lb

## 2019-05-12 DIAGNOSIS — E6609 Other obesity due to excess calories: Secondary | ICD-10-CM

## 2019-05-12 DIAGNOSIS — F3181 Bipolar II disorder: Secondary | ICD-10-CM

## 2019-05-12 DIAGNOSIS — F902 Attention-deficit hyperactivity disorder, combined type: Secondary | ICD-10-CM | POA: Diagnosis not present

## 2019-05-12 DIAGNOSIS — Z0000 Encounter for general adult medical examination without abnormal findings: Secondary | ICD-10-CM | POA: Diagnosis not present

## 2019-05-12 DIAGNOSIS — Z6832 Body mass index (BMI) 32.0-32.9, adult: Secondary | ICD-10-CM | POA: Diagnosis not present

## 2019-05-12 MED ORDER — ESCITALOPRAM OXALATE 10 MG PO TABS: 10.0000 mg | ORAL_TABLET | Freq: Every day | ORAL | 1 refills | Status: DC

## 2019-05-12 MED ORDER — LAMOTRIGINE 100 MG PO TABS: 100.0000 mg | ORAL_TABLET | Freq: Every day | ORAL | 1 refills | Status: DC

## 2019-05-12 MED ORDER — LISDEXAMFETAMINE DIMESYLATE 20 MG PO CAPS: 20.0000 mg | ORAL_CAPSULE | Freq: Every day | ORAL | 0 refills | Status: DC

## 2019-05-12 NOTE — Patient Instructions (Signed)
Health Maintenance, Female Adopting a healthy lifestyle and getting preventive care are important in promoting health and wellness. Ask your health care provider about:  The right schedule for you to have regular tests and exams.  Things you can do on your own to prevent diseases and keep yourself healthy. What should I know about diet, weight, and exercise? Eat a healthy diet   Eat a diet that includes plenty of vegetables, fruits, low-fat dairy products, and lean protein.  Do not eat a lot of foods that are high in solid fats, added sugars, or sodium. Maintain a healthy weight Body mass index (BMI) is used to identify weight problems. It estimates body fat based on height and weight. Your health care provider can help determine your BMI and help you achieve or maintain a healthy weight. Get regular exercise Get regular exercise. This is one of the most important things you can do for your health. Most adults should:  Exercise for at least 150 minutes each week. The exercise should increase your heart rate and make you sweat (moderate-intensity exercise).  Do strengthening exercises at least twice a week. This is in addition to the moderate-intensity exercise.  Spend less time sitting. Even light physical activity can be beneficial. Watch cholesterol and blood lipids Have your blood tested for lipids and cholesterol at 30 years of age, then have this test every 5 years. Have your cholesterol levels checked more often if:  Your lipid or cholesterol levels are high.  You are older than 30 years of age.  You are at high risk for heart disease. What should I know about cancer screening? Depending on your health history and family history, you may need to have cancer screening at various ages. This may include screening for:  Breast cancer.  Cervical cancer.  Colorectal cancer.  Skin cancer.  Lung cancer. What should I know about heart disease, diabetes, and high blood  pressure? Blood pressure and heart disease  High blood pressure causes heart disease and increases the risk of stroke. This is more likely to develop in people who have high blood pressure readings, are of African descent, or are overweight.  Have your blood pressure checked: ? Every 3-5 years if you are 18-39 years of age. ? Every year if you are 40 years old or older. Diabetes Have regular diabetes screenings. This checks your fasting blood sugar level. Have the screening done:  Once every three years after age 40 if you are at a normal weight and have a low risk for diabetes.  More often and at a younger age if you are overweight or have a high risk for diabetes. What should I know about preventing infection? Hepatitis B If you have a higher risk for hepatitis B, you should be screened for this virus. Talk with your health care provider to find out if you are at risk for hepatitis B infection. Hepatitis C Testing is recommended for:  Everyone born from 1945 through 1965.  Anyone with known risk factors for hepatitis C. Sexually transmitted infections (STIs)  Get screened for STIs, including gonorrhea and chlamydia, if: ? You are sexually active and are younger than 30 years of age. ? You are older than 30 years of age and your health care provider tells you that you are at risk for this type of infection. ? Your sexual activity has changed since you were last screened, and you are at increased risk for chlamydia or gonorrhea. Ask your health care provider if   you are at risk.  Ask your health care provider about whether you are at high risk for HIV. Your health care provider may recommend a prescription medicine to help prevent HIV infection. If you choose to take medicine to prevent HIV, you should first get tested for HIV. You should then be tested every 3 months for as long as you are taking the medicine. Pregnancy  If you are about to stop having your period (premenopausal) and  you may become pregnant, seek counseling before you get pregnant.  Take 400 to 800 micrograms (mcg) of folic acid every day if you become pregnant.  Ask for birth control (contraception) if you want to prevent pregnancy. Osteoporosis and menopause Osteoporosis is a disease in which the bones lose minerals and strength with aging. This can result in bone fractures. If you are 65 years old or older, or if you are at risk for osteoporosis and fractures, ask your health care provider if you should:  Be screened for bone loss.  Take a calcium or vitamin D supplement to lower your risk of fractures.  Be given hormone replacement therapy (HRT) to treat symptoms of menopause. Follow these instructions at home: Lifestyle  Do not use any products that contain nicotine or tobacco, such as cigarettes, e-cigarettes, and chewing tobacco. If you need help quitting, ask your health care provider.  Do not use street drugs.  Do not share needles.  Ask your health care provider for help if you need support or information about quitting drugs. Alcohol use  Do not drink alcohol if: ? Your health care provider tells you not to drink. ? You are pregnant, may be pregnant, or are planning to become pregnant.  If you drink alcohol: ? Limit how much you use to 0-1 drink a day. ? Limit intake if you are breastfeeding.  Be aware of how much alcohol is in your drink. In the [place] one drink equals one 12 oz bottle of beer (355 mL), one 5 oz glass of wine (148 mL), or one 1 oz glass of hard liquor (44 mL). General instructions  Schedule regular health, dental, and eye exams.  Stay current with your vaccines.  Tell your health care provider if: ? You often feel depressed. ? You have ever been abused or do not feel safe at home. Summary  Adopting a healthy lifestyle and getting preventive care are important in promoting health and wellness.  Follow your health care provider's instructions about healthy  diet, exercising, and getting tested or screened for diseases.  Follow your health care provider's instructions on monitoring your cholesterol and blood pressure. This information is not intended to replace advice given to you by your health care provider. Make sure you discuss any questions you have with your health care provider. Document Revised: 01/27/2018 Document Reviewed: 01/27/2018 Elsevier Patient Education  2020 [employer]vier Inc[employer]  

## 2019-05-13 ENCOUNTER — Telehealth: Payer: Self-pay

## 2019-05-13 LAB — LIPID PANEL WITH LDL/HDL RATIO
Cholesterol, Total: 175 mg/dL (ref 100–199)
HDL: 46 mg/dL (ref >39)
LDL Chol Calc (NIH): 107 mg/dL — ABNORMAL HIGH (ref 0–99)
LDL/HDL Ratio: 2.3 ratio (ref 0.0–3.2)
Triglycerides: 125 mg/dL (ref 0–149)
VLDL Cholesterol Cal: 22 mg/dL (ref 5–40)

## 2019-05-13 LAB — CBC WITH DIFFERENTIAL/PLATELET
Basophils Absolute: 0 10*3/uL (ref 0.0–0.2)
Basos: 0 %
EOS (ABSOLUTE): 0.3 10*3/uL (ref 0.0–0.4)
Eos: 3 %
Hematocrit: 38.9 % (ref 34.0–46.6)
Hemoglobin: 12.3 g/dL (ref 11.1–15.9)
Immature Grans (Abs): 0 10*3/uL (ref 0.0–0.1)
Immature Granulocytes: 0 %
Lymphocytes Absolute: 3.5 10*3/uL — ABNORMAL HIGH (ref 0.7–3.1)
Lymphs: 41 %
MCH: 24.3 pg — ABNORMAL LOW (ref 26.6–33.0)
MCHC: 31.6 g/dL (ref 31.5–35.7)
MCV: 77 fL — ABNORMAL LOW (ref 79–97)
Monocytes Absolute: 0.3 10*3/uL (ref 0.1–0.9)
Monocytes: 4 %
Neutrophils Absolute: 4.4 10*3/uL (ref 1.4–7.0)
Neutrophils: 52 %
Platelets: 234 10*3/uL (ref 150–450)
RBC: 5.06 x10E6/uL (ref 3.77–5.28)
RDW: 15.6 % — ABNORMAL HIGH (ref 11.7–15.4)
WBC: 8.6 10*3/uL (ref 3.4–10.8)

## 2019-05-13 LAB — COMPREHENSIVE METABOLIC PANEL
ALT: 21 IU/L (ref 0–32)
AST: 20 IU/L (ref 0–40)
Albumin/Globulin Ratio: 2 (ref 1.2–2.2)
Albumin: 4.5 g/dL (ref 3.9–5.0)
Alkaline Phosphatase: 108 IU/L (ref 39–117)
BUN/Creatinine Ratio: 9 (ref 9–23)
BUN: 7 mg/dL (ref 6–20)
Bilirubin Total: <0.2 mg/dL (ref 0.0–1.2)
CO2: 23 mmol/L (ref 20–29)
Calcium: 9.2 mg/dL (ref 8.7–10.2)
Chloride: 105 mmol/L (ref 96–106)
Creatinine, Ser: 0.79 mg/dL (ref 0.57–1.00)
GFR calc Af Amer: 117 mL/min/{1.73_m2} (ref >59)
GFR calc non Af Amer: 101 mL/min/{1.73_m2} (ref >59)
Globulin, Total: 2.2 g/dL (ref 1.5–4.5)
Glucose: 82 mg/dL (ref 65–99)
Potassium: 3.9 mmol/L (ref 3.5–5.2)
Sodium: 142 mmol/L (ref 134–144)
Total Protein: 6.7 g/dL (ref 6.0–8.5)

## 2019-05-13 LAB — T4 AND TSH
T4, Total: 7.2 ug/dL (ref 4.5–12.0)
TSH: 0.767 u[IU]/mL (ref 0.450–4.500)

## 2019-05-13 LAB — VITAMIN D 25 HYDROXY (VIT D DEFICIENCY, FRACTURES): Vit D, 25-Hydroxy: 30.1 ng/mL (ref 30.0–100.0)

## 2019-05-13 NOTE — Telephone Encounter (Signed)
-----   Message from Margaretann Loveless, [place] sent at 05/13/2019  7:40 AM EDT ----- Blood count is normal. Kidney and liver function are normal. Sodium, potassium, and calcium are normal. Sugar is normal. Thyroid is normal. Cholesterol is normal. Vit D is normal, but low normal.  May benefit from OTC Vit D 1000 IU daily.

## 2019-05-13 NOTE — Telephone Encounter (Signed)
No answer. If patient calls back ok for the P[facility] to give results.

## 2019-05-16 ENCOUNTER — Encounter: Payer: Self-pay | Admitting: Physician Assistant

## 2019-05-17 NOTE — Telephone Encounter (Signed)
Patient advised as directed below. 

## 2019-06-07 ENCOUNTER — Other Ambulatory Visit: Payer: Medicaid Other

## 2019-06-07 ENCOUNTER — Encounter: Payer: Self-pay | Admitting: Family Medicine

## 2019-06-07 ENCOUNTER — Ambulatory Visit (INDEPENDENT_AMBULATORY_CARE_PROVIDER_SITE_OTHER): Payer: Medicaid Other | Admitting: Family Medicine

## 2019-06-07 VITALS — Temp 99.0°F

## 2019-06-07 DIAGNOSIS — Z8632 Personal history of gestational diabetes: Secondary | ICD-10-CM

## 2019-06-07 DIAGNOSIS — J029 Acute pharyngitis, unspecified: Secondary | ICD-10-CM

## 2019-06-07 NOTE — Progress Notes (Signed)
Virtual telephone visit    Virtual Visit via Telephone Note   This visit type was conducted due to national recommendations for restrictions regarding the COVID-19 Pandemic (e.g. social distancing) in an effort to limit this patient's exposure and mitigate transmission in our community. Due to her co-morbid illnesses, this patient is at least at moderate risk for complications without adequate follow up. This format is felt to be most appropriate for this patient at this time. The patient did not have access to video technology or had technical difficulties with video requiring transitioning to audio format only (telephone). Physical exam was limited to content and character of the telephone converstion.     Patient location: home Provider location: [facility] Persons involved in the visit: patient, provider   Patient: [name] [name]   DOB: [birth year]   28 y.o. Female  MRN: [id number] Visit Date: 06/07/2019  Today's Provider: Shirlee Latch, MD  Subjective:    Chief Complaint  Patient presents with  . Sore Throat   HPI   Micah Flesher out last night (to the mall) for the first time in 1 yr No headache, fever, fatigue, drainage, or other symptoms Has h/o strep throat prior to tonsillectomy This feels similar to previous episodes No known sick contacts, but does have 2 kids under 2 and worried about exposing them Started this AM  Taking antihistamine with no relief    Social History   Tobacco Use  . Smoking status: Current Every Day Smoker    Packs/day: 0.50    Types: Cigarettes    Start date: 09/06/2009  . Smokeless tobacco: Never Used  . Tobacco comment: 2-3 cigs per day  Substance Use Topics  . Alcohol use: Not Currently    Alcohol/week: 4.0 standard drinks    Types: 4 Glasses of wine per week  . Drug use: No      Medications: Outpatient Medications Prior to Visit  Medication Sig  . escitalopram (LEXAPRO) 10 MG tablet Take 1 tablet (10 mg total)  by mouth daily.  . ferrous sulfate 325 (65 FE) MG tablet Take 1 tablet (325 mg total) by mouth daily with breakfast.  . ibuprofen (ADVIL) 600 MG tablet Take 1 tablet (600 mg total) by mouth every 6 (six) hours as needed.  . lamoTRIgine (LAMICTAL) 100 MG tablet Take 1 tablet (100 mg total) by mouth daily.  Marland Kitchen lisdexamfetamine (VYVANSE) 20 MG capsule Take 1 capsule (20 mg total) by mouth daily.  . Prenatal Vit-Fe Fumarate-FA (PNV PRENATAL PLUS MULTIVITAMIN) 27-1 MG TABS Take 1 tablet by mouth daily.   No facility-administered medications prior to visit.    Review of Systems  Constitutional: Negative for chills, fatigue and fever.  HENT: Positive for sore throat. Negative for ear pain, postnasal drip, rhinorrhea and sinus pain.   Eyes: Negative.   Respiratory: Negative.   Cardiovascular: Negative.   Gastrointestinal: Positive for nausea (Throat is swollen and causes her to gag. ). Negative for diarrhea.  Endocrine: Negative.   Genitourinary: Negative.   Musculoskeletal: Negative.   Skin: Negative.   Allergic/Immunologic: Negative.   Neurological: Negative.   Hematological: Negative.   Psychiatric/Behavioral: Negative.          Objective:    Temp 99 F (37.2 C) (Temporal)  BP Readings from Last 3 Encounters:  05/12/19 128/82  04/02/19 (!) 91/54  03/25/19 116/71   Wt Readings from Last 3 Encounters:  05/12/19 185 lb 12.8 oz (84.3 kg)  04/01/19 193 lb (87.5 kg)  03/25/19 195  lb (88.5 kg)    Speaking in full sentences in NAD      Assessment & Plan:    I discussed the assessment and treatment plan with the patient. The patient was provided an opportunity to ask questions and all were answered. The patient agreed with the plan and demonstrated an understanding of the instructions.   The patient was advised to call back or seek an in-person evaluation if the symptoms worsen or if the condition fails to improve as anticipated.  1. Sore throat - new problem for <1 day -  discussed differential diagnosis, including COVID19 or other virus, seasonal allergies, and strep throat - unable to swab for strep given current pandemic - continue antihistamine - get COVID test - if COVID test negative and sore throat persists, may call back and will consider empiric treatment for possible Strep pharyngitis    Followup as needed - return precautions discussed  The entirety of the information documented in the History of Present Illness, Review of Systems and Physical Exam were personally obtained by me. Portions of this information were initially documented by [facility], CMA and reviewed by me for thoroughness and accuracy.    Kiarah Eckstein, Dionne Bucy, MD MPH [facility]

## 2019-06-07 NOTE — Progress Notes (Signed)
MyChart Video Visit    Virtual Visit via Video Note   This visit type was conducted due to national recommendations for restrictions regarding the COVID-19 Pandemic (e.g. social distancing) in an effort to limit this patient's exposure and mitigate transmission in our community. This patient is at least at moderate risk for complications without adequate follow up. This format is felt to be most appropriate for this patient at this time. Physical exam was limited by quality of the video and audio technology used for the visit.   Patient location: Home Provider location: [facility]   Patient: [name] [name]   DOB: Nov 24, 1990   28 y.o. Female  MRN: [id number] Visit Date: 06/09/2019  Today's Provider: Margaretann Loveless, PA-C  Subjective:    Chief Complaint  Patient presents with  . Follow-up    ADHD and Bipolar 2 Disorder   HPI Follow up for ADHD and Bipolar 2 disorder  The patient was last seen for this 4 weeks ago. Changes made at last visit include changed from Adderall to Vyvanse and restarted Lamictal. Reports that she is still taking the Adderall and will start the Vyvanse until she finish the old bottle of Adderall. She has 14 tabs of adderall left. Will start Vyvanse then.  She reports excellent compliance with treatment. She feels that condition is Improved. She is not having side effects. With the Lamictal.  -----------------------------------------------------------------------------------------   Patient Active Problem List   Diagnosis Date Noted  . Nexplanon insertion 05/10/2019  . History of gestational diabetes 12/31/2018  . GERD (gastroesophageal reflux disease) 12/02/2017  . De Quervain's tenosynovitis, right 02/28/2016  . Antalgic gait 11/02/2015  . Attention deficit hyperactivity disorder (ADHD), predominantly inattentive type 10/12/2014  . 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 Bipolar 2 disorder (HCC) 09/08/2014  . Affective disorder (HCC)  09/07/2014   Past Medical History:  Diagnosis Date  . ADD (attention deficit disorder)   . ADHD (attention deficit hyperactivity disorder)   . Anxiety   . Bipolar disorder (HCC)   . Depression    manid depressive w/ generalized anxiety  . Frequent UTI   . PMDD (premenstrual dysphoric disorder)    No Known Allergies  Medications: Outpatient Medications Prior to Visit  Medication Sig  . escitalopram (LEXAPRO) 10 MG tablet Take 1 tablet (10 mg total) by mouth daily.  Marland Kitchen lamoTRIgine (LAMICTAL) 100 MG tablet Take 1 tablet (100 mg total) by mouth daily.  . Prenatal Vit-Fe Fumarate-FA (PNV PRENATAL PLUS MULTIVITAMIN) 27-1 MG TABS Take 1 tablet by mouth daily.  . ferrous sulfate 325 (65 FE) MG tablet Take 1 tablet (325 mg total) by mouth daily with breakfast. (Patient not taking: Reported on 06/09/2019)  . ibuprofen (ADVIL) 600 MG tablet Take 1 tablet (600 mg total) by mouth every 6 (six) hours as needed.  Marland Kitchen lisdexamfetamine (VYVANSE) 20 MG capsule Take 1 capsule (20 mg total) by mouth daily. (Patient not taking: Reported on 06/09/2019)   No facility-administered medications prior to visit.    Last CBC Lab Results  Component Value Date   WBC 8.6 05/12/2019   HGB 12.3 05/12/2019   HCT 38.9 05/12/2019   MCV 77 (L) 05/12/2019   MCH 24.3 (L) 05/12/2019   RDW 15.6 (H) 05/12/2019   PLT 234 05/12/2019   Last metabolic panel Lab Results  Component Value Date   GLUCOSE 82 05/12/2019   NA 142 05/12/2019   K 3.9 05/12/2019   CL 105 05/12/2019   CO2 23 05/12/2019  BUN 7 05/12/2019   CREATININE 0.79 05/12/2019   GFRNONAA 101 05/12/2019   GFRAA 117 05/12/2019   CALCIUM 9.2 05/12/2019   PROT 6.7 05/12/2019   ALBUMIN 4.5 05/12/2019   LABGLOB 2.2 05/12/2019   AGRATIO 2.0 05/12/2019   BILITOT <0.2 05/12/2019   ALKPHOS 108 05/12/2019   AST 20 05/12/2019   ALT 21 05/12/2019   ANIONGAP 6 05/13/2014   Last hemoglobin A1c Lab Results  Component Value Date   HGBA1C 5.0 01/30/2016    Last thyroid functions Lab Results  Component Value Date   TSH 0.767 05/12/2019   T4TOTAL 7.2 05/12/2019      Review of Systems       Objective:    Wt 183 lb (83 kg)   BMI 32.42 kg/m  BP Readings from Last 3 Encounters:  05/12/19 128/82  04/02/19 (!) 91/54  03/25/19 116/71   Wt Readings from Last 3 Encounters:  06/09/19 183 lb (83 kg)  05/12/19 185 lb 12.8 oz (84.3 kg)  04/01/19 193 lb (87.5 kg)      Physical Exam Vitals reviewed.  Constitutional:      General: She is not in acute distress. Pulmonary:     Effort: No respiratory distress.  Neurological:     Mental Status: She is alert.  Psychiatric:        Mood and Affect: Mood normal.        Thought Content: Thought content normal.          Assessment & Plan:    1. Gastroesophageal reflux disease without esophagitis Patient reports having breakthrough symptoms, but reports she knows it is because she is eating poorly. Eating more fast foods. Does use omeprazole 20mg  OTC prn with severe symptoms.   2. Bipolar 2 disorder ([place]) Stable currently. Continue Lamictal 100mg  and Lexapro 10mg .   3. Attention deficit hyperactivity disorder (ADHD), predominantly inattentive type Currently using old Adderall Rx to finish. Has 14 days left. Will start vyvanse then. Will f/u in 6 weeks for recheck on how she is tolerating Vyvanse and dose adjust as needed.    No follow-ups on file.     I discussed the assessment and treatment plan with the patient. The patient was provided an opportunity to ask questions and all were answered. The patient agreed with the plan and demonstrated an understanding of the instructions.   The patient was advised to call back or seek an in-person evaluation if the symptoms worsen or if the condition fails to improve as anticipated.  I provided 11 minutes of non-face-to-face time during this encounter.  Reynolds Bowl, PA-C, have reviewed all documentation for this visit. The  documentation on 06/09/19 for the exam, diagnosis, procedures, and orders are all accurate and complete.  [place] Beach [facility] [phone] (phone) [phone] (fax)  [place]

## 2019-06-07 NOTE — Patient Instructions (Signed)

## 2019-06-09 ENCOUNTER — Telehealth (INDEPENDENT_AMBULATORY_CARE_PROVIDER_SITE_OTHER): Payer: Medicaid Other | Admitting: Physician Assistant

## 2019-06-09 ENCOUNTER — Encounter: Payer: Self-pay | Admitting: Physician Assistant

## 2019-06-09 VITALS — Wt 183.0 lb

## 2019-06-09 DIAGNOSIS — K219 Gastro-esophageal reflux disease without esophagitis: Secondary | ICD-10-CM | POA: Diagnosis not present

## 2019-06-09 DIAGNOSIS — F900 Attention-deficit hyperactivity disorder, predominantly inattentive type: Secondary | ICD-10-CM | POA: Diagnosis not present

## 2019-06-09 DIAGNOSIS — F3181 Bipolar II disorder: Secondary | ICD-10-CM

## 2019-07-21 ENCOUNTER — Telehealth: Payer: Self-pay

## 2019-07-21 ENCOUNTER — Ambulatory Visit (INDEPENDENT_AMBULATORY_CARE_PROVIDER_SITE_OTHER): Payer: Medicaid Other | Admitting: Physician Assistant

## 2019-07-21 ENCOUNTER — Encounter: Payer: Self-pay | Admitting: Physician Assistant

## 2019-07-21 DIAGNOSIS — F39 Unspecified mood [affective] disorder: Secondary | ICD-10-CM | POA: Diagnosis not present

## 2019-07-21 DIAGNOSIS — F900 Attention-deficit hyperactivity disorder, predominantly inattentive type: Secondary | ICD-10-CM

## 2019-07-21 DIAGNOSIS — F3181 Bipolar II disorder: Secondary | ICD-10-CM

## 2019-07-21 MED ORDER — LAMOTRIGINE 200 MG PO TABS: 200.0000 mg | ORAL_TABLET | Freq: Every day | ORAL | 1 refills | Status: DC

## 2019-07-21 MED ORDER — ESCITALOPRAM OXALATE 20 MG PO TABS: 20.0000 mg | ORAL_TABLET | Freq: Every day | ORAL | 0 refills | Status: DC

## 2019-07-21 MED ORDER — ATOMOXETINE HCL 10 MG PO CAPS: 10.0000 mg | ORAL_CAPSULE | Freq: Two times a day (BID) | ORAL | 0 refills | Status: DC

## 2019-07-21 NOTE — Progress Notes (Signed)
Virtual telephone visit    Virtual Visit via Telephone Note   This visit type was conducted due to national recommendations for restrictions regarding the COVID-19 Pandemic (e.g. social distancing) in an effort to limit this patient's exposure and mitigate transmission in our community. Due to her co-morbid illnesses, this patient is at least at moderate risk for complications without adequate follow up. This format is felt to be most appropriate for this patient at this time. The patient did not have access to video technology or had technical difficulties with video requiring transitioning to audio format only (telephone). Physical exam was limited to content and character of the telephone converstion.    Patient location: Home Provider location: [facility]   Visit Date: 07/21/2019  Today's healthcare provider: Margaretann Loveless, PA-C   Chief Complaint  Patient presents with  . Follow-up    ADHD   Subjective    HPI Follow up for ADHD  The patient was last seen for this 6 weeks ago. Changes made at last visit include Finish Adderall and start vyvanse then.  She reports good compliance with treatment. She feels that condition is Unchanged. She is having side effects. Reports that she gets really jittery. Feels like she is angry.  Feels like she is having worse depression, irritability right now. Reports her kids make her happy but otherwise she is not. Has to live in more depressed state because she feels happy when she is manic. She knows that her manic episodes affect other people so she avoids them and continues to live more flat and depressed.  She has seen counselors and psychiatrist. Declines wanting to return due to cost.  -----------------------------------------------------------------------------------------   Patient Active Problem List   Diagnosis Date Noted  . Nexplanon insertion 05/10/2019  . History of gestational diabetes 12/31/2018  . GERD  (gastroesophageal reflux disease) 12/02/2017  . De Quervain's tenosynovitis, right 02/28/2016  . Antalgic gait 11/02/2015  . Attention deficit hyperactivity disorder (ADHD), predominantly inattentive type 10/12/2014  . 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 Bipolar 2 disorder (HCC) 09/08/2014  . Affective disorder (HCC) 09/07/2014   Past Medical History:  Diagnosis Date  . ADD (attention deficit disorder)   . ADHD (attention deficit hyperactivity disorder)   . Anxiety   . Bipolar disorder (HCC)   . Depression    manid depressive w/ generalized anxiety  . Frequent UTI   . PMDD (premenstrual dysphoric disorder)       Medications: Outpatient Medications Prior to Visit  Medication Sig  . escitalopram (LEXAPRO) 10 MG tablet Take 1 tablet (10 mg total) by mouth daily.  Marland Kitchen lamoTRIgine (LAMICTAL) 100 MG tablet Take 1 tablet (100 mg total) by mouth daily.  . ferrous sulfate 325 (65 FE) MG tablet Take 1 tablet (325 mg total) by mouth daily with breakfast. (Patient not taking: Reported on 06/09/2019)  . lisdexamfetamine (VYVANSE) 20 MG capsule Take 1 capsule (20 mg total) by mouth daily. (Patient not taking: Reported on 06/09/2019)  . Prenatal Vit-Fe Fumarate-FA (PNV PRENATAL PLUS MULTIVITAMIN) 27-1 MG TABS Take 1 tablet by mouth daily.   No facility-administered medications prior to visit.    Review of Systems  Constitutional: Negative.   Respiratory: Negative.   Cardiovascular: Negative.   Psychiatric/Behavioral: Positive for agitation, decreased concentration and dysphoric mood. Negative for self-injury, sleep disturbance and suicidal ideas. The patient is nervous/anxious and is hyperactive.     Last CBC Lab Results  Component Value Date   WBC 8.6 05/12/2019  HGB 12.3 05/12/2019   HCT 38.9 05/12/2019   MCV 77 (L) 05/12/2019   MCH 24.3 (L) 05/12/2019   RDW 15.6 (H) 05/12/2019   PLT 234 55/73/2202   Last metabolic panel Lab Results  Component Value Date    GLUCOSE 82 05/12/2019   NA 142 05/12/2019   K 3.9 05/12/2019   CL 105 05/12/2019   CO2 23 05/12/2019   BUN 7 05/12/2019   CREATININE 0.79 05/12/2019   GFRNONAA 101 05/12/2019   GFRAA 117 05/12/2019   CALCIUM 9.2 05/12/2019   PROT 6.7 05/12/2019   ALBUMIN 4.5 05/12/2019   LABGLOB 2.2 05/12/2019   AGRATIO 2.0 05/12/2019   BILITOT <0.2 05/12/2019   ALKPHOS 108 05/12/2019   AST 20 05/12/2019   ALT 21 05/12/2019   ANIONGAP 6 05/13/2014      Objective    There were no vitals taken for this visit. BP Readings from Last 3 Encounters:  05/12/19 128/82  04/02/19 (!) 91/54  03/25/19 116/71   Wt Readings from Last 3 Encounters:  06/09/19 183 lb (83 kg)  05/12/19 185 lb 12.8 oz (84.3 kg)  04/01/19 193 lb (87.5 kg)        Assessment & Plan     1. Affective disorder ([place]) Increase escitalopram to 20mg  from 10mg . Increase Lamictal from 100mg  to 200mg . F/U in 4 weeks.  - escitalopram (LEXAPRO) 20 MG tablet; Take 1 tablet (20 mg total) by mouth daily.  Dispense: 90 tablet; Refill: 0 - lamoTRIgine (LAMICTAL) 200 MG tablet; Take 1 tablet (200 mg total) by mouth daily.  Dispense: 90 tablet; Refill: 1  2. Bipolar 2 disorder ([place]) See above medical treatment plan. - escitalopram (LEXAPRO) 20 MG tablet; Take 1 tablet (20 mg total) by mouth daily.  Dispense: 90 tablet; Refill: 0 - lamoTRIgine (LAMICTAL) 200 MG tablet; Take 1 tablet (200 mg total) by mouth daily.  Dispense: 90 tablet; Refill: 1  3. Attention deficit hyperactivity disorder (ADHD), predominantly inattentive type Has tried Adderall XR and Vyvanse. Stimulant based ADHD medications make her bipolar worse. Will try Strattera as below. F/U in 4 weeks.  - atomoxetine (STRATTERA) 10 MG capsule; Take 1 capsule (10 mg total) by mouth 2 (two) times daily with a meal.  Dispense: 60 capsule; Refill: 0   No follow-ups on file.    I discussed the assessment and treatment plan with the patient. The patient was provided an opportunity  to ask questions and all were answered. The patient agreed with the plan and demonstrated an understanding of the instructions.   The patient was advised to call back or seek an in-person evaluation if the symptoms worsen or if the condition fails to improve as anticipated.  I provided 28 minutes of non-face-to-face time during this encounter.  Reynolds Bowl, PA-C, have reviewed all documentation for this visit. The documentation on 07/21/19 for the exam, diagnosis, procedures, and orders are all accurate and complete.  [place] [facility] [phone] (phone) [phone] (fax)  [place]

## 2019-07-21 NOTE — Patient Instructions (Signed)
Atomoxetine capsules What is this medicine? ATOMOXETINE (AT oh mox e teen) is used to treat attention deficit/hyperactivity disorder, also known as ADHD. It is not a stimulant like other drugs for ADHD. This drug can improve attention span, concentration, and emotional control. It can also reduce restless or overactive behavior. This medicine may be used for other purposes; ask your health care provider or pharmacist if you have questions. COMMON BRAND NAME(S): Strattera What should I tell my health care provider before I take this medicine? They need to know if you have any of these conditions:  glaucoma  high or low blood pressure  history of stroke  irregular heartbeat or other cardiac disease  liver disease  mania or bipolar disorder  pheochromocytoma  suicidal thoughts  an unusual or allergic reaction to atomoxetine, other medicines, foods, dyes, or preservatives  pregnant or trying to get pregnant  breast-feeding How should I use this medicine? Take this medicine by mouth with a glass of water. Follow the directions on the prescription label. You can take it with or without food. If it upsets your stomach, take it with food. If you have difficulty sleeping and you take more than 1 dose per day, take your last dose before 6 PM. Take your medicine at regular intervals. Do not take it more often than directed. Do not stop taking except on your doctor's advice. A special MedGuide will be given to you by the pharmacist with each prescription and refill. Be sure to read this information carefully each time. Talk to your pediatrician regarding the use of this medicine in children. While this drug may be prescribed for children as young as 6 years for selected conditions, precautions do apply. Overdosage: If you think you have taken too much of this medicine contact a poison control center or emergency room at once. NOTE: This medicine is only for you. Do not share this medicine with  others. What if I miss a dose? If you miss a dose, take it as soon as you can. If it is almost time for your next dose, take only that dose. Do not take double or extra doses. What may interact with this medicine? Do not take this medicine with any of the following medications:  cisapride  dronedarone  MAOIs like Carbex, Eldepryl, Marplan, Nardil, and Parnate  pimozide  reboxetine  thioridazine This medicine may also interact with the following medications:  certain medicines for blood pressure, heart disease, irregular heart beat  certain medicines for depression, anxiety, or psychotic disturbances  certain medicines for lung disease like albuterol  cold or allergy medicines  dofetilide  fluoxetine  medicines that increase blood pressure like dopamine, dobutamine, or ephedrine  other medicines that prolong the QT interval (cause an abnormal heart rhythm)  paroxetine  quinidine  stimulant medicines for attention disorders, weight loss, or to stay awake  ziprasidone This list may not describe all possible interactions. Give your health care provider a list of all the medicines, herbs, non-prescription drugs, or dietary supplements you use. Also tell them if you smoke, drink alcohol, or use illegal drugs. Some items may interact with your medicine. What should I watch for while using this medicine? It may take a week or more for this medicine to take effect. This is why it is very important to continue taking the medicine and not miss any doses. If you have been taking this medicine regularly for some time, do not suddenly stop taking it. Ask your doctor or health care   professional for advice. Rarely, this medicine may increase thoughts of suicide or suicide attempts in children and teenagers. Call your child's health care professional right away if your child or teenager has new or increased thoughts of suicide or has changes in mood or behavior like becoming irritable or  anxious. Regularly monitor your child for these behavioral changes. For males, contact you doctor or health care professional right away if you have an erection that lasts longer than 4 hours or if it becomes painful. This may be a sign of serious problem and must be treated right away to prevent permanent damage. You may get drowsy or dizzy. Do not drive, use machinery, or do anything that needs mental alertness until you know how this medicine affects you. Do not stand or sit up quickly, especially if you are an older patient. This reduces the risk of dizzy or fainting spells. Alcohol can make you more drowsy and dizzy. Avoid alcoholic drinks. Do not treat yourself for coughs, colds or allergies without asking your doctor or health care professional for advice. Some ingredients can increase possible side effects. Your mouth may get dry. Chewing sugarless gum or sucking hard candy, and drinking plenty of water will help. What side effects may I notice from receiving this medicine? Side effects that you should report to your doctor or health care professional as soon as possible:  allergic reactions like skin rash, itching or hives, swelling of the face, lips, or tongue  breathing problems  chest pain  dark urine  fast, irregular heartbeat  general ill feeling or flu-like symptoms  high blood pressure  males: prolonged or painful erection  stomach pain or tenderness  trouble passing urine or change in the amount of urine  vomiting  weight loss  yellowing of the eyes or skin Side effects that usually do not require medical attention (report to your doctor or health care professional if they continue or are bothersome):  change in sex drive or performance  constipation or diarrhea  headache  loss of appetite  menstrual period irregularities  nausea  stomach upset This list may not describe all possible side effects. Call your doctor for medical advice about side effects.  You may report side effects to FDA at 1-800-FDA-1088. Where should I keep my medicine? Keep out of the reach of children. Store at room temperature between 15 and 30 degrees C (59 and 86 degrees F). Throw away any unused medication after the expiration date. NOTE: This sheet is a summary. It may not cover all possible information. If you have questions about this medicine, talk to your doctor, pharmacist, or health care provider.  2020 Elsevier/Gold Standard (2018-01-06 15:02:07)  

## 2019-07-21 NOTE — Telephone Encounter (Signed)
Called patient no answer. Per provider patient need a 4 week f/u virtual visit (40 minutes) from today's visit. (07/21/2019). If patient calls back [facility] for [facility] to schedule patient.

## 2019-07-22 NOTE — Telephone Encounter (Signed)
Call placed to pt.  Spoke with pt's fiance, Ryan. (on DPR).  Pt. Was unavailable at this time.  Requested to have pt. Call office to schedule recommended f/u appt. (per PCP, pt. needs a 4 wk. Virtual visit x 40 min.)

## 2019-08-04 ENCOUNTER — Telehealth: Payer: Self-pay | Admitting: Women's Health

## 2019-08-04 ENCOUNTER — Telehealth: Payer: Self-pay | Admitting: *Deleted

## 2019-08-04 NOTE — Telephone Encounter (Signed)
Patient called stating that she has been on the nexplanon of 4 months and she has not bleed on it, pt states that now she is bleeding and would like to know if this means she is pregnant or if she is ovulating, I tried to give patient an appointment next week, but pt is worried that if she could be pregnant this could hurt the pregnancy. Please contact pt

## 2019-08-04 NOTE — Telephone Encounter (Signed)
Called patient back and discussed her concerns. Advised that bleeding on nexplanon is very common. If it is persistent or heavy let us know. Patient had no other questions or concerns at this time.

## 2019-09-08 ENCOUNTER — Ambulatory Visit (INDEPENDENT_AMBULATORY_CARE_PROVIDER_SITE_OTHER): Payer: Medicaid Other | Admitting: Women's Health

## 2019-09-08 ENCOUNTER — Encounter: Payer: Self-pay | Admitting: Women's Health

## 2019-09-08 VITALS — BP 120/70 | HR 87 | Ht 62.0 in | Wt 180.0 lb

## 2019-09-08 DIAGNOSIS — F3181 Bipolar II disorder: Secondary | ICD-10-CM | POA: Diagnosis not present

## 2019-09-08 DIAGNOSIS — F39 Unspecified mood [affective] disorder: Secondary | ICD-10-CM

## 2019-09-08 DIAGNOSIS — F900 Attention-deficit hyperactivity disorder, predominantly inattentive type: Secondary | ICD-10-CM

## 2019-09-08 NOTE — Progress Notes (Signed)
° °  GYN VISIT Patient name: [name] [name] MRN [id number]  Date of birth: [birth year] Chief Complaint:   Postpartum symptoms  History of Present Illness:   [name] [name] is a 29 y.o. [phone] Caucasian female being seen today for report of postpartum depression.  Baby almost old, had SVB 04/01/19. Has h/o ADHD/Bipolar/Affective disorder, currently on Adderall/Lexapro/Lamictal she states from PCP and she states combination is not working. Was seen by [facility] in [place] in past, but moved. Does have some occ thoughts of harming self, no plan and states she would never act on thoughts d/t her children. Denies thoughts of harming children.  Depression screen [facility] 2/9 09/08/2019 05/12/2019 09/22/2018 06/17/2017 02/26/2017  Decreased Interest 3 2 3 2 3   Down, Depressed, Hopeless 3 2 3 1 2   PHQ - 2 Score 6 4 6 3 5   Altered sleeping 1 0 3 3 3   Tired, decreased energy 3 1 3 3 1   Change in appetite 3 1 3 3 3   Feeling bad or failure about yourself  1 0 3 0 3  Trouble concentrating 3 3 3 3 3   Moving slowly or fidgety/restless 2 0 0 2 0  Suicidal thoughts 1 0 1 0 0  PHQ-9 Score 20 9 22 17 18   Difficult doing work/chores Very difficult Very difficult - - Somewhat difficult    No LMP recorded. Patient has had an implant. The current method of family planning is Nexplanon.  Last pap 2018. Results were:  normal Review of Systems:   Pertinent items are noted in HPI Denies fever/chills, dizziness, headaches, visual disturbances, fatigue, shortness of breath, chest pain, abdominal pain, vomiting, abnormal vaginal discharge/itching/odor/irritation, problems with periods, bowel movements, urination, or intercourse unless otherwise stated above.  Pertinent History Reviewed:  Reviewed past medical,surgical, social, obstetrical and family history.  Reviewed problem list, medications and allergies. Physical Assessment:   Vitals:   09/08/19 1615  BP: 120/70  Pulse: 87  Weight: 180 lb (81.6 kg)  Height: 5\' 2"   (1.575 m)  Body mass index is 32.92 kg/m.       Physical Examination:   General appearance: alert, well appearing, rapid speech, quickly going from one subject to another without breaks  Mental status: alert, oriented to person, place, and time  Skin: warm & dry   Cardiovascular: normal heart rate noted  Respiratory: normal respiratory effort, no distress  Abdomen: not examined  Pelvic: examination not indicated  Extremities: no edema   Chaperone: n/a    No results found for this or any previous visit (from the past 24 hour(s)).  Assessment & Plan:  1) s/p SVB  2) H/O ADHD/Bipolar/Affective disorder> currently on Adderall/Lamictal/Lexapro, with flight of ideas, occ thoughts of self-harm w/o plan. Discussed she needs to be seen by mental health professional to adjust change meds/therapy. Urgent referral to [facility], note routed to [facility] as well.   Meds: No orders of the defined types were placed in this encounter.   Orders Placed This Encounter  Procedures   Ambulatory referral to Behavioral Health   Return for 1st available, Pap & physical.  CNM, [facility] 09/08/2019 5:04 PM

## 2019-09-14 ENCOUNTER — Telehealth (HOSPITAL_COMMUNITY): Payer: Self-pay | Admitting: *Deleted

## 2019-09-14 NOTE — Telephone Encounter (Signed)
Referral received and called patient to sche and she stated she will call office back to sch

## 2019-09-19 ENCOUNTER — Telehealth: Payer: Self-pay

## 2019-09-19 NOTE — Telephone Encounter (Signed)
Copied from CRM [id number]. Topic: General - Other >> Sep 19, 2019  1:35 PM Dalphine Handing A wrote: Patient would like a callback from Joni Reining in regards to having medical records sent to new psychiatrist. Please advise

## 2019-09-20 NOTE — Telephone Encounter (Signed)
Spoke with pt advised that we need a signed ROI. Pt verbalized understanding. TNP

## 2019-09-22 ENCOUNTER — Ambulatory Visit (INDEPENDENT_AMBULATORY_CARE_PROVIDER_SITE_OTHER): Payer: Medicaid Other | Admitting: Clinical

## 2019-09-22 ENCOUNTER — Encounter: Payer: Self-pay | Admitting: Physician Assistant

## 2019-09-22 ENCOUNTER — Telehealth (INDEPENDENT_AMBULATORY_CARE_PROVIDER_SITE_OTHER): Payer: Medicaid Other | Admitting: Physician Assistant

## 2019-09-22 ENCOUNTER — Other Ambulatory Visit: Payer: Self-pay

## 2019-09-22 DIAGNOSIS — F900 Attention-deficit hyperactivity disorder, predominantly inattentive type: Secondary | ICD-10-CM

## 2019-09-22 DIAGNOSIS — F39 Unspecified mood [affective] disorder: Secondary | ICD-10-CM

## 2019-09-22 DIAGNOSIS — F3181 Bipolar II disorder: Secondary | ICD-10-CM | POA: Diagnosis not present

## 2019-09-22 MED ORDER — AMPHETAMINE-DEXTROAMPHETAMINE 10 MG PO TABS: 5.0000 mg | ORAL_TABLET | Freq: Every day | ORAL | 0 refills | Status: DC

## 2019-09-22 MED ORDER — FLUOXETINE HCL 40 MG PO CAPS: 40.0000 mg | ORAL_CAPSULE | Freq: Every day | ORAL | 0 refills | Status: DC
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
## 2019-09-22 NOTE — Progress Notes (Signed)
MyChart Video Visit    Virtual Visit via Video Note   This visit type was conducted due to national recommendations for restrictions regarding the COVID-19 Pandemic (e.g. social distancing) in an effort to limit this patient's exposure and mitigate transmission in our community. This patient is at least at moderate risk for complications without adequate follow up. This format is felt to be most appropriate for this patient at this time. Physical exam was limited by quality of the video and audio technology used for the visit.   I connected with patient on 09/22/19 at  5:00 PM EDT by a video enabled telemedicine application and verified that I am speaking with the correct person using two identifiers.  I discussed the limitations of evaluation and management by telemedicine and the availability of in person appointments. The patient expressed understanding and agreed to proceed.  Patient location: Home Provider location: [facility]   Patient: [name] [name]   DOB: 19-Feb-1990   28 y.o. Female  MRN: [id number] Visit Date: 09/22/2019  Today's healthcare provider: Margaretann Loveless, PA-C   Chief Complaint  Patient presents with  . ADHD   Subjective    HPI   ADHD Per patient the Strattera didn't work. She wants to go back on the Adderall and started counseling today.  She has been having more issues with depression and anxiety. Has thoughts of flight. Does enjoy being a mother to her children. Feels that is the one thing she is successful at. Feels she can never be actually happy. Does have thoughts of "not being here" but has no suicidal plan. Feels her mood disorder is just "who she is" and she is learning to just be herself, but others do not know how to handle this being her. She has started CBT, counseling. There has already been another referral placed by her GYN for a new psychiatrist as well.   -----------------------------------------------------------------------------------------  Patient Active Problem List   Diagnosis Date Noted  . Nexplanon insertion 05/10/2019  . History of gestational diabetes 12/31/2018  . GERD (gastroesophageal reflux disease) 12/02/2017  . De Quervain's tenosynovitis, right 02/28/2016  . Antalgic gait 11/02/2015  . Attention deficit hyperactivity disorder (ADHD), predominantly inattentive type 10/12/2014  . 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 Bipolar 2 disorder (HCC) 09/08/2014  . Affective disorder (HCC) 09/07/2014   Past Medical History:  Diagnosis Date  . ADD (attention deficit disorder)   . ADHD (attention deficit hyperactivity disorder)   . Anxiety   . Bipolar disorder (HCC)   . Depression    manid depressive w/ generalized anxiety  . Frequent UTI   . PMDD (premenstrual dysphoric disorder)       Medications: Outpatient Medications Prior to Visit  Medication Sig  . amphetamine-dextroamphetamine (ADDERALL) 10 MG tablet Take 5 mg by mouth daily with breakfast.  . escitalopram (LEXAPRO) 20 MG tablet Take 1 tablet (20 mg total) by mouth daily.  Marland Kitchen lamoTRIgine (LAMICTAL) 200 MG tablet Take 1 tablet (200 mg total) by mouth daily.   No facility-administered medications prior to visit.    Review of Systems  Constitutional: Negative.   Respiratory: Negative.   Cardiovascular: Negative.   Musculoskeletal: Negative.   Psychiatric/Behavioral: Positive for agitation, decreased concentration, dysphoric mood and sleep disturbance. Negative for self-injury and suicidal ideas. The patient is nervous/anxious and is hyperactive.     Last CBC Lab Results  Component Value Date   WBC 8.6 05/12/2019   HGB 12.3 05/12/2019   HCT 38.9 05/12/2019  MCV 77 (L) 05/12/2019   MCH 24.3 (L) 05/12/2019   RDW 15.6 (H) 05/12/2019   PLT 234 05/12/2019   Last metabolic panel Lab Results  Component Value Date   GLUCOSE 82 05/12/2019    NA 142 05/12/2019   K 3.9 05/12/2019   CL 105 05/12/2019   CO2 23 05/12/2019   BUN 7 05/12/2019   CREATININE 0.79 05/12/2019   GFRNONAA 101 05/12/2019   GFRAA 117 05/12/2019   CALCIUM 9.2 05/12/2019   PROT 6.7 05/12/2019   ALBUMIN 4.5 05/12/2019   LABGLOB 2.2 05/12/2019   AGRATIO 2.0 05/12/2019   BILITOT <0.2 05/12/2019   ALKPHOS 108 05/12/2019   AST 20 05/12/2019   ALT 21 05/12/2019   ANIONGAP 6 05/13/2014      Objective    There were no vitals taken for this visit. BP Readings from Last 3 Encounters:  09/08/19 120/70  05/12/19 128/82  04/02/19 (!) 91/54   Wt Readings from Last 3 Encounters:  09/08/19 180 lb (81.6 kg)  06/09/19 183 lb (83 kg)  05/12/19 185 lb 12.8 oz (84.3 kg)      Physical Exam Vitals reviewed.  Constitutional:      Appearance: Normal appearance. She is well-developed.  Pulmonary:     Effort: Pulmonary effort is normal. No respiratory distress.  Neurological:     Mental Status: She is alert.  Psychiatric:        Attention and Perception: Attention and perception normal.        Mood and Affect: Mood is anxious and depressed.        Speech: Speech is rapid and pressured.        Behavior: Behavior normal. Behavior is cooperative.        Thought Content: Thought content normal.        Cognition and Memory: Cognition normal.        Judgment: Judgment normal.        Assessment & Plan     1. Bipolar 2 disorder (HCC) Change Lexapro to fluoxetine as below. Continue lamictal 200mg  daily. Will f/u in 4-6 weeks.  - FLUoxetine (PROZAC) 40 MG capsule; Take 1 capsule (40 mg total) by mouth daily.  Dispense: 90 capsule; Refill: 0  2. Affective disorder (HCC) See above medical treatment plan. - FLUoxetine (PROZAC) 40 MG capsule; Take 1 capsule (40 mg total) by mouth daily.  Dispense: 90 capsule; Refill: 0  3. Attention deficit hyperactivity disorder (ADHD), predominantly inattentive type Stop strattera. Start adderall 5mg  daily as below. The 10mg   she had irritability side effect. Has been trying 5mg  and this works better.  - amphetamine-dextroamphetamine (ADDERALL) 10 MG tablet; Take 0.5 tablets (5 mg total) by mouth daily with breakfast.  Dispense: 30 tablet; Refill: 0   No follow-ups on file.     I discussed the assessment and treatment plan with the patient. The patient was provided an opportunity to ask questions and all were answered. The patient agreed with the plan and demonstrated an understanding of the instructions.   The patient was advised to call back or seek an in-person evaluation if the symptoms worsen or if the condition fails to improve as anticipated.  I provided 23 minutes of non-face-to-face time during this encounter.  , PA-C, have reviewed all documentation for this visit. The documentation on 09/25/19 for the exam, diagnosis, procedures, and orders are all accurate and complete.  [facility] [phone] (phone) [phone] (fax)  [facility]

## 2019-09-25 ENCOUNTER — Encounter: Payer: Self-pay | Admitting: Physician Assistant

## 2019-09-25 NOTE — Patient Instructions (Signed)
Fluoxetine capsules or tablets (Depression/Mood Disorders) What is this medicine? FLUOXETINE (floo OX e teen) belongs to a class of drugs known as selective serotonin reuptake inhibitors (SSRIs). It helps to treat mood problems such as depression, obsessive compulsive disorder, and panic attacks. It can also treat certain eating disorders. This medicine may be used for other purposes; ask your health care provider or pharmacist if you have questions. COMMON BRAND NAME(S): Prozac What should I tell my health care provider before I take this medicine? They need to know if you have any of these conditions:  bipolar disorder or a family history of bipolar disorder  bleeding disorders  glaucoma  heart disease  liver disease  low levels of sodium in the blood  seizures  suicidal thoughts, plans, or attempt; a previous suicide attempt by you or a family member  take MAOIs like Carbex, Eldepryl, Marplan, Nardil, and Parnate  take medicines that treat or prevent blood clots  thyroid disease  an unusual or allergic reaction to fluoxetine, other medicines, foods, dyes, or preservatives  pregnant or trying to get pregnant  breast-feeding How should I use this medicine? Take this medicine by mouth with a glass of water. Follow the directions on the prescription label. You can take this medicine with or without food. Take your medicine at regular intervals. Do not take it more often than directed. Do not stop taking this medicine suddenly except upon the advice of your doctor. Stopping this medicine too quickly may cause serious side effects or your condition may worsen. A special MedGuide will be given to you by the pharmacist with each prescription and refill. Be sure to read this information carefully each time. Talk to your pediatrician regarding the use of this medicine in children. While this drug may be prescribed for children as young as 7 years for selected conditions, precautions do  apply. Overdosage: If you think you have taken too much of this medicine contact a poison control center or emergency room at once. NOTE: This medicine is only for you. Do not share this medicine with others. What if I miss a dose? If you miss a dose, skip the missed dose and go back to your regular dosing schedule. Do not take double or extra doses. What may interact with this medicine? Do not take this medicine with any of the following medications:  other medicines containing fluoxetine, like Sarafem or Symbyax  cisapride  dronedarone  linezolid  MAOIs like Carbex, Eldepryl, Marplan, Nardil, and Parnate  methylene blue (injected into a vein)  pimozide  thioridazine This medicine may also interact with the following medications:  alcohol  amphetamines  aspirin and aspirin-like medicines  carbamazepine  certain medicines for depression, anxiety, or psychotic disturbances  certain medicines for migraine headaches like almotriptan, eletriptan, frovatriptan, naratriptan, rizatriptan, sumatriptan, zolmitriptan  digoxin  diuretics  fentanyl  flecainide  furazolidone  isoniazid  lithium  medicines for sleep  medicines that treat or prevent blood clots like warfarin, enoxaparin, and dalteparin  NSAIDs, medicines for pain and inflammation, like ibuprofen or naproxen  other medicines that prolong the QT interval (an abnormal heart rhythm)  phenytoin  procarbazine  propafenone  rasagiline  ritonavir  supplements like St. John's wort, kava kava, valerian  tramadol  tryptophan  vinblastine This list may not describe all possible interactions. Give your health care provider a list of all the medicines, herbs, non-prescription drugs, or dietary supplements you use. Also tell them if you smoke, drink alcohol, or use illegal drugs.   Some items may interact with your medicine. What should I watch for while using this medicine? Tell your doctor if your  symptoms do not get better or if they get worse. Visit your doctor or health care professional for regular checks on your progress. Because it may take several weeks to see the full effects of this medicine, it is important to continue your treatment as prescribed by your doctor. Patients and their families should watch out for new or worsening thoughts of suicide or depression. Also watch out for sudden changes in feelings such as feeling anxious, agitated, panicky, irritable, hostile, aggressive, impulsive, severely restless, overly excited and hyperactive, or not being able to sleep. If this happens, especially at the beginning of treatment or after a change in dose, call your health care professional. You may get drowsy or dizzy. Do not drive, use machinery, or do anything that needs mental alertness until you know how this medicine affects you. Do not stand or sit up quickly, especially if you are an older patient. This reduces the risk of dizzy or fainting spells. Alcohol may interfere with the effect of this medicine. Avoid alcoholic drinks. Your mouth may get dry. Chewing sugarless gum or sucking hard candy, and drinking plenty of water may help. Contact your doctor if the problem does not go away or is severe. This medicine may affect blood sugar levels. If you have diabetes, check with your doctor or health care professional before you change your diet or the dose of your diabetic medicine. What side effects may I notice from receiving this medicine? Side effects that you should report to your doctor or health care professional as soon as possible:  allergic reactions like skin rash, itching or hives, swelling of the face, lips, or tongue  anxious  black, tarry stools  breathing problems  changes in vision  confusion  elevated mood, decreased need for sleep, racing thoughts, impulsive behavior  eye pain  fast, irregular heartbeat  feeling faint or lightheaded, falls  feeling  agitated, angry, or irritable  hallucination, loss of contact with reality  loss of balance or coordination  loss of memory  painful or prolonged erections  restlessness, pacing, inability to keep still  seizures  stiff muscles  suicidal thoughts or other mood changes  trouble sleeping  unusual bleeding or bruising  unusually weak or tired  vomiting Side effects that usually do not require medical attention (report to your doctor or health care professional if they continue or are bothersome):  change in appetite or weight  change in sex drive or performance  diarrhea  dry mouth  headache  increased sweating  nausea  tremors This list may not describe all possible side effects. Call your doctor for medical advice about side effects. You may report side effects to FDA at 1-800-FDA-1088. Where should I keep my medicine? Keep out of the reach of children. Store at room temperature between 15 and 30 degrees C (59 and 86 degrees F). Throw away any unused medicine after the expiration date. NOTE: This sheet is a summary. It may not cover all possible information. If you have questions about this medicine, talk to your doctor, pharmacist, or health care provider.  2020 Elsevier/Gold Standard (2017-09-24 11:56:53)  

## 2019-09-30 ENCOUNTER — Telehealth (INDEPENDENT_AMBULATORY_CARE_PROVIDER_SITE_OTHER): Payer: Medicaid Other | Admitting: Physician Assistant

## 2019-09-30 DIAGNOSIS — F3181 Bipolar II disorder: Secondary | ICD-10-CM

## 2019-09-30 MED ORDER — VENLAFAXINE HCL ER 37.5 MG PO CP24: 37.5000 mg | ORAL_CAPSULE | Freq: Every day | ORAL | 0 refills | Status: DC

## 2019-09-30 NOTE — Patient Instructions (Signed)
Bipolar 2 Disorder Bipolar 2 disorder is a mental health disorder in which a person has episodes of emotional highs and episodes of emotional lows, or depression. In bipolar 2 disorder, the episodes of emotional highs are less extreme and do not last as long as in bipolar 1 disorder. These highs are called hypomania. People with bipolar 2 disorder have had at least one episode of hypomania (hypomanic episode) in their lives, which is usually followed by a depressive episode. Some people may have cycles of hypomanic and depressive episodes. Some people with bipolar 2 disorder may lead a very normal life between episodes. What are the causes? The cause of this condition is not known. What increases the risk? The following factors may make you more likely to develop this condition:  Having a family member with the disorder.  Having an imbalance of certain chemicals in the brain (neurotransmitters).  Experiencing stress, such as illness, divorce, financial problems, or a death.  Having certain conditions that affect the brain or spinal cord (neurologic conditions).  Having had a brain injury (trauma). What are the signs or symptoms? Symptoms of hypomania include:  Very high self-esteem or self-confidence.  Decreased need for sleep.  Unusual talkativeness. Speech may be very fast.  Racing thoughts, with quick shifts between topics that may or may not be related (flight of ideas).  Change in ability to concentrate. Some people may have better focus, and others may not be able to focus at all.  Increased agitation. This could be pacing, squirming, fidgeting, or finger and toe tapping.  Impulsive behavior and poor judgment. This may result in high-risk activities, such as: ? Being sexual with people you normally wouldn't be sexual with. ? Spending money you have borrowed on things you don't need. Symptoms of depression include:  Extreme degrees of sadness, uncontrollable crying,  hopelessness, worthlessness, or numbness.  Sleep problems, such as insomnia, waking early, or sleeping too much.  No longer enjoying things you used to enjoy.  Isolation. You may often spend time alone.  Lack of energy or moving more slowly than normal.  Trouble making decisions.  Changes in appetite, such as eating too much or not eating.  Thoughts of death, or wanting to harm yourself. Sometimes, you may have a mix of symptoms of hypomania and depression at the same time. Stress can often trigger these symptoms. How is this diagnosed? This condition may be diagnosed based on:  Emotional episodes.  Medical history.  Use of alcohol, drugs, and prescription medicines. Certain medical conditions and substances can cause symptoms that seem like bipolar disorder. This is called secondary bipolar disorder. Your health care provider may ask you to take a short test. This helps to understand your symptoms. You may also be asked to see a mental health specialist for further evaluation or to start treatment. How is this treated?     This condition is a long-term (chronic) illness. It is often managed with ongoing treatment rather than treatment only when symptoms occur. A combination of treatments is the main approach. Treatment may include:  Psychotherapy. Some forms of talk therapy, such as cognitive behavioral therapy (CBT) and family therapy, can help with learning to manage bipolar disorder.  Psychoeducation. This helps you and others understand how this disorder is managed. Include friends and family in educational sessions so they learn how best to support you.  Methods of managing your condition, such as journaling or relaxation exercises. Relaxation exercises include: ? Yoga. ? Meditation. ? Deep breathing.    Lifestyle changes, such as: ? Limiting alcohol and drug use. ? Exercising regularly. ? Structuring when you go to bed and when you get up. ? Eating a healthy  diet.  Medicine. Medicine can be prescribed by a health care provider who specializes in treating mental health disorders (psychiatrist). Medicines called mood stabilizers are usually prescribed. If symptoms occur during treatment with a mood stabilizer, other medicines may be added. Follow these instructions at home: Activity  Return to your normal activities as told by your health care provider.  Find activities that you enjoy, and make time to do them.  Exercise regularly as told by your health care provider. Lifestyle   Follow a set daily schedule.  Eat a healthy diet that includes fresh fruits and vegetables, whole grains, low-fat dairy, and lean meat.  Get at least 7-8 hours of sleep each night.  Avoid using products that contain nicotine or tobacco. If you want help quitting, ask your health care provider.  Do not use drugs. Alcohol use  Do not drink alcohol if: ? Your health care provider tells you not to drink. ? You are pregnant, may be pregnant, or are planning to become pregnant.  If you drink alcohol: ? Limit how much you use to:  0-1 drink a day for women.  0-2 drinks a day for men. ? Be aware of how much alcohol is in your drink. In the [place] one drink equals one 12 oz bottle of beer (355 mL), one 5 oz glass of wine (148 mL), or one 1 oz glass of hard liquor (44 mL). General instructions  Take over-the-counter and prescription medicines only as told by your health care provider. You may think about stopping your medicine, but it is very important to take your medicine as prescribed.  Consider joining a support group. Your health care provider may be able to recommend one.  Talk with your family and friends about your treatment goals and how they can help.  Keep all follow-up visits as told by your health care provider. This is important. Where to find more information  National Alliance on Mental Illness: www.nami.org  National Institute of Mental  Health: www.nimh.nih.gov Contact a health care provider if:  Your symptoms get worse, or your loved ones tell you that your symptoms are getting worse.  You have uncomfortable side effects from your medicine.  You have trouble sleeping.  You have trouble doing daily activities.  You feel unsafe in your surroundings.  You are self-medicating with alcohol or drugs. Get help right away if:  You have new symptoms.  You have thoughts about harming yourself or others.  You are considering suicide. If you ever feel like you may hurt yourself or others, or have thoughts about taking your own life, get help right away. You can go to your nearest emergency department or call:  Your local emergency services (911 in the [place]  A suicide crisis helpline, such as the National Suicide Prevention Lifeline at 1-800-273-8255. This is open 24 hours a day. Summary  Bipolar 2 disorder is a lifelong mental health disorder in which a person has episodes of hypomania and depression.  This disorder is mainly treated with a combination of talk therapy, education, strategies for managing the condition, and medicines.  Talk with your family and friends about your treatment goals and how they can help.  Get help right away if you are considering suicide. This information is not intended to replace advice given to you by your health   care provider. Make sure you discuss any questions you have with your health care provider. Document Revised: 07/20/2018 Document Reviewed: 07/20/2018 Elsevier Patient Education  2020 [employer] Inc.  

## 2019-09-30 NOTE — Progress Notes (Signed)
MyChart Video Visit    Virtual Visit via Video Note   This visit type was conducted due to national recommendations for restrictions regarding the COVID-19 Pandemic (e.g. social distancing) in an effort to limit this patient's exposure and mitigate transmission in our community. This patient is at least at moderate risk for complications without adequate follow up. This format is felt to be most appropriate for this patient at this time. Physical exam was limited by quality of the video and audio technology used for the visit.   Patient location: Home Provider location: Office    I discussed the limitations of evaluation and management by telemedicine and the availability of in person appointments. The patient expressed understanding and agreed to proceed.  Patient: [name] [name]   DOB: [birth year]   28 y.o. Female  MRN: [id number] Visit Date: 09/30/2019  Today's healthcare provider: Trey Sailors, PA-C   Chief Complaint  Patient presents with  . Depression   Subjective    HPI   Patient with history of bipolar II disorder presenting today for worsening depression. She was seen by her PCP for the same on 09/22/2019 and was switched from lexapro to Prozac 40 mg daily. She is currently on Lamictal 200 mg daily. She is additionally on adderall 10 mg BID. She reports she forgot her child outside and is very disturbed about this. She reports the new medication is not working for her depression and is making her ADHD worse. She reports she is having worsening memory. She was on abilify previously but is no longer on that. She is not currently followed by psychiatrist. She has recently delivered and reports she has struggled some with PPD. Denies suicidal ideation. She has seen a psychiatrist previously. She does currently see a therapist.      Medications: Outpatient Medications Prior to Visit  Medication Sig  . amphetamine-dextroamphetamine (ADDERALL) 10 MG tablet Take 0.5 tablets (5  mg total) by mouth daily with breakfast.  . lamoTRIgine (LAMICTAL) 200 MG tablet Take 1 tablet (200 mg total) by mouth daily.  . [DISCONTINUED] FLUoxetine (PROZAC) 40 MG capsule Take 1 capsule (40 mg total) by mouth daily.   No facility-administered medications prior to visit.    Review of Systems  All other systems reviewed and are negative.      Objective    There were no vitals taken for this visit.    Physical Exam Constitutional:      Appearance: Normal appearance.  Pulmonary:     Effort: Pulmonary effort is normal. No respiratory distress.  Neurological:     Mental Status: She is alert.  Psychiatric:        Mood and Affect: Mood is depressed.        Speech: Speech is rapid and pressured and tangential.        Assessment & Plan    1. Bipolar 2 disorder (HCC)  We will switch Prozac to Effexor as below. She will continue Lamictal. Follow up with PCP in 4 weeks.   - venlafaxine XR (EFFEXOR XR) 37.5 MG 24 hr capsule; Take 1 capsule (37.5 mg total) by mouth daily with breakfast.  Dispense: 90 capsule; Refill: 0    Return if symptoms worsen or fail to improve.     I discussed the assessment and treatment plan with the patient. The patient was provided an opportunity to ask questions and all were answered. The patient agreed with the plan and demonstrated an understanding of the instructions.   The  patient was advised to call back or seek an in-person evaluation if the symptoms worsen or if the condition fails to improve as anticipated.     Maryella Shivers [facility] [phone] (phone) [phone] (fax)  [facility]

## 2019-11-22 ENCOUNTER — Other Ambulatory Visit: Payer: Medicaid Other | Admitting: Women's Health

## 2020-01-02 ENCOUNTER — Other Ambulatory Visit: Payer: Self-pay | Admitting: Physician Assistant

## 2020-01-02 DIAGNOSIS — F3181 Bipolar II disorder: Secondary | ICD-10-CM

## 2020-01-02 DIAGNOSIS — F900 Attention-deficit hyperactivity disorder, predominantly inattentive type: Secondary | ICD-10-CM

## 2020-01-02 MED ORDER — VENLAFAXINE HCL ER 37.5 MG PO CP24: 37.5000 mg | ORAL_CAPSULE | Freq: Every day | ORAL | 3 refills | Status: DC

## 2020-01-02 MED ORDER — AMPHETAMINE-DEXTROAMPHETAMINE 10 MG PO TABS: 5.0000 mg | ORAL_TABLET | Freq: Every day | ORAL | 0 refills | Status: DC

## 2020-01-02 NOTE — Telephone Encounter (Signed)
Requested medication (s) are due for refill today: yes  Requested medication (s) are on the active medication list: yes  Last refill: Adderall 09/22/19 #30  0 refills, effexor 09/30/19  #90  Nani Gasser  Future visit scheduled: No  Notes to clinic:  OV note states follow up in 4 weeks. Attempted to contact patient to schedule. Message that call cold not be completed at this time and to try later. Adderall is not delegated. Effexor required 4 week follow up.    Requested Prescriptions  Pending Prescriptions Disp Refills   amphetamine-dextroamphetamine (ADDERALL) 10 MG tablet 30 tablet 0    Sig: Take 0.5 tablets (5 mg total) by mouth daily with breakfast.      Not Delegated - Psychiatry:  Stimulants/ADHD Failed - 01/02/2020 12:14 PM      Failed - This refill cannot be delegated      Failed - Urine Drug Screen completed in last 360 days      Failed - Valid encounter within last 3 months    Recent Outpatient Visits           3 months ago Bipolar 2 disorder [facility])   [facility] [place], Adriana M, PA-C   3 months ago Bipolar 2 disorder [facility])   [facility] [place], [facility], PA-C   5 months ago Affective disorder [facility])   [facility] [place], [place], PA-C   6 months ago Gastroesophageal reflux disease without esophagitis   [facility], [place], PA-C   6 months ago Sore throat   [facility] [place], Marzella Schlein, MD                venlafaxine XR (EFFEXOR XR) 37.5 MG 24 hr capsule 90 capsule 0    Sig: Take 1 capsule (37.5 mg total) by mouth daily with breakfast.      Psychiatry: Antidepressants - SNRI - desvenlafaxine & venlafaxine Failed - 01/02/2020 12:14 PM      Failed - LDL in normal range and within 360 days    LDL Chol Calc (NIH)  Date Value Ref Range Status  05/12/2019 107 (H) 0 - 99 mg/dL Final          Passed - Total Cholesterol in normal range and within 360 days     Cholesterol, Total  Date Value Ref Range Status  05/12/2019 175 100 - 199 mg/dL Final          Passed - Triglycerides in normal range and within 360 days    Triglycerides  Date Value Ref Range Status  05/12/2019 125 0 - 149 mg/dL Final          Passed - Last BP in normal range    BP Readings from Last 1 Encounters:  09/08/19 120/70          Passed - Valid encounter within last 6 months    Recent Outpatient Visits           3 months ago Bipolar 2 disorder [facility])   [facility], [place], PA-C   3 months ago Bipolar 2 disorder [facility])   [facility] [place], [place], PA-C   5 months ago Affective disorder [facility])   [facility] [place], [place], [place]   6 months ago Gastroesophageal reflux disease without esophagitis   [facility], [place], [place]   6 months ago Sore throat   [facility] [place], Marzella Schlein, MD

## 2020-01-02 NOTE — Telephone Encounter (Signed)
Medication Refill - Medication: Adderall,  Venlafaxine   Has the patient contacted their pharmacy? Yes.   (Agent: If no, request that the patient contact the pharmacy for the refill.) (Agent: If yes, when and what did the pharmacy advise?)  Preferred Pharmacy (with phone number or street name):  [id number] - [place], [place] - [address]  [address] [place] [address]  Phone: [phone] Fax: [phone]  Hours: Not open 24 hours     Agent: Please be advised that RX refills may take up to 3 business days. We ask that you follow-up with your pharmacy.

## 2020-03-09 ENCOUNTER — Other Ambulatory Visit: Payer: Self-pay | Admitting: Physician Assistant

## 2020-03-09 DIAGNOSIS — F3181 Bipolar II disorder: Secondary | ICD-10-CM

## 2020-03-09 DIAGNOSIS — F39 Unspecified mood [affective] disorder: Secondary | ICD-10-CM

## 2020-03-09 MED ORDER — LAMOTRIGINE 200 MG PO TABS: 200.0000 mg | ORAL_TABLET | Freq: Every day | ORAL | 1 refills | Status: DC

## 2020-03-09 NOTE — Telephone Encounter (Signed)
Medication: lamoTRIgine (LAMICTAL) 200 MG tablet [[id number]] ,   Has the patient contacted their pharmacy? YES (Agent: If no, request that the patient contact the pharmacy for the refill.) (Agent: If yes, when and what did the pharmacy advise?)  Preferred Pharmacy (with phone number or street name): [facility] [address] [place], [place]. [address] [phone]  Agent: Please be advised that RX refills may take up to 3 business days. We ask that you follow-up with your pharmacy.

## 2020-03-12 ENCOUNTER — Encounter: Payer: Self-pay | Admitting: Physician Assistant

## 2020-03-12 ENCOUNTER — Telehealth (INDEPENDENT_AMBULATORY_CARE_PROVIDER_SITE_OTHER): Payer: Medicaid Other | Admitting: Physician Assistant

## 2020-03-12 DIAGNOSIS — F3181 Bipolar II disorder: Secondary | ICD-10-CM | POA: Diagnosis not present

## 2020-03-12 DIAGNOSIS — F900 Attention-deficit hyperactivity disorder, predominantly inattentive type: Secondary | ICD-10-CM

## 2020-03-12 DIAGNOSIS — F39 Unspecified mood [affective] disorder: Secondary | ICD-10-CM | POA: Diagnosis not present

## 2020-03-12 MED ORDER — AMPHETAMINE-DEXTROAMPHETAMINE 10 MG PO TABS: 5.0000 mg | ORAL_TABLET | Freq: Every day | ORAL | 0 refills | Status: DC

## 2020-03-12 MED ORDER — LAMOTRIGINE 200 MG PO TABS: 200.0000 mg | ORAL_TABLET | Freq: Every day | ORAL | 1 refills | Status: AC

## 2020-03-12 MED ORDER — ESCITALOPRAM OXALATE 10 MG PO TABS: 10.0000 mg | ORAL_TABLET | Freq: Every day | ORAL | 1 refills | Status: DC

## 2020-03-12 NOTE — Patient Instructions (Signed)
Bipolar 2 Disorder Bipolar 2 disorder is a mental health disorder in which a person has episodes of emotional highs and episodes of emotional lows, or depression. In bipolar 2 disorder, the episodes of emotional highs are less extreme and do not last as long as in bipolar 1 disorder. These highs are called hypomania. People with bipolar 2 disorder have had at least one episode of hypomania (hypomanic episode) in their lives, which is usually followed by a depressive episode. Some people may have cycles of hypomanic and depressive episodes. Some people with bipolar 2 disorder may lead a very normal life between episodes. What are the causes? The cause of this condition is not known. What increases the risk? The following factors may make you more likely to develop this condition:  Having a family member with the disorder.  Having an imbalance of certain chemicals in the brain (neurotransmitters).  Experiencing stress, such as illness, divorce, financial problems, or a death.  Having certain conditions that affect the brain or spinal cord (neurologic conditions).  Having had a brain injury (trauma). What are the signs or symptoms? Symptoms of hypomania include:  Very high self-esteem or self-confidence.  Decreased need for sleep.  Unusual talkativeness. Speech may be very fast.  Racing thoughts, with quick shifts between topics that may or may not be related (flight of ideas).  Change in ability to concentrate. Some people may have better focus, and others may not be able to focus at all.  Increased agitation. This could be pacing, squirming, fidgeting, or finger and toe tapping.  Impulsive behavior and poor judgment. This may result in high-risk activities, such as: ? Being sexual with people you normally wouldn't be sexual with. ? Spending money you have borrowed on things you don't need. Symptoms of depression include:  Extreme degrees of sadness, uncontrollable crying,  hopelessness, worthlessness, or numbness.  Sleep problems, such as insomnia, waking early, or sleeping too much.  No longer enjoying things you used to enjoy.  Isolation. You may often spend time alone.  Lack of energy or moving more slowly than normal.  Trouble making decisions.  Changes in appetite, such as eating too much or not eating.  Thoughts of death, or wanting to harm yourself. Sometimes, you may have a mix of symptoms of hypomania and depression at the same time. Stress can often trigger these symptoms. How is this diagnosed? This condition may be diagnosed based on:  Emotional episodes.  Medical history.  Use of alcohol, drugs, and prescription medicines. Certain medical conditions and substances can cause symptoms that seem like bipolar disorder. This is called secondary bipolar disorder. Your health care provider may ask you to take a short test. This helps to understand your symptoms. You may also be asked to see a mental health specialist for further evaluation or to start treatment. How is this treated? This condition is a long-term (chronic) illness. It is often managed with ongoing treatment rather than treatment only when symptoms occur. A combination of treatments is the main approach. Treatment may include:  Psychotherapy. Some forms of talk therapy, such as cognitive behavioral therapy (CBT) and family therapy, can help with learning to manage bipolar disorder.  Psychoeducation. This helps you and others understand how this disorder is managed. Include friends and family in educational sessions so they learn how best to support you.  Methods of managing your condition, such as journaling or relaxation exercises. Relaxation exercises include: ? Yoga. ? Meditation. ? Deep breathing.  Lifestyle changes, such as: ?  Limiting alcohol and drug use. ? Exercising regularly. ? Structuring when you go to bed and when you get up. ? Eating a healthy  diet.  Medicine. Medicine can be prescribed by a health care provider who specializes in treating mental health disorders (psychiatrist). Medicines called mood stabilizers are usually prescribed. If symptoms occur during treatment with a mood stabilizer, other medicines may be added.      Follow these instructions at home: Activity  Return to your normal activities as told by your health care provider.  Find activities that you enjoy, and make time to do them.  Exercise regularly as told by your health care provider. Lifestyle  Follow a set daily schedule.  Eat a healthy diet that includes fresh fruits and vegetables, whole grains, low-fat dairy, and lean meat.  Get at least 7-8 hours of sleep each night.  Avoid using products that contain nicotine or tobacco. If you want help quitting, ask your health care provider.  Do not use drugs.   Alcohol use  Do not drink alcohol if: ? Your health care provider tells you not to drink. ? You are pregnant, may be pregnant, or are planning to become pregnant.  If you drink alcohol: ? Limit how much you use to:  0-1 drink a day for women.  0-2 drinks a day for men. ? Be aware of how much alcohol is in your drink. In the [place] one drink equals one 12 oz bottle of beer (355 mL), one 5 oz glass of wine (148 mL), or one 1 oz glass of hard liquor (44 mL). General instructions  Take over-the-counter and prescription medicines only as told by your health care provider. You may think about stopping your medicine, but it is very important to take your medicine as prescribed.  Consider joining a support group. Your health care provider may be able to recommend one.  Talk with your family and friends about your treatment goals and how they can help.  Keep all follow-up visits as told by your health care provider. This is important. Where to find more information  National Alliance on Mental Illness: www.nami.org  National Institute of  Mental Health: www.nimh.nih.gov Contact a health care provider if:  Your symptoms get worse, or your loved ones tell you that your symptoms are getting worse.  You have uncomfortable side effects from your medicine.  You have trouble sleeping.  You have trouble doing daily activities.  You feel unsafe in your surroundings.  You are self-medicating with alcohol or drugs. Get help right away if:  You have new symptoms.  You have thoughts about harming yourself or others.  You are considering suicide. If you ever feel like you may hurt yourself or others, or have thoughts about taking your own life, get help right away. You can go to your nearest emergency department or call:  Your local emergency services (911 in the [place]  A suicide crisis helpline, such as the National Suicide Prevention Lifeline at 1-800-273-8255. This is open 24 hours a day. Summary  Bipolar 2 disorder is a lifelong mental health disorder in which a person has episodes of hypomania and depression.  This disorder is mainly treated with a combination of talk therapy, education, strategies for managing the condition, and medicines.  Talk with your family and friends about your treatment goals and how they can help.  Get help right away if you are considering suicide. This information is not intended to replace advice given to you by your   health care provider. Make sure you discuss any questions you have with your health care provider. Document Revised: 07/20/2018 Document Reviewed: 07/20/2018 Elsevier Patient Education  2021 [employer].

## 2020-03-12 NOTE — Progress Notes (Unsigned)
MyChart Video Visit    Virtual Visit via Video Note   This visit type was conducted due to national recommendations for restrictions regarding the COVID-19 Pandemic (e.g. social distancing) in an effort to limit this patient's exposure and mitigate transmission in our community. This patient is at least at moderate risk for complications without adequate follow up. This format is felt to be most appropriate for this patient at this time. Physical exam was limited by quality of the video and audio technology used for the visit.   Interactive audio and video communications were attempted, although failed due to patient's inability to connect to video. Continued visit with audio only interaction with patient agreement.   Patient location: Home Provider location: [facility]  I discussed the limitations of evaluation and management by telemedicine and the availability of in person appointments. The patient expressed understanding and agreed to proceed.  Patient: [name] [name]   DOB: 06-Jul-1990   29 y.o. Female  MRN: [id number] Visit Date: 03/12/2020  Today's healthcare provider: Margaretann Loveless, PA-C   Chief Complaint  Patient presents with  . Follow-up    Bipolar 2 pt would like to discuss going back on her old medication.     Subjective    HPI HPI    Follow-up     Additional comments: Bipolar 2 pt would like to discuss going back on her old medication.         Last edited by Paschal Dopp, CMA on 03/12/2020 10:23 AM. (History)      Follow up for Bipolar 2:  The patient was last seen for this 6 months ago. Changes made at last visit include had stopped lexapro and started venlafaxine.  She reports fair compliance with treatment. She feels that condition is Worse.  Pt would like to go back on Escitalopram 10mg  daily. She is not having side effects.   She does report that she has been without any lexapro, venlafaxine or lamotrigine over the last 3  days or so.  -----------------------------------------------------------------------------------------    Patient Active Problem List   Diagnosis Date Noted  . Nexplanon insertion 05/10/2019  . History of gestational diabetes 12/31/2018  . GERD (gastroesophageal reflux disease) 12/02/2017  . De Quervain's tenosynovitis, right 02/28/2016  . Antalgic gait 11/02/2015  . Attention deficit hyperactivity disorder (ADHD), predominantly inattentive type 10/12/2014  . 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 Bipolar 2 disorder (HCC) 09/08/2014  . Affective disorder (HCC) 09/07/2014   Past Medical History:  Diagnosis Date  . ADD (attention deficit disorder)   . ADHD (attention deficit hyperactivity disorder)   . Anxiety   . Bipolar disorder (HCC)   . Depression    manid depressive w/ generalized anxiety  . Frequent UTI   . PMDD (premenstrual dysphoric disorder)    Social History   Tobacco Use  . Smoking status: Current Every Day Smoker    Packs/day: 0.50    Types: Cigarettes    Start date: 09/06/2009  . Smokeless tobacco: Never Used  . Tobacco comment: 2-3 cigs per day  Vaping Use  . Vaping Use: Never used  Substance Use Topics  . Alcohol use: Not Currently    Alcohol/week: 4.0 standard drinks    Types: 4 Glasses of wine per week  . Drug use: No   No Known Allergies  Medications: Outpatient Medications Prior to Visit  Medication Sig  . amphetamine-dextroamphetamine (ADDERALL) 10 MG tablet Take 0.5 tablets (5 mg total) by mouth daily with  breakfast.  . lamoTRIgine (LAMICTAL) 200 MG tablet Take 1 tablet (200 mg total) by mouth daily.  Marland Kitchen venlafaxine XR (EFFEXOR XR) 37.5 MG 24 hr capsule Take 1 capsule (37.5 mg total) by mouth daily with breakfast.   No facility-administered medications prior to visit.    Review of Systems  Constitutional: Negative.   Respiratory: Negative.   Cardiovascular: Negative.   Gastrointestinal: Negative.   Neurological: Negative  for dizziness, light-headedness and headaches.  Psychiatric/Behavioral: Negative.       Objective    There were no vitals taken for this visit.   Physical Exam Vitals reviewed.  Constitutional:      Appearance: She is well-developed and well-nourished.  Eyes:     Extraocular Movements: EOM normal.  Pulmonary:     Effort: No respiratory distress.  Neurological:     Mental Status: She is alert.  Psychiatric:        Attention and Perception: Attention and perception normal.        Mood and Affect: Mood is depressed.        Speech: Speech is rapid and pressured.        Behavior: Behavior is hyperactive. Behavior is cooperative.        Thought Content: Thought content normal. Thought content does not include homicidal or suicidal ideation. Thought content does not include homicidal or suicidal plan.        Cognition and Memory: Cognition normal.        Judgment: Judgment normal.       Assessment & Plan     1. Affective disorder (HCC) Restart lexapro 10mg  and lamictal 200mg  daily. Stop Venlafaxine. F/U in 4 weeks.  - escitalopram (LEXAPRO) 10 MG tablet; Take 1 tablet (10 mg total) by mouth daily.  Dispense: 90 tablet; Refill: 1 - lamoTRIgine (LAMICTAL) 200 MG tablet; Take 1 tablet (200 mg total) by mouth daily.  Dispense: 90 tablet; Refill: 1  2. Bipolar 2 disorder (HCC) See above medical treatment plan. - escitalopram (LEXAPRO) 10 MG tablet; Take 1 tablet (10 mg total) by mouth daily.  Dispense: 90 tablet; Refill: 1 - lamoTRIgine (LAMICTAL) 200 MG tablet; Take 1 tablet (200 mg total) by mouth daily.  Dispense: 90 tablet; Refill: 1  3. Attention deficit hyperactivity disorder (ADHD), predominantly inattentive type Stable. Diagnosis pulled for medication refill. Continue current medical treatment plan. - amphetamine-dextroamphetamine (ADDERALL) 10 MG tablet; Take 0.5 tablets (5 mg total) by mouth daily with breakfast.  Dispense: 30 tablet; Refill: 0   No follow-ups on file.      I discussed the assessment and treatment plan with the patient. The patient was provided an opportunity to ask questions and all were answered. The patient agreed with the plan and demonstrated an understanding of the instructions.   The patient was advised to call back or seek an in-person evaluation if the symptoms worsen or if the condition fails to improve as anticipated.  I provided 23 minutes of non face-to-face time during this encounter via telephone.  , PA-C, have reviewed all documentation for this visit. The documentation on 03/15/20 for the exam, diagnosis, procedures, and orders are all accurate and complete.  Delmer Islam [facility] [phone] (phone) [phone] (fax)  [facility]

## 2020-04-17 ENCOUNTER — Telehealth: Payer: Self-pay | Admitting: Physician Assistant

## 2020-04-17 ENCOUNTER — Ambulatory Visit: Payer: Self-pay | Admitting: *Deleted

## 2020-04-17 NOTE — Telephone Encounter (Signed)
Can we schedule her 11am tomorrow virtual video or telephone visit, 40 min please

## 2020-04-17 NOTE — Telephone Encounter (Signed)
error 

## 2020-04-17 NOTE — Telephone Encounter (Addendum)
Pt calling in just started saying "It causes heart  Palpitations".    "I'm having a mental health something".  "I'm not doing well at all".   "I have Adderall, Lexapro and I'm throwing up".   Mentally I'm well but physically sick" I don't leave my house.   I work at home.  Pt was talking fast and continuously jumping from one subject to another.   The agent that transferred her to me for triage had been on the phone with her for 20 minutes listening to her because she was talking non stop.  The bottom line is she is vomiting after taking her medications for mental health in the mornings.   "I'm just sick and throwing up".  "I think my medications need to be changed".    I've been sicker since this medicine.   I'm not depressed anymore and I'm doing better on my job".  "I even had a good evaluation"   I can't eat".   I need to figure out what medicine is making me sick.   I started new medicine a couple of months ago or middle of last year then I stopped it.  I was depressed then.  I'm better now but just can't stop vomiting.  "I just wonder if something is interacting?"  I sent my notes to [facility] high priority to [employer].  Pt admitted she was not going to hurt herself and do anything crazy without me asking her.  She is agreeable to being called back for an appt.    The next available time Victorino Dike had was 05/02/2020. She can be reached at [phone].  She has done video visits with Victorino Dike.   She is living in [place].    I called into the office after they opened from lunch to be sure my notes went through.   They did and will get them to [facility].  Reason for Disposition . [1] Caller has URGENT medicine question about med that PCP or specialist prescribed AND [2] triager unable to answer question  Answer Assessment - Initial Assessment Questions 1. NAME of MEDICATION: "What medicine are you calling about?"     My mental health medications are  making me sick. Escitalopram, Lexapro lamictal,Adderall 2. QUESTION: "What is your question?" (e.g., medication refill, side effect)     Stopped Venlafaxine 3. PRESCRIBING HCP: "Who prescribed it?" Reason: if prescribed by specialist, call should be referred to that group.     Joycelyn Man 4. SYMPTOMS: "Do you have any symptoms?"     Pt is vomiting 5. SEVERITY: If symptoms are present, ask "Are they mild, moderate or severe?"     moderate 6. PREGNANCY:  "Is there any chance that you are pregnant?" "When was your last menstrual period?"     No  Protocols used: MEDICATION QUESTION CALL-A-AH

## 2020-04-18 NOTE — Telephone Encounter (Signed)
Tried calling pt's voicemail box is full.  PEC please offer pt an appointment (Can be virtual) with any provider that is available.   Thanks,   -Vernona Rieger

## 2020-07-12 ENCOUNTER — Encounter: Payer: Self-pay | Admitting: Family Medicine

## 2020-07-12 ENCOUNTER — Telehealth (INDEPENDENT_AMBULATORY_CARE_PROVIDER_SITE_OTHER): Payer: Medicaid Other | Admitting: Family Medicine

## 2020-07-12 DIAGNOSIS — K219 Gastro-esophageal reflux disease without esophagitis: Secondary | ICD-10-CM

## 2020-07-12 DIAGNOSIS — F39 Unspecified mood [affective] disorder: Secondary | ICD-10-CM

## 2020-07-12 DIAGNOSIS — F900 Attention-deficit hyperactivity disorder, predominantly inattentive type: Secondary | ICD-10-CM

## 2020-07-12 DIAGNOSIS — F3181 Bipolar II disorder: Secondary | ICD-10-CM | POA: Diagnosis not present

## 2020-07-12 MED ORDER — AMPHETAMINE-DEXTROAMPHETAMINE 10 MG PO TABS: 5.0000 mg | ORAL_TABLET | Freq: Every day | ORAL | 0 refills | Status: DC

## 2020-07-12 MED ORDER — FAMOTIDINE 40 MG PO TABS: 40.0000 mg | ORAL_TABLET | Freq: Every day | ORAL | 1 refills | Status: DC

## 2020-07-12 MED ORDER — ESCITALOPRAM OXALATE 20 MG PO TABS: 20.0000 mg | ORAL_TABLET | Freq: Every day | ORAL | 1 refills | Status: DC

## 2020-07-12 NOTE — Progress Notes (Signed)
MyChart Video Visit    Virtual Visit via Video Note   This visit type was conducted due to national recommendations for restrictions regarding the COVID-19 Pandemic (e.g. social distancing) in an effort to limit this patient's exposure and mitigate transmission in our community. This patient is at least at moderate risk for complications without adequate follow up. This format is felt to be most appropriate for this patient at this time. Physical exam was limited by quality of the video and audio technology used for the visit.   Patient location: Home Provider location: Office  I discussed the limitations of evaluation and management by telemedicine and the availability of in person appointments. The patient expressed understanding and agreed to proceed.  Patient: [name] [name]   DOB: 06-May-1990   29 y.o. Female  MRN: [id number] Visit Date: 07/12/2020  Today's healthcare provider: Dortha Kern, PA-C   Chief Complaint  Patient presents with  . Follow-up   Subjective    HPI  Pt states she had to stop lamotrigine since it was causing nausea.  She would like to discuss trying an alternative.     Patient Active Problem List   Diagnosis Date Noted  . Nexplanon insertion 05/10/2019  . History of gestational diabetes 12/31/2018  . GERD (gastroesophageal reflux disease) 12/02/2017  . De Quervain's tenosynovitis, right 02/28/2016  . Antalgic gait 11/02/2015  . Attention deficit hyperactivity disorder (ADHD), predominantly inattentive type 10/12/2014  . 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 Bipolar 2 disorder (HCC) 09/08/2014  . Affective disorder (HCC) 09/07/2014   Past Medical History:  Diagnosis Date  . ADD (attention deficit disorder)   . ADHD (attention deficit hyperactivity disorder)   . Anxiety   . Bipolar disorder (HCC)   . Depression    manid depressive w/ generalized anxiety  . Frequent UTI   . PMDD (premenstrual dysphoric disorder)    Social  History   Tobacco Use  . Smoking status: Current Every Day Smoker    Packs/day: 0.50    Types: Cigarettes    Start date: 09/06/2009  . Smokeless tobacco: Never Used  . Tobacco comment: 2-3 cigs per day  Vaping Use  . Vaping Use: Never used  Substance Use Topics  . Alcohol use: Not Currently    Alcohol/week: 4.0 standard drinks    Types: 4 Glasses of wine per week  . Drug use: No   No Known Allergies  Medications: Outpatient Medications Prior to Visit  Medication Sig  . amphetamine-dextroamphetamine (ADDERALL) 10 MG tablet Take 0.5 tablets (5 mg total) by mouth daily with breakfast.  . escitalopram (LEXAPRO) 10 MG tablet Take 1 tablet (10 mg total) by mouth daily.  Marland Kitchen lamoTRIgine (LAMICTAL) 200 MG tablet Take 1 tablet (200 mg total) by mouth daily. (Patient not taking: Reported on 07/12/2020)   No facility-administered medications prior to visit.    Review of Systems  Constitutional: Negative.   HENT: Negative.   Respiratory: Negative.   Cardiovascular: Negative.   Gastrointestinal: Positive for nausea.       Heartburn with reflux.  Psychiatric/Behavioral: Positive for behavioral problems.       Bipolar swings with sadness and some irritability with decreased concentration.      Objective    There were no vitals taken for this visit.   Physical Exam: WDWN female in no apparent distress.  Head: Normocephalic, atraumatic. Neck: Supple, NROM Respiratory: No apparent distress Psych: Pushed speech    Assessment & Plan     1.  Gastroesophageal reflux disease without esophagitis Recent nausea and increase in reflux. Will add H2 Blocker and monitor for melena or hematemesis. Restrict acidic foods and caffeine. If no better, may need evaluation by GI. - famotidine (PEPCID) 40 MG tablet; Take 1 tablet (40 mg total) by mouth daily.  Dispense: 90 tablet; Refill: 1  2. Attention deficit hyperactivity disorder (ADHD), predominantly inattentive type Only uses Adderall on days  she goes to work.  - amphetamine-dextroamphetamine (ADDERALL) 10 MG tablet; Take 0.5 tablets (5 mg total) by mouth daily with breakfast.  Dispense: 30 tablet; Refill: 0  3. Bipolar 2 disorder (HCC) Feeling more sad since birth of 3rd child. Increase Lexapro to 20 mg qd. If no better with adjustments, will need follow up with psychiatrist. - escitalopram (LEXAPRO) 20 MG tablet; Take 1 tablet (20 mg total) by mouth daily.  Dispense: 90 tablet; Refill: 1  4. Affective disorder (HCC) Presently on Lamictal 200 mg qd but feels this causes more nausea.  Will decrease to 100 mg qd and add H2 Blocker for nausea with reflux.   No follow-ups on file.     I discussed the assessment and treatment plan with the patient. The patient was provided an opportunity to ask questions and all were answered. The patient agreed with the plan and demonstrated an understanding of the instructions.   The patient was advised to call back or seek an in-person evaluation if the symptoms worsen or if the condition fails to improve as anticipated.  I provided 30 minutes of non-face-to-face time during this encounter.  I, Jlyn Bracamonte, PA-C, have reviewed all documentation for this visit. The documentation on 07/12/20 for the exam, diagnosis, procedures, and orders are all accurate and complete.   Dortha Kern, PA-C [employer] [phone] (phone) [phone] (fax)  [facility]

## 2020-09-19 ENCOUNTER — Other Ambulatory Visit: Payer: Self-pay | Admitting: Family Medicine

## 2020-09-19 DIAGNOSIS — F900 Attention-deficit hyperactivity disorder, predominantly inattentive type: Secondary | ICD-10-CM

## 2020-09-19 NOTE — Telephone Encounter (Signed)
Requested medication (s) are due for refill today: Yes  Requested medication (s) are on the active medication list: Yes  Last refill:  07/12/20  Future visit scheduled: No  Notes to clinic:  Unable to refill per protocol, cannot delegate.      Requested Prescriptions  Pending Prescriptions Disp Refills   amphetamine-dextroamphetamine (ADDERALL) 10 MG tablet 30 tablet 0    Sig: Take 0.5 tablets (5 mg total) by mouth daily with breakfast.      Not Delegated - Psychiatry:  Stimulants/ADHD Failed - 09/19/2020  1:28 PM      Failed - This refill cannot be delegated      Failed - Urine Drug Screen completed in last 360 days      Passed - Valid encounter within last 3 months    Recent Outpatient Visits           2 months ago Gastroesophageal reflux disease without esophagitis   [facility], Jodell Cipro, PA-C   6 months ago Affective disorder [facility])   [facility] [place], [place], [place]   11 months ago Bipolar 2 disorder [facility])   [facility] [place], [place], [place]   12 months ago Bipolar 2 disorder [facility])   [facility] [place], [place]andra [place], [place]   1 year ago Affective disorder [facility])   [facility] [place], [place], [place]

## 2020-09-19 NOTE — Telephone Encounter (Signed)
Pt called in to request a refill for amphetamine-dextroamphetamine (ADDERALL) 10 MG tablet. Pt was told to reach out to PCP for refill.      Pharmacy:  [facility] [address], [place] - [place] [address] Phone:  [phone]  Fax:  [phone]

## 2020-09-21 MED ORDER — AMPHETAMINE-DEXTROAMPHETAMINE 10 MG PO TABS: 5.0000 mg | ORAL_TABLET | Freq: Every day | ORAL | 0 refills | Status: DC

## 2020-09-24 ENCOUNTER — Telehealth: Payer: Self-pay | Admitting: Physician Assistant

## 2020-09-24 ENCOUNTER — Other Ambulatory Visit: Payer: Self-pay

## 2020-09-24 DIAGNOSIS — F900 Attention-deficit hyperactivity disorder, predominantly inattentive type: Secondary | ICD-10-CM

## 2020-09-24 NOTE — Telephone Encounter (Signed)
Copied from CRM [id number]. Topic: Quick Communication - Rx Refill/Question >> Sep 24, 2020  2:54 PM Gaetana Michaelis A wrote: Medication: amphetamine-dextroamphetamine (ADDERALL) 10 MG tablet   Has the patient contacted their pharmacy? Yes.   (Agent: If no, request that the patient contact the pharmacy for the refill.) (Agent: If yes, when and what did the pharmacy advise?)  Preferred Pharmacy (with phone number or street name): [facility] [address], [place] - [place] [address]  Phone:  [phone] Fax:  [phone]  Agent: Please be advised that RX refills may take up to 3 business days. We ask that you follow-up with your pharmacy.

## 2020-09-24 NOTE — Telephone Encounter (Signed)
[facility] faxed refill request for the following medications:   amphetamine-dextroamphetamine (ADDERALL) 10 MG tablet    Please advise.

## 2020-09-24 NOTE — Telephone Encounter (Signed)
   Notes to clinic:  PA needed    Requested Prescriptions  Pending Prescriptions Disp Refills   amphetamine-dextroamphetamine (ADDERALL) 10 MG tablet 30 tablet 0    Sig: Take 0.5 tablets (5 mg total) by mouth daily with breakfast.      There is no refill protocol information for this order

## 2020-09-24 NOTE — Telephone Encounter (Signed)
Order has been sent to provider

## 2020-09-25 ENCOUNTER — Other Ambulatory Visit: Payer: Self-pay | Admitting: Family Medicine

## 2020-09-25 MED ORDER — AMPHETAMINE-DEXTROAMPHETAMINE 10 MG PO TABS: 5.0000 mg | ORAL_TABLET | Freq: Every day | ORAL | 0 refills | Status: AC

## 2020-09-25 NOTE — Telephone Encounter (Signed)
Pt calling in regarding this prescription. She states that she is needing to have this sent to new pharmacy as a 30 day supply in order for her insurance to cover it. Please advise.      [id number] - [place], [place] - [address]  [address] [place] [address]  Phone: [phone] Fax: [phone]  Hours: Not open 24 hours

## 2020-09-27 NOTE — Telephone Encounter (Signed)
Spoke with [facility] in [place] regarding them not filling the generic Adderall.    Joe, the pharmacist, said it had to have a prior authorization.

## 2020-09-27 NOTE — Telephone Encounter (Signed)
Pt calling in stating that the pharmacy is not able to get her medication due to them not having proof of office accepting her medicaid. She states that are needing proof before they are able to give it to her with [place]. Please advise.

## 2020-10-02 NOTE — Telephone Encounter (Signed)
PA done today. Outcome: Pending. 

## 2020-10-11 ENCOUNTER — Telehealth: Payer: Medicaid Other | Admitting: Family Medicine

## 2021-02-08 ENCOUNTER — Other Ambulatory Visit: Payer: Self-pay | Admitting: Physician Assistant

## 2021-02-08 DIAGNOSIS — F3181 Bipolar II disorder: Secondary | ICD-10-CM

## 2021-02-08 DIAGNOSIS — F39 Unspecified mood [affective] disorder: Secondary | ICD-10-CM

## 2021-02-12 ENCOUNTER — Telehealth: Payer: Self-pay | Admitting: Physician Assistant

## 2021-02-12 ENCOUNTER — Other Ambulatory Visit: Payer: Self-pay

## 2021-02-12 DIAGNOSIS — F3181 Bipolar II disorder: Secondary | ICD-10-CM

## 2021-02-12 NOTE — Telephone Encounter (Signed)
[facility] faxed refill request for the following medications:  escitalopram (LEXAPRO) 20 MG tablet   Please advise.

## 2021-02-14 MED ORDER — ESCITALOPRAM OXALATE 20 MG PO TABS: 20.0000 mg | ORAL_TABLET | Freq: Every day | ORAL | 1 refills | Status: AC

## 2021-02-19 ENCOUNTER — Telehealth: Payer: Self-pay | Admitting: Physician Assistant

## 2021-02-19 DIAGNOSIS — K219 Gastro-esophageal reflux disease without esophagitis: Secondary | ICD-10-CM

## 2021-02-19 MED ORDER — FAMOTIDINE 40 MG PO TABS: 40.0000 mg | ORAL_TABLET | Freq: Every day | ORAL | 1 refills | Status: AC

## 2021-02-19 NOTE — Telephone Encounter (Signed)
[facility] faxed refill request for the following medications:  famotidine (PEPCID) 40 MG tablet   Please advise.

## 2022-03-27 ENCOUNTER — Other Ambulatory Visit: Payer: Self-pay | Admitting: Family Medicine

## 2022-03-27 DIAGNOSIS — K219 Gastro-esophageal reflux disease without esophagitis: Secondary | ICD-10-CM

## 2022-03-27 NOTE — Telephone Encounter (Signed)
Called patient to schedule appt , new patient appt , medication refills. No answer VM box full unable to leave message.

## 2022-03-27 NOTE — Telephone Encounter (Signed)
Requested medication (s) are due for refill today: expired medication  Requested medication (s) are on the active medication list: yes  Last refill:  02/19/21/#90 0 refills  Future visit scheduled: no  Notes to clinic:  expired medication . Do you want to renew Rx? Called patient to schedule appt for new pt medication refills, no answer unable to leave message. Please advise      Requested Prescriptions  Pending Prescriptions Disp Refills   famotidine (PEPCID) 40 MG tablet [Pharmacy Med Name: Famotidine 40 MG Oral Tablet] 90 tablet 0    Sig: Take 1 tablet by mouth once daily     Gastroenterology:  H2 Antagonists Failed - 03/27/2022  9:56 AM      Failed - Valid encounter within last 12 months    Recent Outpatient Visits           1 year ago Gastroesophageal reflux disease without esophagitis   [place], PA-C   2 years ago Affective disorder [facility])   [place], [place], [place]   2 years ago Bipolar 2 disorder [facility])   [place], [place], [place]   2 years ago Bipolar 2 disorder [facility])   [place], [place], [place]   2 years ago Affective disorder [facility])   [place], [place], [place]

## 2023-10-20 ENCOUNTER — Other Ambulatory Visit (HOSPITAL_COMMUNITY): Payer: Self-pay
